# Patient Record
Sex: Female | Born: 1945
Health system: Southern US, Community
[De-identification: ages and names within clinical notes are randomized; demographics above are authoritative.]

## PROBLEM LIST (undated history)

## (undated) DIAGNOSIS — I10 Essential (primary) hypertension: Secondary | ICD-10-CM

## (undated) DIAGNOSIS — E785 Hyperlipidemia, unspecified: Secondary | ICD-10-CM

## (undated) DIAGNOSIS — F419 Anxiety disorder, unspecified: Secondary | ICD-10-CM

## (undated) DIAGNOSIS — I5189 Other ill-defined heart diseases: Secondary | ICD-10-CM

## (undated) HISTORY — DX: Other ill-defined heart diseases: I51.89

## (undated) HISTORY — PX: APPENDECTOMY: SHX54

## (undated) HISTORY — DX: Hyperlipidemia, unspecified: E78.5

## (undated) HISTORY — PX: CATARACT EXTRACTION: SUR2

## (undated) HISTORY — DX: Anxiety disorder, unspecified: F41.9

## (undated) HISTORY — DX: Morbid (severe) obesity due to excess calories: E66.01

---

## 2000-12-28 ENCOUNTER — Other Ambulatory Visit: Admission: RE | Admit: 2000-12-28 | Discharge: 2000-12-28 | Payer: Self-pay | Admitting: Family Medicine

## 2001-09-26 ENCOUNTER — Encounter: Payer: Self-pay | Admitting: Cardiology

## 2002-07-22 ENCOUNTER — Encounter: Payer: Self-pay | Admitting: Cardiology

## 2004-07-07 ENCOUNTER — Ambulatory Visit: Payer: Self-pay | Admitting: Family Medicine

## 2004-07-22 ENCOUNTER — Ambulatory Visit: Payer: Self-pay | Admitting: Urology

## 2004-08-18 HISTORY — PX: ABDOMINAL HYSTERECTOMY: SHX81

## 2004-09-28 ENCOUNTER — Ambulatory Visit: Payer: Self-pay | Admitting: Gynecologic Oncology

## 2005-02-10 ENCOUNTER — Ambulatory Visit: Payer: Self-pay | Admitting: Family Medicine

## 2005-10-10 ENCOUNTER — Ambulatory Visit: Payer: Self-pay | Admitting: Family Medicine

## 2007-01-01 ENCOUNTER — Ambulatory Visit: Payer: Self-pay | Admitting: Family Medicine

## 2008-01-09 ENCOUNTER — Ambulatory Visit: Payer: Self-pay | Admitting: Family Medicine

## 2009-01-25 ENCOUNTER — Ambulatory Visit: Payer: Self-pay | Admitting: Family Medicine

## 2009-03-02 LAB — HM DEXA SCAN

## 2009-03-22 ENCOUNTER — Ambulatory Visit: Payer: Self-pay | Admitting: Family Medicine

## 2009-06-16 ENCOUNTER — Ambulatory Visit: Payer: Self-pay | Admitting: Family Medicine

## 2009-06-17 ENCOUNTER — Ambulatory Visit: Payer: Self-pay | Admitting: Family Medicine

## 2010-04-07 ENCOUNTER — Ambulatory Visit: Payer: Self-pay | Admitting: Family Medicine

## 2013-08-26 LAB — HM COLONOSCOPY

## 2014-03-12 LAB — HEPATIC FUNCTION PANEL
ALK PHOS: 77 U/L (ref 25–125)
ALT: 23 U/L (ref 7–35)
AST: 25 U/L (ref 13–35)

## 2014-03-12 LAB — CBC AND DIFFERENTIAL
HCT: 44 % (ref 36–46)
Hemoglobin: 14.8 g/dL (ref 12.0–16.0)
Neutrophils Absolute: 4 /uL
PLATELETS: 264 10*3/uL (ref 150–399)
WBC: 8 10*3/mL

## 2014-03-12 LAB — LIPID PANEL
Cholesterol: 192 mg/dL (ref 0–200)
HDL: 50 mg/dL (ref 35–70)
LDL CALC: 101 mg/dL
LDl/HDL Ratio: 2
Triglycerides: 207 mg/dL — AB (ref 40–160)

## 2014-03-12 LAB — BASIC METABOLIC PANEL
BUN: 15 mg/dL (ref 4–21)
CREATININE: 1.1 mg/dL (ref 0.5–1.1)
GLUCOSE: 92 mg/dL
Potassium: 4.8 mmol/L (ref 3.4–5.3)
Sodium: 142 mmol/L (ref 137–147)

## 2014-03-12 LAB — TSH: TSH: 1.88 u[IU]/mL (ref 0.41–5.90)

## 2014-07-15 DIAGNOSIS — Z23 Encounter for immunization: Secondary | ICD-10-CM | POA: Diagnosis not present

## 2014-07-15 DIAGNOSIS — Z0000 Encounter for general adult medical examination without abnormal findings: Secondary | ICD-10-CM | POA: Diagnosis not present

## 2014-08-25 ENCOUNTER — Other Ambulatory Visit: Payer: Self-pay | Admitting: Family Medicine

## 2014-08-25 DIAGNOSIS — R011 Cardiac murmur, unspecified: Secondary | ICD-10-CM

## 2014-08-31 ENCOUNTER — Ambulatory Visit
Admission: RE | Admit: 2014-08-31 | Discharge: 2014-08-31 | Disposition: A | Discharge status: Home / Self Care | Payer: Medicare Other | Source: Ambulatory Visit | Attending: Family Medicine | Admitting: Family Medicine

## 2014-08-31 DIAGNOSIS — R011 Cardiac murmur, unspecified: Secondary | ICD-10-CM | POA: Insufficient documentation

## 2014-08-31 NOTE — Progress Notes (Signed)
*  PRELIMINARY RESULTS* Echocardiogram 2D Echocardiogram has been performed.  Latoya Cordova 08/31/2014, 12:25 PM

## 2014-09-01 DIAGNOSIS — R011 Cardiac murmur, unspecified: Secondary | ICD-10-CM | POA: Diagnosis not present

## 2014-09-02 ENCOUNTER — Telehealth: Payer: Self-pay

## 2014-09-02 NOTE — Telephone Encounter (Signed)
Tried calling, no answer and then busy signal-aa

## 2014-09-02 NOTE — Telephone Encounter (Signed)
-----   Message from Maple Hudsonichard L Gilbert Jr., MD sent at 09/02/2014  9:30 AM EDT ----- Mild mitral regurgitation==normal Echo.

## 2014-09-04 NOTE — Telephone Encounter (Signed)
Patient advised of results.

## 2014-09-24 ENCOUNTER — Other Ambulatory Visit: Payer: Self-pay | Admitting: Family Medicine

## 2014-12-03 ENCOUNTER — Other Ambulatory Visit: Payer: Self-pay | Admitting: Family Medicine

## 2014-12-22 ENCOUNTER — Other Ambulatory Visit: Payer: Self-pay

## 2014-12-22 MED ORDER — QUINAPRIL HCL 40 MG PO TABS: 40.0000 mg | ORAL_TABLET | Freq: Every day | ORAL | Status: DC

## 2015-01-02 ENCOUNTER — Other Ambulatory Visit: Payer: Self-pay | Admitting: Family Medicine

## 2015-01-12 DIAGNOSIS — E669 Obesity, unspecified: Secondary | ICD-10-CM | POA: Insufficient documentation

## 2015-01-12 DIAGNOSIS — F411 Generalized anxiety disorder: Secondary | ICD-10-CM | POA: Insufficient documentation

## 2015-01-12 DIAGNOSIS — J309 Allergic rhinitis, unspecified: Secondary | ICD-10-CM | POA: Insufficient documentation

## 2015-01-12 DIAGNOSIS — Z79890 Hormone replacement therapy (postmenopausal): Secondary | ICD-10-CM | POA: Insufficient documentation

## 2015-01-12 DIAGNOSIS — M8580 Other specified disorders of bone density and structure, unspecified site: Secondary | ICD-10-CM | POA: Insufficient documentation

## 2015-01-12 DIAGNOSIS — I10 Essential (primary) hypertension: Secondary | ICD-10-CM | POA: Insufficient documentation

## 2015-01-12 DIAGNOSIS — M797 Fibromyalgia: Secondary | ICD-10-CM | POA: Insufficient documentation

## 2015-01-12 DIAGNOSIS — R011 Cardiac murmur, unspecified: Secondary | ICD-10-CM | POA: Insufficient documentation

## 2015-01-12 DIAGNOSIS — F32A Depression, unspecified: Secondary | ICD-10-CM | POA: Insufficient documentation

## 2015-01-12 DIAGNOSIS — E785 Hyperlipidemia, unspecified: Secondary | ICD-10-CM | POA: Insufficient documentation

## 2015-01-12 DIAGNOSIS — K219 Gastro-esophageal reflux disease without esophagitis: Secondary | ICD-10-CM | POA: Insufficient documentation

## 2015-01-12 DIAGNOSIS — E039 Hypothyroidism, unspecified: Secondary | ICD-10-CM | POA: Insufficient documentation

## 2015-01-12 DIAGNOSIS — F329 Major depressive disorder, single episode, unspecified: Secondary | ICD-10-CM | POA: Insufficient documentation

## 2015-01-12 DIAGNOSIS — M1990 Unspecified osteoarthritis, unspecified site: Secondary | ICD-10-CM | POA: Insufficient documentation

## 2015-01-13 ENCOUNTER — Encounter: Payer: Self-pay | Admitting: Family Medicine

## 2015-01-13 ENCOUNTER — Ambulatory Visit (INDEPENDENT_AMBULATORY_CARE_PROVIDER_SITE_OTHER): Payer: Medicare Other | Admitting: Family Medicine

## 2015-01-13 VITALS — BP 122/80 | HR 92 | Temp 97.3°F | Resp 16 | Wt 207.0 lb

## 2015-01-13 DIAGNOSIS — F411 Generalized anxiety disorder: Secondary | ICD-10-CM | POA: Diagnosis not present

## 2015-01-13 DIAGNOSIS — R51 Headache: Secondary | ICD-10-CM | POA: Diagnosis not present

## 2015-01-13 DIAGNOSIS — I10 Essential (primary) hypertension: Secondary | ICD-10-CM | POA: Diagnosis not present

## 2015-01-13 DIAGNOSIS — E039 Hypothyroidism, unspecified: Secondary | ICD-10-CM | POA: Diagnosis not present

## 2015-01-13 DIAGNOSIS — K219 Gastro-esophageal reflux disease without esophagitis: Secondary | ICD-10-CM | POA: Diagnosis not present

## 2015-01-13 DIAGNOSIS — F329 Major depressive disorder, single episode, unspecified: Secondary | ICD-10-CM

## 2015-01-13 DIAGNOSIS — E785 Hyperlipidemia, unspecified: Secondary | ICD-10-CM | POA: Diagnosis not present

## 2015-01-13 DIAGNOSIS — G8929 Other chronic pain: Secondary | ICD-10-CM

## 2015-01-13 DIAGNOSIS — F32A Depression, unspecified: Secondary | ICD-10-CM

## 2015-01-13 MED ORDER — PAROXETINE HCL 40 MG PO TABS: 40.0000 mg | ORAL_TABLET | Freq: Every day | ORAL | Status: DC

## 2015-01-13 MED ORDER — BUTALBITAL-ASPIRIN-CAFFEINE 50-325-40 MG PO CAPS: 1.0000 | ORAL_CAPSULE | Freq: Two times a day (BID) | ORAL | Status: DC | PRN

## 2015-01-13 MED ORDER — ESOMEPRAZOLE MAGNESIUM 40 MG PO CPDR: 40.0000 mg | DELAYED_RELEASE_CAPSULE | Freq: Every day | ORAL | Status: DC

## 2015-01-13 MED ORDER — LEVOTHYROXINE SODIUM 25 MCG PO TABS: 25.0000 ug | ORAL_TABLET | Freq: Every day | ORAL | Status: DC

## 2015-01-13 MED ORDER — QUINAPRIL HCL 40 MG PO TABS: 40.0000 mg | ORAL_TABLET | Freq: Every day | ORAL | Status: DC

## 2015-01-13 MED ORDER — LORAZEPAM 1 MG PO TABS: 1.0000 mg | ORAL_TABLET | Freq: Every day | ORAL | Status: DC

## 2015-01-13 MED ORDER — HYDROCODONE-ACETAMINOPHEN 5-325 MG PO TABS: 1.0000 | ORAL_TABLET | Freq: Every day | ORAL | Status: DC | PRN

## 2015-01-13 MED ORDER — HYDROCHLOROTHIAZIDE 25 MG PO TABS: 25.0000 mg | ORAL_TABLET | Freq: Every day | ORAL | Status: DC

## 2015-01-13 NOTE — Progress Notes (Signed)
Patient ID: Latoya Cordova, female   DOB: 29-Oct-1945, 69 y.o.   MRN: [id number]    Subjective:  HPI  Hypertension, follow-up:  BP Readings from Last 3 Encounters:  01/13/15 122/80  07/15/14 138/80    She was last seen for hypertension 6 months ago.  BP at that visit was 138/80. Management since that visit includes none. She reports good compliance with treatment. She is not having side effects.  She is not exercising. She is adherent to low salt diet.   Outside blood pressures are not being checked . Patient denies chest pain, chest pressure/discomfort, dyspnea, exertional chest pressure/discomfort, irregular heart beat and palpitations.     Wt Readings from Last 3 Encounters:  01/13/15 207 lb (93.895 kg)  07/15/14 211 lb (95.709 kg)    ------------------------------------------------------------------------  Last routine  labs 03/12/14     Prior to Admission medications   Medication Sig Start Date End Date Taking? Authorizing Provider  butalbital-aspirin-caffeine [facility]) [phone] MG capsule Take by mouth. 07/15/14  Yes Historical Provider, MD  esomeprazole (NEXIUM) 40 MG capsule Take by mouth. 07/02/12  Yes Historical Provider, MD  hydrochlorothiazide (HYDRODIURIL) 25 MG tablet take 1 tablet by mouth once daily 12/03/14  Yes Antolin Belsito Hulen Shouts., MD  HYDROcodone-acetaminophen (NORCO/VICODIN) 5-325 MG tablet Take by mouth. 07/15/14  Yes Historical Provider, MD  levothyroxine (SYNTHROID, LEVOTHROID) 25 MCG tablet take 1 tablet by mouth once daily 01/04/15  Yes Issac Moure Hulen Shouts., MD  LORazepam (ATIVAN) 1 MG tablet take 1 tablet by mouth at bedtime 01/04/15  Yes Maple Hudson., MD  PARoxetine (PAXIL) 40 MG tablet take 1 tablet by mouth once daily 01/04/15  Yes Orlanda Frankum Hulen Shouts., MD  quinapril (ACCUPRIL) 40 MG tablet Take 1 tablet (40 mg total) by mouth daily. 12/22/14  Yes Rayne Cowdrey Hulen Shouts., MD    Patient Active Problem List   Diagnosis Date Noted  .  Allergic rhinitis 01/12/2015  . Clinical depression 01/12/2015  . Essential (primary) hypertension 01/12/2015  . Anxiety, generalized 01/12/2015  . Fibrositis 01/12/2015  . Acid reflux 01/12/2015  . Need for prophylactic hormone replacement therapy (postmenopausal) 01/12/2015  . HLD (hyperlipidemia) 01/12/2015  . Adult hypothyroidism 01/12/2015  . Arthritis, degenerative 01/12/2015  . Cardiac murmur 01/12/2015  . Osteopenia 01/12/2015  . Adiposity 01/12/2015    History reviewed. No pertinent past medical history.  Social History   Social History  . Marital Status: Widowed    Spouse Name: N/A  . Number of Children: N/A  . Years of Education: N/A   Occupational History  . Not on file.   Social History Main Topics  . Smoking status: Never Smoker   . Smokeless tobacco: Not on file  . Alcohol Use: No  . Drug Use: No  . Sexual Activity: Not on file   Other Topics Concern  . Not on file   Social History Narrative    Allergies  Allergen Reactions  . Sulfa Antibiotics     Per Mother  . Lodine  [Etodolac] Rash    Abdominal rash.    Review of Systems  Constitutional: Negative.   HENT: Negative.   Eyes: Negative.   Respiratory: Negative.   Cardiovascular: Negative.   Gastrointestinal: Negative.   Genitourinary: Negative.   Musculoskeletal: Negative.   Skin: Negative.   Neurological: Negative.   Endo/Heme/Allergies: Negative.   Psychiatric/Behavioral: Negative.     Immunization History  Administered Date(s) Administered  . Influenza-Unspecified 02/24/2013  . Pneumococcal Conjugate-13 08/08/2013  .  Pneumococcal Polysaccharide-23 05/24/2011  . Tdap 12/13/2006   Objective:  BP 122/80 mmHg  Pulse 92  Temp(Src) 97.3 F (36.3 C) (Oral)  Resp 16  Wt 207 lb (93.895 kg)  Physical Exam  Constitutional: She is oriented to person, place, and time and well-developed, well-nourished, and in no distress.  HENT:  Head: Normocephalic and atraumatic.  Right Ear:  External ear normal.  Left Ear: External ear normal.  Nose: Nose normal.  Eyes: Conjunctivae are normal.  Neck: Neck supple.  Cardiovascular: Normal rate, regular rhythm and normal heart sounds.   Pulmonary/Chest: Effort normal and breath sounds normal.  Abdominal: Soft.  Neurological: She is alert and oriented to person, place, and time.  Skin: Skin is warm and dry.  Psychiatric: Mood, memory, affect and judgment normal.    Lab Results  Component Value Date   WBC 8.0 03/12/2014   HGB 14.8 03/12/2014   HCT 44 03/12/2014   PLT 264 03/12/2014   CHOL 192 03/12/2014   TRIG 207* 03/12/2014   HDL 50 03/12/2014   LDLCALC 101 03/12/2014   TSH 1.88 03/12/2014    CMP     Component Value Date/Time   NA 142 03/12/2014   K 4.8 03/12/2014   BUN 15 03/12/2014   CREATININE 1.1 03/12/2014   AST 25 03/12/2014   ALT 23 03/12/2014   ALKPHOS 77 03/12/2014    Assessment and Plan :  1. Essential (primary) hypertension  - quinapril (ACCUPRIL) 40 MG tablet; Take 1 tablet (40 mg total) by mouth daily.  Dispense: 30 tablet; Refill: 12 - hydrochlorothiazide (HYDRODIURIL) 25 MG tablet; Take 1 tablet (25 mg total) by mouth daily.  Dispense: 30 tablet; Refill: 12 - CBC with Differential/Platelet - TSH  2. Hypothyroidism, unspecified hypothyroidism type  - levothyroxine (SYNTHROID, LEVOTHROID) 25 MCG tablet; Take 1 tablet (25 mcg total) by mouth daily.  Dispense: 30 tablet; Refill: 12  3. Clinical depression  - PARoxetine (PAXIL) 40 MG tablet; Take 1 tablet (40 mg total) by mouth daily.  Dispense: 30 tablet; Refill: 12  4. Anxiety, generalized  - LORazepam (ATIVAN) 1 MG tablet; Take 1 tablet (1 mg total) by mouth at bedtime.  Dispense: 30 tablet; Refill: 5  5. HLD (hyperlipidemia)  - Lipid Panel With LDL/HDL Ratio - Comprehensive metabolic panel  6. Gastroesophageal reflux disease, esophagitis presence not specified  - esomeprazole (NEXIUM) 40 MG capsule; Take 1 capsule (40 mg total)  by mouth daily.  Dispense: 30 capsule; Refill: 12  7. Chronic nonintractable headache, unspecified headache type  - butalbital-aspirin-caffeine (FIORINAL) 50-325-40 MG capsule; Take 1 capsule by mouth 2 (two) times daily as needed for headache.  Dispense: 55 capsule; Refill: 5 - HYDROcodone-acetaminophen (NORCO/VICODIN) 5-325 MG tablet; Take 1-2 tablets by mouth daily as needed for moderate pain.  Dispense: 75 tablet; Refill: 0  I have done the exam and reviewed the above chart and it is accurate to the best of my knowledge.   Julieanne Mansonichard Asiana Benninger MD [facility] 01/13/2015 3:01 PM

## 2015-01-19 DIAGNOSIS — I10 Essential (primary) hypertension: Secondary | ICD-10-CM | POA: Diagnosis not present

## 2015-01-19 DIAGNOSIS — E785 Hyperlipidemia, unspecified: Secondary | ICD-10-CM | POA: Diagnosis not present

## 2015-01-20 LAB — COMPREHENSIVE METABOLIC PANEL
ALK PHOS: 79 IU/L (ref 39–117)
ALT: 27 IU/L (ref 0–32)
AST: 32 IU/L (ref 0–40)
Albumin/Globulin Ratio: 1.3 (ref 1.1–2.5)
Albumin: 4.7 g/dL (ref 3.5–4.8)
BILIRUBIN TOTAL: 0.5 mg/dL (ref 0.0–1.2)
BUN/Creatinine Ratio: 20 (ref 11–26)
BUN: 18 mg/dL (ref 8–27)
CHLORIDE: 94 mmol/L — AB (ref 97–106)
CO2: 23 mmol/L (ref 18–29)
CREATININE: 0.89 mg/dL (ref 0.57–1.00)
Calcium: 9.7 mg/dL (ref 8.7–10.3)
GFR calc Af Amer: 76 mL/min/{1.73_m2} (ref >59)
GFR calc non Af Amer: 66 mL/min/{1.73_m2} (ref >59)
GLUCOSE: 103 mg/dL — AB (ref 65–99)
Globulin, Total: 3.5 g/dL (ref 1.5–4.5)
Potassium: 4.6 mmol/L (ref 3.5–5.2)
Sodium: 139 mmol/L (ref 136–144)
TOTAL PROTEIN: 8.2 g/dL (ref 6.0–8.5)

## 2015-01-20 LAB — CBC WITH DIFFERENTIAL/PLATELET
Basophils Absolute: 0 10*3/uL (ref 0.0–0.2)
Basos: 0 %
EOS (ABSOLUTE): 0.2 10*3/uL (ref 0.0–0.4)
EOS: 2 %
HEMATOCRIT: 43.8 % (ref 34.0–46.6)
Hemoglobin: 15.1 g/dL (ref 11.1–15.9)
IMMATURE GRANS (ABS): 0 10*3/uL (ref 0.0–0.1)
IMMATURE GRANULOCYTES: 0 %
LYMPHS: 48 %
Lymphocytes Absolute: 5.3 10*3/uL — ABNORMAL HIGH (ref 0.7–3.1)
MCH: 31.9 pg (ref 26.6–33.0)
MCHC: 34.5 g/dL (ref 31.5–35.7)
MCV: 92 fL (ref 79–97)
Monocytes Absolute: 0.7 10*3/uL (ref 0.1–0.9)
Monocytes: 6 %
NEUTROS PCT: 44 %
Neutrophils Absolute: 5 10*3/uL (ref 1.4–7.0)
PLATELETS: 289 10*3/uL (ref 150–379)
RBC: 4.74 x10E6/uL (ref 3.77–5.28)
RDW: 13.4 % (ref 12.3–15.4)
WBC: 11.3 10*3/uL — AB (ref 3.4–10.8)

## 2015-01-20 LAB — LIPID PANEL WITH LDL/HDL RATIO
Cholesterol, Total: 196 mg/dL (ref 100–199)
HDL: 44 mg/dL (ref >39)
LDL Calculated: 106 mg/dL — ABNORMAL HIGH (ref 0–99)
LDl/HDL Ratio: 2.4 ratio units (ref 0.0–3.2)
TRIGLYCERIDES: 229 mg/dL — AB (ref 0–149)
VLDL CHOLESTEROL CAL: 46 mg/dL — AB (ref 5–40)

## 2015-01-20 LAB — TSH: TSH: 1.44 u[IU]/mL (ref 0.450–4.500)

## 2015-04-07 ENCOUNTER — Emergency Department: Payer: PPO

## 2015-04-07 ENCOUNTER — Other Ambulatory Visit: Payer: Self-pay

## 2015-04-07 ENCOUNTER — Encounter: Payer: Self-pay | Admitting: Urgent Care

## 2015-04-07 ENCOUNTER — Inpatient Hospital Stay
Admission: EM | Admit: 2015-04-07 | Discharge: 2015-04-11 | DRG: 193 | Disposition: A | Discharge status: Home / Self Care | Payer: PPO | Attending: Internal Medicine | Admitting: Internal Medicine

## 2015-04-07 DIAGNOSIS — R0602 Shortness of breath: Secondary | ICD-10-CM | POA: Insufficient documentation

## 2015-04-07 DIAGNOSIS — Z888 Allergy status to other drugs, medicaments and biological substances status: Secondary | ICD-10-CM | POA: Diagnosis not present

## 2015-04-07 DIAGNOSIS — J9621 Acute and chronic respiratory failure with hypoxia: Secondary | ICD-10-CM | POA: Diagnosis not present

## 2015-04-07 DIAGNOSIS — I10 Essential (primary) hypertension: Secondary | ICD-10-CM | POA: Diagnosis present

## 2015-04-07 DIAGNOSIS — M797 Fibromyalgia: Secondary | ICD-10-CM | POA: Diagnosis not present

## 2015-04-07 DIAGNOSIS — I509 Heart failure, unspecified: Secondary | ICD-10-CM | POA: Diagnosis not present

## 2015-04-07 DIAGNOSIS — E871 Hypo-osmolality and hyponatremia: Secondary | ICD-10-CM | POA: Diagnosis present

## 2015-04-07 DIAGNOSIS — I5031 Acute diastolic (congestive) heart failure: Secondary | ICD-10-CM | POA: Diagnosis not present

## 2015-04-07 DIAGNOSIS — J9601 Acute respiratory failure with hypoxia: Secondary | ICD-10-CM | POA: Diagnosis not present

## 2015-04-07 DIAGNOSIS — Z6837 Body mass index (BMI) 37.0-37.9, adult: Secondary | ICD-10-CM | POA: Diagnosis not present

## 2015-04-07 DIAGNOSIS — R0789 Other chest pain: Secondary | ICD-10-CM | POA: Diagnosis not present

## 2015-04-07 DIAGNOSIS — Z79899 Other long term (current) drug therapy: Secondary | ICD-10-CM | POA: Diagnosis not present

## 2015-04-07 DIAGNOSIS — R0600 Dyspnea, unspecified: Secondary | ICD-10-CM | POA: Diagnosis not present

## 2015-04-07 DIAGNOSIS — Z803 Family history of malignant neoplasm of breast: Secondary | ICD-10-CM | POA: Diagnosis not present

## 2015-04-07 DIAGNOSIS — Z8249 Family history of ischemic heart disease and other diseases of the circulatory system: Secondary | ICD-10-CM

## 2015-04-07 DIAGNOSIS — Z882 Allergy status to sulfonamides status: Secondary | ICD-10-CM

## 2015-04-07 DIAGNOSIS — E039 Hypothyroidism, unspecified: Secondary | ICD-10-CM | POA: Diagnosis not present

## 2015-04-07 DIAGNOSIS — Z90710 Acquired absence of both cervix and uterus: Secondary | ICD-10-CM | POA: Diagnosis not present

## 2015-04-07 DIAGNOSIS — F329 Major depressive disorder, single episode, unspecified: Secondary | ICD-10-CM | POA: Diagnosis not present

## 2015-04-07 DIAGNOSIS — R0902 Hypoxemia: Secondary | ICD-10-CM | POA: Diagnosis not present

## 2015-04-07 DIAGNOSIS — F411 Generalized anxiety disorder: Secondary | ICD-10-CM | POA: Diagnosis present

## 2015-04-07 DIAGNOSIS — Z9049 Acquired absence of other specified parts of digestive tract: Secondary | ICD-10-CM

## 2015-04-07 DIAGNOSIS — J189 Pneumonia, unspecified organism: Principal | ICD-10-CM | POA: Diagnosis present

## 2015-04-07 DIAGNOSIS — K210 Gastro-esophageal reflux disease with esophagitis: Secondary | ICD-10-CM | POA: Diagnosis present

## 2015-04-07 DIAGNOSIS — R079 Chest pain, unspecified: Secondary | ICD-10-CM

## 2015-04-07 DIAGNOSIS — R51 Headache: Secondary | ICD-10-CM | POA: Diagnosis not present

## 2015-04-07 DIAGNOSIS — F418 Other specified anxiety disorders: Secondary | ICD-10-CM | POA: Diagnosis not present

## 2015-04-07 DIAGNOSIS — K219 Gastro-esophageal reflux disease without esophagitis: Secondary | ICD-10-CM | POA: Diagnosis not present

## 2015-04-07 HISTORY — DX: Essential (primary) hypertension: I10

## 2015-04-07 LAB — BASIC METABOLIC PANEL
Anion gap: 10 (ref 5–15)
BUN: 13 mg/dL (ref 6–20)
CO2: 27 mmol/L (ref 22–32)
CREATININE: 1.1 mg/dL — AB (ref 0.44–1.00)
Calcium: 9 mg/dL (ref 8.9–10.3)
Chloride: 101 mmol/L (ref 101–111)
GFR, EST AFRICAN AMERICAN: 58 mL/min — AB (ref >60)
GFR, EST NON AFRICAN AMERICAN: 50 mL/min — AB (ref >60)
Glucose, Bld: 139 mg/dL — ABNORMAL HIGH (ref 65–99)
POTASSIUM: 3.7 mmol/L (ref 3.5–5.1)
SODIUM: 138 mmol/L (ref 135–145)

## 2015-04-07 LAB — CBC
HEMATOCRIT: 44.9 % (ref 35.0–47.0)
Hemoglobin: 14.8 g/dL (ref 12.0–16.0)
MCH: 29.8 pg (ref 26.0–34.0)
MCHC: 32.9 g/dL (ref 32.0–36.0)
MCV: 90.3 fL (ref 80.0–100.0)
PLATELETS: 256 10*3/uL (ref 150–440)
RBC: 4.97 MIL/uL (ref 3.80–5.20)
RDW: 12.9 % (ref 11.5–14.5)
WBC: 16.8 10*3/uL — AB (ref 3.6–11.0)

## 2015-04-07 LAB — TROPONIN I

## 2015-04-07 MED ORDER — IPRATROPIUM-ALBUTEROL 0.5-2.5 (3) MG/3ML IN SOLN
3.0000 mL | Freq: Once | RESPIRATORY_TRACT | Status: AC
  Administered 2015-04-08: 3 mL via RESPIRATORY_TRACT
  Filled 2015-04-07 (×2): qty 3

## 2015-04-07 NOTE — ED Provider Notes (Signed)
[facility] Emergency Department Provider Note  ____________________________________________  Time seen: 11:45 PM  I have reviewed the triage vital signs and the nursing notes.   HISTORY  Chief Complaint Chest Pain      HPI Latoya Cordova is a 70 y.o. female presents with acute onset of central chest pain and bilateral shoulder pain at 8:30 PM tonight. Patient also admits to shortness of breath with onset of chest pain. Patient denies any diaphoresis no cough vomiting or nausea.     History reviewed. No pertinent past medical history.  Patient Active Problem List   Diagnosis Date Noted  . Allergic rhinitis 01/12/2015  . Clinical depression 01/12/2015  . Essential (primary) hypertension 01/12/2015  . Anxiety, generalized 01/12/2015  . Fibrositis 01/12/2015  . Acid reflux 01/12/2015  . Need for prophylactic hormone replacement therapy (postmenopausal) 01/12/2015  . HLD (hyperlipidemia) 01/12/2015  . Adult hypothyroidism 01/12/2015  . Arthritis, degenerative 01/12/2015  . Cardiac murmur 01/12/2015  . Osteopenia 01/12/2015  . Adiposity 01/12/2015    Past Surgical History  Procedure Laterality Date  . Abdominal hysterectomy  [id number]  . Appendectomy      Current Outpatient Rx  Name  Route  Sig  Dispense  Refill  . butalbital-aspirin-caffeine (FIORINAL) 50-325-40 MG capsule   Oral   Take 1 capsule by mouth 2 (two) times daily as needed for headache.   55 capsule   5   . esomeprazole (NEXIUM) 40 MG capsule   Oral   Take 1 capsule (40 mg total) by mouth daily.   30 capsule   12   . hydrochlorothiazide (HYDRODIURIL) 25 MG tablet   Oral   Take 1 tablet (25 mg total) by mouth daily.   30 tablet   12     SAVINGS FOR NON-COVERED DRUGS -- [id number], PCN:  ...   . HYDROcodone-acetaminophen (NORCO/VICODIN) 5-325 MG tablet   Oral   Take 1-2 tablets by mouth daily as needed for moderate pain.   75 tablet   0   . levothyroxine  (SYNTHROID, LEVOTHROID) 25 MCG tablet   Oral   Take 1 tablet (25 mcg total) by mouth daily.   30 tablet   12   . LORazepam (ATIVAN) 1 MG tablet   Oral   Take 1 tablet (1 mg total) by mouth at bedtime.   30 tablet   5   . PARoxetine (PAXIL) 40 MG tablet   Oral   Take 1 tablet (40 mg total) by mouth daily.   30 tablet   12   . quinapril (ACCUPRIL) 40 MG tablet   Oral   Take 1 tablet (40 mg total) by mouth daily.   30 tablet   12     Allergies Sulfa antibiotics and Lodine   Family History  Problem Relation Age of Onset  . Ulcers Father   . Heart attack Paternal Grandfather   . Hypertension Mother   . Congestive Heart Failure Mother   . Breast cancer Maternal Aunt     Social History Social History  Substance Use Topics  . Smoking status: Never Smoker   . Smokeless tobacco: None  . Alcohol Use: No    Review of Systems  Constitutional: Negative for fever. Eyes: Negative for visual changes. ENT: Negative for sore throat. Cardiovascular:Positive for chest pain. Respiratory: positiveor shortness of breath. Gastrointestinal: Negative for abdominal pain, vomiting and diarrhea. Genitourinary: Negative for dysuria. Musculoskeletal: Negative for back pain. Skin: Negative for rash. Neurological: Negative for headaches, focal weakness  or numbness.   10-point ROS otherwise negative.  ____________________________________________   PHYSICAL EXAM:  VITAL SIGNS: ED Triage Vitals  Enc Vitals Group     BP 04/07/15 2226 121/64 mmHg     Pulse Rate 04/07/15 2226 116     Resp 04/07/15 2226 20     Temp 04/07/15 2226 98.1 F (36.7 C)     Temp Source 04/07/15 2226 Oral     SpO2 04/07/15 2226 92 %     Weight 04/07/15 2226 185 lb (83.915 kg)     Height 04/07/15 2226 5\' 2"  (1.575 m)     Head Cir --      Peak Flow --      Pain Score 04/07/15 2225 7     Pain Loc --      Pain Edu? --      Excl. in GC? --      Constitutional: AlertParent discomfort Conjunctivae are  normal. PERRL. Normal extraocular movements. ENT   Head: Normocephalic and atraumatic.   Nose: No congestion/rhinnorhea.   Mouth/Throat: Mucous membranes are moist.   Neck: No stridor. Hematological/Lymphatic/Immunilogical: No cervical lymphadenopathy. Cardiovascular: Normal rate, regular rhythm. Normal and symmetric distal pulses are present in all extremities. No murmurs, rubs, or gallops. Respiratory: Normal respiratory effort without tachypnea nor retractions. Breath sounds are clear and equal bilaterally. No wheezes/rales/rhonchi. Gastrointestinal: Soft and nontender. No distention. There is no CVA tenderness. Genitourinary: deferred Musculoskeletal: Nontender with normal range of motion in all extremities. No joint effusions.  No lower extremity tenderness nor edema. Neurologic:  Normal speech and language. No gross focal neurologic deficits are appreciated. Speech is normal.  Skin:  Skin is warm, dry and intact. No rash noted. Psychiatric: Mood and affect are normal. Speech and behavior are normal. Patient exhibits appropriate insight and judgment.  ____________________________________________    LABS (pertinent positives/negatives)  Labs Reviewed  BASIC METABOLIC PANEL - Abnormal; Notable for the following:    Glucose, Bld 139 (*)    Creatinine, Ser 1.10 (*)    GFR calc non Af Amer 50 (*)    GFR calc Af Amer 58 (*)    All other components within normal limits  CBC - Abnormal; Notable for the following:    WBC 16.8 (*)    All other components within normal limits  TROPONIN I     ____________________________________________   EKG  ED ECG REPORT I, Hassel Uphoff, [place] N, the attending physician, personally viewed and interpreted this ECG.   Date: 04/07/2015  EKG Time: 10:27 PM  Rate: 121  Rhythm: Sinus tachycardia  Axis: None  Intervals: Normal  ST&T Change:None   ____________________________________________    RADIOLOGY     CT Angio Chest PE  W/Cm &/Or Wo Cm (Final result) Result time: 04/08/15 01:36:01   Final result by Rad Results In Interface (04/08/15 01:36:01)   Narrative:   CLINICAL DATA: 70 year old female with dyspnea and chest pain  EXAM: CT ANGIOGRAPHY CHEST WITH CONTRAST  TECHNIQUE: Multidetector CT imaging of the chest was performed using the standard protocol during bolus administration of intravenous contrast. Multiplanar CT image reconstructions and MIPs were obtained to evaluate the vascular anatomy.  CONTRAST: 100mL OMNIPAQUE IOHEXOL 350 MG/ML SOLN  COMPARISON: Radiograph dated 04/07/2015  FINDINGS: Evaluation of this exam is limited due to respiratory motion artifact. Evaluation is also limited due to streak artifact caused by patient's arms.  There is mild emphysematous changes of the lungs. There is patchy areas of ground-glass opacities with mosaic appearance of the lungs compatible  with small airways versus small vessel disease. Pneumonia is less likely but not excluded. Clinical correlation is recommended. There is no focal consolidation, pleural effusion, or pneumothorax. The central airways are patent.  The thoracic aorta appears unremarkable. Evaluation of the pulmonary arteries is limited due to suboptimal visualization of the peripheral branches. No central pulmonary artery embolus identified. Top-normal cardiac size. No pericardial effusion. The esophagus is grossly unremarkable. No thyroid nodule.  There is no axillary adenopathy. The chest wall soft tissues appear unremarkable. There is degenerative changes of the spine. No acute fracture.  The visualized upper abdomen appears unremarkable.  Review of the MIP images confirms the above findings.  IMPRESSION: No definite CT evidence of central pulmonary embolus.  Diffuse airspace haziness with areas of air trapping likely within small airways versus small vessel disease. Clinical correlation  is recommended.   Electronically Signed By: Elgie Collard M.D. On: 04/08/2015 01:36          DG Chest 2 View (Final result) Result time: 04/07/15 23:27:04   Final result by Rad Results In Interface (04/07/15 23:27:04)   Narrative:   CLINICAL DATA: Chest pain, shortness of breath and palpitations. Onset this evening.  EXAM: CHEST 2 VIEW  COMPARISON: 06/17/2009  FINDINGS: Progressive cardiomegaly. Unchanged tortuosity of the thoracic aorta. Indistinctness of perihilar vasculature suggesting pulmonary edema. Bibasilar opacities may be atelectasis or vascular in etiology. No pleural effusion. No pneumothorax. No acute osseous abnormalities are seen.  IMPRESSION: Increase cardiomegaly with probable pulmonary edema. Findings suggest mild CHF.  Bibasilar opacities may be atelectasis or vascular in etiology.   Electronically Signed By: Rubye Oaks M.D. On: 04/07/2015 23:27          INITIAL IMPRESSION / ASSESSMENT AND PLAN / ED COURSE  Pertinent labs & imaging results that were available during my care of the patient were reviewed by me and considered in my medical decision making (see chart for details).  Patient received a DuoNeb nebulized breathing treatment as well as morphine and Zofran in the emergency department.  ____________________________________________   FINAL CLINICAL IMPRESSION(S) / ED DIAGNOSES  Final diagnoses:  Chest pain, unspecified chest pain type      Darci Current, MD 04/09/15 [phone]

## 2015-04-07 NOTE — ED Notes (Addendum)
Pt wheeled to room, but ambulatory to bed. Pt states pain started in back and shoulders, then came to chest. Pt is SOB with movement. Denies N&V, diaphoresis. Pt states she is only dizzy when she bends over, but otherwise no dizziness. Pt states pain began at 8:30p, states she was watching tv, denies doing anything out of the ordinary, states pain in abdomen earlier today. Pt states she took an Ativan about 7:30p and took 3 gas relief gel pills about 8p.

## 2015-04-07 NOTE — ED Notes (Addendum)
Patient presents with c/o pain that started in bilateral shoulders around 2030 tonight. Pain has progressed to retrosternal area at present. (+) SOB reported. Denies N/V and diaphoresis. Patient took an Ativan and 3 gas pills in efforts to control symptoms; reports that interventions were non-effective.

## 2015-04-08 ENCOUNTER — Telehealth: Payer: Self-pay | Admitting: Family Medicine

## 2015-04-08 ENCOUNTER — Inpatient Hospital Stay (HOSPITAL_COMMUNITY)
Admit: 2015-04-08 | Discharge: 2015-04-08 | Disposition: A | Discharge status: Home / Self Care | Payer: PPO | Attending: Physician Assistant | Admitting: Physician Assistant

## 2015-04-08 ENCOUNTER — Emergency Department: Payer: PPO

## 2015-04-08 ENCOUNTER — Encounter: Payer: Self-pay | Admitting: Radiology

## 2015-04-08 ENCOUNTER — Telehealth: Payer: Self-pay

## 2015-04-08 DIAGNOSIS — I509 Heart failure, unspecified: Secondary | ICD-10-CM | POA: Diagnosis not present

## 2015-04-08 DIAGNOSIS — R0789 Other chest pain: Secondary | ICD-10-CM

## 2015-04-08 DIAGNOSIS — F329 Major depressive disorder, single episode, unspecified: Secondary | ICD-10-CM | POA: Diagnosis not present

## 2015-04-08 DIAGNOSIS — Z803 Family history of malignant neoplasm of breast: Secondary | ICD-10-CM | POA: Diagnosis not present

## 2015-04-08 DIAGNOSIS — E871 Hypo-osmolality and hyponatremia: Secondary | ICD-10-CM | POA: Diagnosis not present

## 2015-04-08 DIAGNOSIS — Z6837 Body mass index (BMI) 37.0-37.9, adult: Secondary | ICD-10-CM | POA: Diagnosis not present

## 2015-04-08 DIAGNOSIS — R0902 Hypoxemia: Secondary | ICD-10-CM | POA: Diagnosis not present

## 2015-04-08 DIAGNOSIS — I10 Essential (primary) hypertension: Secondary | ICD-10-CM | POA: Diagnosis not present

## 2015-04-08 DIAGNOSIS — K219 Gastro-esophageal reflux disease without esophagitis: Secondary | ICD-10-CM | POA: Diagnosis not present

## 2015-04-08 DIAGNOSIS — R0602 Shortness of breath: Secondary | ICD-10-CM | POA: Diagnosis not present

## 2015-04-08 DIAGNOSIS — J189 Pneumonia, unspecified organism: Secondary | ICD-10-CM | POA: Diagnosis not present

## 2015-04-08 DIAGNOSIS — Z8249 Family history of ischemic heart disease and other diseases of the circulatory system: Secondary | ICD-10-CM | POA: Diagnosis not present

## 2015-04-08 DIAGNOSIS — M797 Fibromyalgia: Secondary | ICD-10-CM | POA: Insufficient documentation

## 2015-04-08 DIAGNOSIS — R0600 Dyspnea, unspecified: Secondary | ICD-10-CM | POA: Diagnosis not present

## 2015-04-08 DIAGNOSIS — F418 Other specified anxiety disorders: Secondary | ICD-10-CM | POA: Diagnosis not present

## 2015-04-08 DIAGNOSIS — E039 Hypothyroidism, unspecified: Secondary | ICD-10-CM | POA: Diagnosis not present

## 2015-04-08 DIAGNOSIS — I5031 Acute diastolic (congestive) heart failure: Secondary | ICD-10-CM

## 2015-04-08 DIAGNOSIS — R079 Chest pain, unspecified: Secondary | ICD-10-CM | POA: Diagnosis not present

## 2015-04-08 DIAGNOSIS — K210 Gastro-esophageal reflux disease with esophagitis: Secondary | ICD-10-CM | POA: Diagnosis not present

## 2015-04-08 DIAGNOSIS — J9601 Acute respiratory failure with hypoxia: Secondary | ICD-10-CM | POA: Diagnosis not present

## 2015-04-08 DIAGNOSIS — Z90710 Acquired absence of both cervix and uterus: Secondary | ICD-10-CM | POA: Diagnosis not present

## 2015-04-08 DIAGNOSIS — F411 Generalized anxiety disorder: Secondary | ICD-10-CM | POA: Diagnosis not present

## 2015-04-08 DIAGNOSIS — Z888 Allergy status to other drugs, medicaments and biological substances status: Secondary | ICD-10-CM | POA: Diagnosis not present

## 2015-04-08 DIAGNOSIS — R51 Headache: Secondary | ICD-10-CM | POA: Diagnosis not present

## 2015-04-08 DIAGNOSIS — Z9049 Acquired absence of other specified parts of digestive tract: Secondary | ICD-10-CM | POA: Diagnosis not present

## 2015-04-08 DIAGNOSIS — J9621 Acute and chronic respiratory failure with hypoxia: Secondary | ICD-10-CM | POA: Diagnosis not present

## 2015-04-08 DIAGNOSIS — Z79899 Other long term (current) drug therapy: Secondary | ICD-10-CM | POA: Diagnosis not present

## 2015-04-08 DIAGNOSIS — Z882 Allergy status to sulfonamides status: Secondary | ICD-10-CM | POA: Diagnosis not present

## 2015-04-08 LAB — BRAIN NATRIURETIC PEPTIDE: B NATRIURETIC PEPTIDE 5: 63 pg/mL (ref 0.0–100.0)

## 2015-04-08 LAB — TSH: TSH: 1.635 u[IU]/mL (ref 0.350–4.500)

## 2015-04-08 LAB — TROPONIN I

## 2015-04-08 MED ORDER — ASPIRIN EC 325 MG PO TBEC
325.0000 mg | DELAYED_RELEASE_TABLET | Freq: Once | ORAL | Status: AC
  Administered 2015-04-08: 325 mg via ORAL
  Filled 2015-04-08: qty 1

## 2015-04-08 MED ORDER — LEVOFLOXACIN 750 MG PO TABS
750.0000 mg | ORAL_TABLET | Freq: Every day | ORAL | Status: DC
  Administered 2015-04-08 – 2015-04-11 (×4): 750 mg via ORAL
  Filled 2015-04-08 (×4): qty 1

## 2015-04-08 MED ORDER — FUROSEMIDE 10 MG/ML IJ SOLN: 20.0000 mg | Freq: Once | INTRAMUSCULAR | Status: DC

## 2015-04-08 MED ORDER — PANTOPRAZOLE SODIUM 40 MG PO TBEC
40.0000 mg | DELAYED_RELEASE_TABLET | Freq: Every day | ORAL | Status: DC
  Administered 2015-04-08 – 2015-04-11 (×4): 40 mg via ORAL
  Filled 2015-04-08 (×4): qty 1

## 2015-04-08 MED ORDER — POTASSIUM CHLORIDE CRYS ER 20 MEQ PO TBCR
40.0000 meq | EXTENDED_RELEASE_TABLET | Freq: Once | ORAL | Status: AC
  Administered 2015-04-08: 40 meq via ORAL
  Filled 2015-04-08: qty 2

## 2015-04-08 MED ORDER — IOHEXOL 350 MG/ML SOLN
100.0000 mL | Freq: Once | INTRAVENOUS | Status: AC | PRN
  Administered 2015-04-08: 100 mL via INTRAVENOUS

## 2015-04-08 MED ORDER — SIMETHICONE 80 MG PO CHEW
80.0000 mg | CHEWABLE_TABLET | Freq: Every day | ORAL | Status: DC | PRN
  Filled 2015-04-08: qty 1

## 2015-04-08 MED ORDER — SODIUM CHLORIDE 0.9 % IJ SOLN
3.0000 mL | Freq: Two times a day (BID) | INTRAMUSCULAR | Status: DC
  Administered 2015-04-08 (×2): 3 mL via INTRAVENOUS

## 2015-04-08 MED ORDER — LORAZEPAM 1 MG PO TABS
1.0000 mg | ORAL_TABLET | Freq: Every day | ORAL | Status: DC
  Administered 2015-04-08 – 2015-04-10 (×3): 1 mg via ORAL
  Filled 2015-04-08 (×3): qty 1

## 2015-04-08 MED ORDER — FUROSEMIDE 10 MG/ML IJ SOLN
40.0000 mg | Freq: Two times a day (BID) | INTRAMUSCULAR | Status: DC
  Filled 2015-04-08: qty 4

## 2015-04-08 MED ORDER — MORPHINE SULFATE (PF) 2 MG/ML IV SOLN: 2.0000 mg | INTRAVENOUS | Status: DC | PRN

## 2015-04-08 MED ORDER — LORAZEPAM 2 MG/ML IJ SOLN
1.0000 mg | Freq: Once | INTRAMUSCULAR | Status: AC
  Administered 2015-04-08: 1 mg via INTRAVENOUS
  Filled 2015-04-08: qty 1

## 2015-04-08 MED ORDER — NITROGLYCERIN 0.4 MG SL SUBL
0.4000 mg | SUBLINGUAL_TABLET | SUBLINGUAL | Status: DC | PRN
  Administered 2015-04-08: 0.4 mg via SUBLINGUAL
  Filled 2015-04-08: qty 1

## 2015-04-08 MED ORDER — OXYCODONE HCL 5 MG PO TABS
5.0000 mg | ORAL_TABLET | ORAL | Status: DC | PRN
  Administered 2015-04-08 – 2015-04-11 (×8): 5 mg via ORAL
  Filled 2015-04-08 (×8): qty 1

## 2015-04-08 MED ORDER — IPRATROPIUM-ALBUTEROL 0.5-2.5 (3) MG/3ML IN SOLN
RESPIRATORY_TRACT | Status: AC
  Filled 2015-04-08: qty 3

## 2015-04-08 MED ORDER — PAROXETINE HCL 20 MG PO TABS
40.0000 mg | ORAL_TABLET | Freq: Every day | ORAL | Status: DC
  Administered 2015-04-08 – 2015-04-11 (×4): 40 mg via ORAL
  Filled 2015-04-08 (×4): qty 2

## 2015-04-08 MED ORDER — ATORVASTATIN CALCIUM 20 MG PO TABS
40.0000 mg | ORAL_TABLET | Freq: Every day | ORAL | Status: DC
  Administered 2015-04-08 – 2015-04-10 (×3): 40 mg via ORAL
  Filled 2015-04-08 (×3): qty 2

## 2015-04-08 MED ORDER — SODIUM CHLORIDE 0.9 % IJ SOLN
3.0000 mL | INTRAMUSCULAR | Status: DC | PRN
  Administered 2015-04-10: 3 mL via INTRAVENOUS
  Filled 2015-04-08: qty 10

## 2015-04-08 MED ORDER — ACETAMINOPHEN 325 MG PO TABS
650.0000 mg | ORAL_TABLET | Freq: Four times a day (QID) | ORAL | Status: DC | PRN
  Administered 2015-04-08: 650 mg via ORAL
  Filled 2015-04-08: qty 2

## 2015-04-08 MED ORDER — ACETAMINOPHEN 650 MG RE SUPP: 650.0000 mg | Freq: Four times a day (QID) | RECTAL | Status: DC | PRN

## 2015-04-08 MED ORDER — FUROSEMIDE 10 MG/ML IJ SOLN
40.0000 mg | Freq: Once | INTRAMUSCULAR | Status: AC
  Administered 2015-04-08: 40 mg via INTRAVENOUS
  Filled 2015-04-08: qty 4

## 2015-04-08 MED ORDER — LEVOFLOXACIN 750 MG PO TABS: 750.0000 mg | ORAL_TABLET | Freq: Every day | ORAL | Status: DC

## 2015-04-08 MED ORDER — HEPARIN SODIUM (PORCINE) 5000 UNIT/ML IJ SOLN
5000.0000 [IU] | Freq: Three times a day (TID) | INTRAMUSCULAR | Status: DC
  Administered 2015-04-08 – 2015-04-11 (×10): 5000 [IU] via SUBCUTANEOUS
  Filled 2015-04-08 (×10): qty 1

## 2015-04-08 MED ORDER — ONDANSETRON HCL 4 MG PO TABS: 4.0000 mg | ORAL_TABLET | Freq: Four times a day (QID) | ORAL | Status: DC | PRN

## 2015-04-08 MED ORDER — QUINAPRIL HCL 10 MG PO TABS
40.0000 mg | ORAL_TABLET | Freq: Every day | ORAL | Status: DC
  Administered 2015-04-08: 40 mg via ORAL
  Filled 2015-04-08 (×3): qty 4

## 2015-04-08 MED ORDER — LEVOTHYROXINE SODIUM 25 MCG PO TABS
25.0000 ug | ORAL_TABLET | Freq: Every day | ORAL | Status: DC
  Administered 2015-04-08 – 2015-04-11 (×4): 25 ug via ORAL
  Filled 2015-04-08 (×4): qty 1

## 2015-04-08 MED ORDER — ONDANSETRON HCL 4 MG/2ML IJ SOLN: 4.0000 mg | Freq: Four times a day (QID) | INTRAMUSCULAR | Status: DC | PRN

## 2015-04-08 MED FILL — Quinapril HCl Tab 20 MG: ORAL | Qty: 2 | Status: AC

## 2015-04-08 NOTE — Telephone Encounter (Signed)
[facility].  I have scheduled a a hospital follow appointment/MW

## 2015-04-08 NOTE — Telephone Encounter (Signed)
-----   Message from Coralee RudSabrina F Gilley sent at 04/08/2015  9:56 AM EST ----- Regarding: tcm/ph 04/19/2015 11:00 Eula Listenyan Dunn, PA

## 2015-04-08 NOTE — ED Notes (Signed)
Pt transported to CT via stretcher.  

## 2015-04-08 NOTE — ED Notes (Signed)
Dr. Brown at bedside

## 2015-04-08 NOTE — Progress Notes (Signed)
[facility] [place] [facility]   PATIENT NAME: Latoya Cordova    MR#:  [id number]  DATE OF BIRTH:  11-26-1945  SUBJECTIVE:  Patient here with atypical chest pain and neck pain. Patient also noted to have hypoxia.  REVIEW OF SYSTEMS:    Review of Systems  Constitutional: Negative for fever, chills and malaise/fatigue.  HENT: Negative for congestion, ear discharge, nosebleeds and sore throat.   Eyes: Negative for blurred vision.  Respiratory: Negative for cough, hemoptysis, shortness of breath, wheezing and stridor.   Cardiovascular: Negative for chest pain, palpitations and leg swelling.  Gastrointestinal: Negative for nausea, vomiting, abdominal pain, diarrhea and blood in stool.  Genitourinary: Negative for dysuria.  Musculoskeletal: Negative for back pain.  Neurological: Positive for headaches. Negative for dizziness and tremors.  Endo/Heme/Allergies: Does not bruise/bleed easily.    Tolerating Diet: Yes      DRUG ALLERGIES:   Allergies  Allergen Reactions  . Sulfa Antibiotics     Per Mother  . Lodine  [Etodolac] Rash    Abdominal rash.    VITALS:  Blood pressure 103/59, pulse 90, temperature 98.6 F (37 C), temperature source Oral, resp. rate 20, height 5\' 2"  (1.575 m), weight 92.488 kg (203 lb 14.4 oz), SpO2 96 %.  PHYSICAL EXAMINATION:   Physical Exam  Constitutional: She is oriented to person, place, and time and well-developed, well-nourished, and in no distress. No distress.  HENT:  Head: Normocephalic.  Eyes: No scleral icterus.  Neck: Normal range of motion. Neck supple. No JVD present. No tracheal deviation present.  Cardiovascular: Normal rate, regular rhythm and normal heart sounds.  Exam reveals no gallop and no friction rub.   No murmur heard. Pulmonary/Chest: Effort normal and breath sounds normal. No respiratory distress. She has no wheezes. She has no rales. She exhibits no tenderness.  Crackles heard left base   Abdominal: Soft. Bowel sounds are normal. She exhibits no distension and no mass. There is no tenderness. There is no rebound and no guarding.  Musculoskeletal: Normal range of motion. She exhibits no edema.  Neurological: She is alert and oriented to person, place, and time.  Skin: Skin is warm. No rash noted. No erythema.  Psychiatric: Affect and judgment normal.      LABORATORY PANEL:   CBC  Recent Labs Lab 04/07/15 2231  WBC 16.8*  HGB 14.8  HCT 44.9  PLT 256   ------------------------------------------------------------------------------------------------------------------  Chemistries   Recent Labs Lab 04/07/15 2231  NA 138  K 3.7  CL 101  CO2 27  GLUCOSE 139*  BUN 13  CREATININE 1.10*  CALCIUM 9.0   ------------------------------------------------------------------------------------------------------------------  Cardiac Enzymes  Recent Labs Lab 04/07/15 2231 04/08/15 0146  TROPONINI <0.03 <0.03   ------------------------------------------------------------------------------------------------------------------  RADIOLOGY:  Dg Chest 2 View  04/07/2015  CLINICAL DATA:  Chest pain, shortness of breath and palpitations. Onset this evening. EXAM: CHEST  2 VIEW COMPARISON:  06/17/2009 FINDINGS: Progressive cardiomegaly. Unchanged tortuosity of the thoracic aorta. Indistinctness of perihilar vasculature suggesting pulmonary edema. Bibasilar opacities may be atelectasis or vascular in etiology. No pleural effusion. No pneumothorax. No acute osseous abnormalities are seen. IMPRESSION: Increase cardiomegaly with probable pulmonary edema. Findings suggest mild CHF. Bibasilar opacities may be atelectasis or vascular in etiology. Electronically Signed   By: Rubye OaksMelanie  Ehinger M.D.   On: 04/07/2015 23:27   Ct Angio Chest Pe W/cm &/or Wo Cm  04/08/2015  CLINICAL DATA:  70 year old female with dyspnea and chest pain EXAM: CT ANGIOGRAPHY CHEST WITH  CONTRAST TECHNIQUE:  Multidetector CT imaging of the chest was performed using the standard protocol during bolus administration of intravenous contrast. Multiplanar CT image reconstructions and MIPs were obtained to evaluate the vascular anatomy. CONTRAST:  OMNIPAQUE IOHEXOL 350 MG/ML SOLN COMPARISON:  Radiograph dated 04/07/2015 FINDINGS: Evaluation of this exam is limited due to respiratory motion artifact. Evaluation is also limited due to streak artifact caused by patient's arms. There is mild emphysematous changes of the lungs. There is patchy areas of ground-glass opacities with mosaic appearance of the lungs compatible with small airways versus small vessel disease. Pneumonia is less likely but not excluded. Clinical correlation is recommended. There is no focal consolidation, pleural effusion, or pneumothorax. The central airways are patent. The thoracic aorta appears unremarkable. Evaluation of the pulmonary arteries is limited due to suboptimal visualization of the peripheral branches. No central pulmonary artery embolus identified. Top-normal cardiac size. No pericardial effusion. The esophagus is grossly unremarkable. No thyroid nodule. There is no axillary adenopathy. The chest wall soft tissues appear unremarkable. There is degenerative changes of the spine. No acute fracture. The visualized upper abdomen appears unremarkable. Review of the MIP images confirms the above findings. IMPRESSION: No definite CT evidence of central pulmonary embolus. Diffuse airspace haziness with areas of air trapping likely within small airways versus small vessel disease. Clinical correlation is recommended. Electronically Signed   By: Elgie Collard M.D.   On: 04/08/2015 01:36     ASSESSMENT AND PLAN:   70 year old female with history of hypertension who presented with chest pain upon of acute hypoxic respiratory failure.  1. Acute hypoxic respiratory failure: CT scan of the chest is negative for pulmonary emboli however  does show possible CHF versus pneumonia. BNP is normal. According to cardiology consultation BNP can be falsely low in obese patients. She had an echocardiogram in June which showed a normal ejection fraction without valvular abnormalities. We will reorder echocardiogram to see if patient does have acute congestive heart failure. She also likely has a underlying pneumonia. I have started Levaquin.   2. CAP: I have ordered Levaquin. I suspect her leukocytosis is due to pneumonia.  3. Essential hypertension: Continue quinapril.  4. Depression/anxiety: Continue Paxil  5. Hypothyroid: Continue Synthroid. TSH is within normal limits.   Management plans discussed with the patient and she is in agreement.  CODE STATUS: FULL TOTAL TIME TAKING CARE OF THIS PATIENT: 35 minutes.     POSSIBLE D/C tomorrow, DEPENDING ON CLINICAL CONDITION.   Donnah Levert M.D on 04/08/2015 at 11:35 AM  Between 7am to 6pm - Pager - [phone] After 6pm go to [email or web address] - password [email or web address] [facility]  [facility]  Office  [phone]  CC: Primary care physician; Megan Mans, MD  Note: This dictation was prepared with Dragon dictation along with smaller phrase technology. Any transcriptional errors that result from this process are unintentional.

## 2015-04-08 NOTE — Progress Notes (Signed)
Per Dr. Juliene PinaMody okay to hold Lasix 40mg  IV due to low BP. Per MD okay to administer Lasix 20mg  IV once. Patient is currently O2 sats at 92% on RA while laying in bed. Will continue to monitor patient. Rudean Haskellonnisha R Phillippe Orlick

## 2015-04-08 NOTE — Telephone Encounter (Signed)
Attempted to contact pt regarding discharge [facility] on 04/08/15. Left message asking pt to call back regarding discharge instructions and/or medications. Advised pt of appt w/ Eula Listenyan Dunn, PA on 04/19/15 at 11:00 w/ [facility]. Asked pt to call back if unable to keep this appt.

## 2015-04-08 NOTE — H&P (Signed)
[facility] [place] [facility]   PATIENT NAME: Latoya Cordova    MR#:  [id number]  DATE OF BIRTH:  08/09/1945   DATE OF ADMISSION:  04/07/2015  PRIMARY CARE PHYSICIAN: Megan Mansichard Gilbert Jr, MD   REQUESTING/REFERRING PHYSICIAN: Manson PasseyBrown  CHIEF COMPLAINT:   Chief Complaint  Patient presents with  . Chest Pain    HISTORY OF PRESENT ILLNESS:  Latoya Cordova  is a 70 y.o. female with a known history of essential hypertension presenting with chest pain. Acute onset chest pain occurred at rest retrosternal location radiation to shoulders bilaterally, pressure in quality 10/10 intensity no worsening or relieving factors associated shortness of breath.  PAST MEDICAL HISTORY:   Past Medical History  Diagnosis Date  . Hypertension     PAST SURGICAL HISTORY:   Past Surgical History  Procedure Laterality Date  . Abdominal hysterectomy  [id number]  . Appendectomy      SOCIAL HISTORY:   Social History  Substance Use Topics  . Smoking status: Never Smoker   . Smokeless tobacco: Not on file  . Alcohol Use: No    FAMILY HISTORY:   Family History  Problem Relation Age of Onset  . Ulcers Father   . Heart attack Paternal Grandfather   . Hypertension Mother   . Congestive Heart Failure Mother   . Breast cancer Maternal Aunt     DRUG ALLERGIES:   Allergies  Allergen Reactions  . Sulfa Antibiotics     Per Mother  . Lodine  [Etodolac] Rash    Abdominal rash.    REVIEW OF SYSTEMS:  REVIEW OF SYSTEMS:  CONSTITUTIONAL: Denies fevers, chills, fatigue, weakness.  EYES: Denies blurred vision, double vision, or eye pain.  EARS, NOSE, THROAT: Denies tinnitus, ear pain, hearing loss.  RESPIRATORY: denies cough, shortness of breath, wheezing  CARDIOVASCULAR: Positive chest pain, denies palpitations, edema.  GASTROINTESTINAL: Denies nausea, vomiting, diarrhea, abdominal pain.  GENITOURINARY: Denies dysuria, hematuria.  ENDOCRINE: Denies nocturia or  thyroid problems. HEMATOLOGIC AND LYMPHATIC: Denies easy bruising or bleeding.  SKIN: Denies rash or lesions.  MUSCULOSKELETAL: Denies pain in neck, back, shoulder, knees, hips, or further arthritic symptoms.  NEUROLOGIC: Denies paralysis, paresthesias.  PSYCHIATRIC: Denies anxiety or depressive symptoms. Otherwise full review of systems performed by me is negative.   MEDICATIONS AT HOME:   Prior to Admission medications   Medication Sig Start Date End Date Taking? Authorizing Provider  butalbital-aspirin-caffeine [facility](FIORINAL) 50-325-40 MG capsule Take 1 capsule by mouth 2 (two) times daily as needed for headache. 01/13/15  Yes Richard Hulen ShoutsL Gilbert Jr., MD  esomeprazole (NEXIUM) 40 MG capsule Take 1 capsule (40 mg total) by mouth daily. 01/13/15  Yes Richard Hulen ShoutsL Gilbert Jr., MD  hydrochlorothiazide (HYDRODIURIL) 25 MG tablet Take 1 tablet (25 mg total) by mouth daily. 01/13/15  Yes Richard Hulen ShoutsL Gilbert Jr., MD  levothyroxine (SYNTHROID, LEVOTHROID) 25 MCG tablet Take 1 tablet (25 mcg total) by mouth daily. 01/13/15  Yes Richard Hulen ShoutsL Gilbert Jr., MD  LORazepam (ATIVAN) 1 MG tablet Take 1 tablet (1 mg total) by mouth at bedtime. 01/13/15  Yes Richard Hulen ShoutsL Gilbert Jr., MD  PARoxetine (PAXIL) 40 MG tablet Take 1 tablet (40 mg total) by mouth daily. 01/13/15  Yes Richard Hulen ShoutsL Gilbert Jr., MD  quinapril (ACCUPRIL) 40 MG tablet Take 1 tablet (40 mg total) by mouth daily. 01/13/15  Yes Richard Hulen ShoutsL Gilbert Jr., MD  simethicone (MYLICON) 80 MG chewable tablet Chew 80 mg by mouth daily as needed for flatulence.  Yes Historical Provider, MD      VITAL SIGNS:  Blood pressure 126/69, pulse 123, temperature 98.6 F (37 C), temperature source Oral, resp. rate 29, height 5\' 2"  (1.575 m), weight 185 lb (83.915 kg), SpO2 95 %.  PHYSICAL EXAMINATION:  VITAL SIGNS: Filed Vitals:   04/08/15 0210 04/08/15 0216  BP: 152/101 126/69  Pulse: 131 123  Temp:    Resp: 41 29   GENERAL:69 y.o.female currently in no acute distress.   HEAD: Normocephalic, atraumatic.  EYES: Pupils equal, round, reactive to light. Extraocular muscles intact. No scleral icterus.  MOUTH: Moist mucosal membrane. Dentition intact. No abscess noted.  EAR, NOSE, THROAT: Clear without exudates. No external lesions.  NECK: Supple. No thyromegaly. No nodules. No JVD.  PULMONARY: Clear to ascultation, without wheeze rails or rhonci. No use of accessory muscles, Good respiratory effort. good air entry bilaterally CHEST: Nontender to palpation.  CARDIOVASCULAR: S1 and S2. Tachycardic. No murmurs, rubs, or gallops. No edema. Pedal pulses 2+ bilaterally.  GASTROINTESTINAL: Soft, nontender, nondistended. No masses. Positive bowel sounds. No hepatosplenomegaly.  MUSCULOSKELETAL: No swelling, clubbing, or edema. Range of motion full in all extremities.  NEUROLOGIC: Cranial nerves II through XII are intact. No gross focal neurological deficits. Sensation intact. Reflexes intact.  SKIN: No ulceration, lesions, rashes, or cyanosis. Skin warm and dry. Turgor intact.  PSYCHIATRIC: Mood, affect within normal limits. The patient is awake, alert and oriented x 3. Insight, judgment intact.    LABORATORY PANEL:   CBC  Recent Labs Lab 04/07/15 2231  WBC 16.8*  HGB 14.8  HCT 44.9  PLT 256   ------------------------------------------------------------------------------------------------------------------  Chemistries   Recent Labs Lab 04/07/15 2231  NA 138  K 3.7  CL 101  CO2 27  GLUCOSE 139*  BUN 13  CREATININE 1.10*  CALCIUM 9.0   ------------------------------------------------------------------------------------------------------------------  Cardiac Enzymes  Recent Labs Lab 04/08/15 0146  TROPONINI <0.03   ------------------------------------------------------------------------------------------------------------------  RADIOLOGY:  Dg Chest 2 View  04/07/2015  CLINICAL DATA:  Chest pain, shortness of breath and palpitations. Onset  this evening. EXAM: CHEST  2 VIEW COMPARISON:  06/17/2009 FINDINGS: Progressive cardiomegaly. Unchanged tortuosity of the thoracic aorta. Indistinctness of perihilar vasculature suggesting pulmonary edema. Bibasilar opacities may be atelectasis or vascular in etiology. No pleural effusion. No pneumothorax. No acute osseous abnormalities are seen. IMPRESSION: Increase cardiomegaly with probable pulmonary edema. Findings suggest mild CHF. Bibasilar opacities may be atelectasis or vascular in etiology. Electronically Signed   By: Rubye Oaks M.D.   On: 04/07/2015 23:27   Ct Angio Chest Pe W/cm &/or Wo Cm  04/08/2015  CLINICAL DATA:  70 year old female with dyspnea and chest pain EXAM: CT ANGIOGRAPHY CHEST WITH CONTRAST TECHNIQUE: Multidetector CT imaging of the chest was performed using the standard protocol during bolus administration of intravenous contrast. Multiplanar CT image reconstructions and MIPs were obtained to evaluate the vascular anatomy. CONTRAST:  OMNIPAQUE IOHEXOL 350 MG/ML SOLN COMPARISON:  Radiograph dated 04/07/2015 FINDINGS: Evaluation of this exam is limited due to respiratory motion artifact. Evaluation is also limited due to streak artifact caused by patient's arms. There is mild emphysematous changes of the lungs. There is patchy areas of ground-glass opacities with mosaic appearance of the lungs compatible with small airways versus small vessel disease. Pneumonia is less likely but not excluded. Clinical correlation is recommended. There is no focal consolidation, pleural effusion, or pneumothorax. The central airways are patent. The thoracic aorta appears unremarkable. Evaluation of the pulmonary arteries is limited due to suboptimal visualization of the  peripheral branches. No central pulmonary artery embolus identified. Top-normal cardiac size. No pericardial effusion. The esophagus is grossly unremarkable. No thyroid nodule. There is no axillary adenopathy. The chest wall  soft tissues appear unremarkable. There is degenerative changes of the spine. No acute fracture. The visualized upper abdomen appears unremarkable. Review of the MIP images confirms the above findings. IMPRESSION: No definite CT evidence of central pulmonary embolus. Diffuse airspace haziness with areas of air trapping likely within small airways versus small vessel disease. Clinical correlation is recommended. Electronically Signed   By: Elgie Collard M.D.   On: 04/08/2015 01:36    EKG:   Orders placed or performed during the hospital encounter of 04/07/15  . ED EKG within 10 minutes  . ED EKG within 10 minutes    IMPRESSION AND PLAN:   70 year old Caucasian female history of essential hypertension as well as gastroesophageal reflux disease without esophagitis presenting with chest pain  1.Chest pain, central: Initiate aspirin and statin therapy, admitted to telemetry, trend cardiac enzymes 3,  if continued elevation will initiate heparin drip ,nitroglycerin when necessary, morphine when necessary,  2. Hypothyroidism unspecified Synthroid 3. GERD and esophagitis: PPI therapy 4. Essential hypertension: Quinapril 5. Venous thrombus embolism prophylactic: Heparin subcutaneous      All the records are reviewed and case discussed with ED provider. Management plans discussed with the patient, family and they are in agreement.  CODE STATUS: Full  TOTAL TIME TAKING CARE OF THIS PATIENT: 35 minutes.    Jourdyn Hasler,  Mardi Mainland.D on 04/08/2015 at 2:39 AM  Between 7am to 6pm - Pager - [phone]  After 6pm: House Pager: - [phone]  [facility]  Office  [phone]  CC: Primary care physician; Megan Mans, MD

## 2015-04-08 NOTE — ED Notes (Signed)
Pt dipped down to 85% 02 on [place] 2L. Pt stayed there on 3L. Pt came up to 89% on 4L [place]. Pt is now on 5L [place] at 97%.

## 2015-04-08 NOTE — Progress Notes (Signed)
Patient was admitted from the ER d/t c/o acute chest pain. On admission patient denied having chest pain, but endorsed some mild pain at the back of her neck. Patient and family were oriented to the unit and admission documentation was completed. Patient remained ST on the monitor with VS WDL for patient. PRN Tylenol was administered for acute headache.

## 2015-04-08 NOTE — Progress Notes (Signed)
Gollan paged. Via telephone, verified that MD wanted to administer IV Lasix as ordered with patient's BP 101/63. MD stated to continue with medication administration

## 2015-04-08 NOTE — ED Notes (Addendum)
Pt states she is having intermittent sharp pains which correlate to breathing. Pt is still tachypneic after breathing treatment. Pt states she feels like she is breathing slightly better after breathing treatment.

## 2015-04-08 NOTE — Care Management Obs Status (Signed)
MEDICARE OBSERVATION STATUS NOTIFICATION   Patient Details  Name: Latoya Cordova MRN: [id number] Date of Birth: 02/18/1946   Medicare Observation Status Notification Given:    No- changed to inpatient within less than 24 hours of stay   Eber HongGreene, Reine Bristow R, RN 04/08/2015, 12:53 PM

## 2015-04-08 NOTE — Progress Notes (Signed)
Patient stated she does not feel completed with being discharged home today. Patient stated, "i just don't feel right". Will notified MD of patient's concerns. Patient ambulated in the hall up to the nursing station. O2 sats dropped to 86% while ambulating, patient complained of SOB, shoulder pain and some mild chest pain. Patient symptoms were relieved with rest. Placed patient on 2L nasal cannula, patient O2 sats 96% at rest. Will continue to monitor patient. Rudean Haskellonnisha R Arham Symmonds

## 2015-04-08 NOTE — Telephone Encounter (Signed)
[id number] 

## 2015-04-08 NOTE — ED Notes (Signed)
CT called to inform them that pt's breathing treatment was finished.

## 2015-04-08 NOTE — Consult Note (Signed)
Cardiology Consultation Note  Patient ID: Latoya BenderSylvia K Cordova, MRN: [id number], DOB/AGE: July 24, 1945 70 y.o. Admit date: 04/07/2015   Date of Consult: 04/08/2015 Primary Physician: Megan Mansichard Gilbert Jr, MD Primary Cardiologist: Previously [facility] in [facility]  Chief Complaint: Bilateral posterior shoulder pain/chest pain/SOB Reason for Consult: Chest pain and SOB  HPI: 70 y.o. female with h/o HTN and obesity who [facility] with increased SOB and bilateral posterior should pain leading to chest pain occuring at rest.   She was previously seen by Dr. Myrtis SerKatz in 2004 for chest pain. She apparently underwent an echo at that time that she reports being normal, though results are not on file. She most recently underwent echo in June 2016 by per PCP 2/2 murmur that showed EF 50-55%, mild MR. She reports decreased exercise tolerance over the years being able to ambulate 1/4 of a mile back in the summer of 2016. This has decreased considerably most recently. Her weight she reports has remained stable. She denies any LEE. She does sleep in a recliner and has done so for at least 10 years 2/2 orthopnea. She also notes early satiety as of late.   While sitting at home on 1/11 she began to note bilateral posterior shoulder pain. This led to SOB and chest pain. She does report increased anxiety with the above symptoms. The above symptoms were new for her. Pain lasted from the afternoon until her [facility]. No exertional symptoms.   [facility] the [facility]'s [facility] they were found to have a troponin negative x 2, BNP of 63, WBC 16.3, SCr 1.10, . ECG as below, CXR showed possible mild CHF. CTA PE protocol was negative or obvious PE, though there was motion artifact. Could not rule out infection. She desaturated to the mid 80's on room air with ambulation this morning requiring O2 via nasal cannula. She continues to note SOB.     Past Medical History  Diagnosis Date  . Hypertension       Most Recent  Cardiac Studies: Echo 6/62016: Study Conclusions - Left ventricle: The cavity size was normal. Systolic function was normal. The estimated ejection fraction was in the range of 50% to 55%. - Mitral valve: There was mild regurgitation.   Surgical History:  Past Surgical History  Procedure Laterality Date  . Abdominal hysterectomy  [id number]  . Appendectomy       Home Meds: Prior to Admission medications   Medication Sig Start Date End Date Taking? Authorizing Provider  butalbital-aspirin-caffeine [facility](FIORINAL) 50-325-40 MG capsule Take 1 capsule by mouth 2 (two) times daily as needed for headache. 01/13/15  Yes Richard Hulen ShoutsL Gilbert Jr., MD  esomeprazole (NEXIUM) 40 MG capsule Take 1 capsule (40 mg total) by mouth daily. 01/13/15  Yes Richard Hulen ShoutsL Gilbert Jr., MD  hydrochlorothiazide (HYDRODIURIL) 25 MG tablet Take 1 tablet (25 mg total) by mouth daily. 01/13/15  Yes Richard Hulen ShoutsL Gilbert Jr., MD  levothyroxine (SYNTHROID, LEVOTHROID) 25 MCG tablet Take 1 tablet (25 mcg total) by mouth daily. 01/13/15  Yes Richard Hulen ShoutsL Gilbert Jr., MD  LORazepam (ATIVAN) 1 MG tablet Take 1 tablet (1 mg total) by mouth at bedtime. 01/13/15  Yes Richard Hulen ShoutsL Gilbert Jr., MD  PARoxetine (PAXIL) 40 MG tablet Take 1 tablet (40 mg total) by mouth daily. 01/13/15  Yes Richard Hulen ShoutsL Gilbert Jr., MD  quinapril (ACCUPRIL) 40 MG tablet Take 1 tablet (40 mg total) by mouth daily. 01/13/15  Yes Richard Hulen ShoutsL Gilbert Jr., MD  simethicone [facility](MYLICON) 80  MG chewable tablet Chew 80 mg by mouth daily as needed for flatulence.   Yes Historical Provider, MD  levofloxacin (LEVAQUIN) 750 MG tablet Take 1 tablet (750 mg total) by mouth daily. 04/08/15   Adrian Saran, MD    Inpatient Medications:  . atorvastatin  40 mg Oral q1800  . heparin  5,000 Units Subcutaneous 3 times per day  . levofloxacin  750 mg Oral Daily  . levothyroxine  25 mcg Oral QAC breakfast  . LORazepam  1 mg Oral QHS  . pantoprazole  40 mg Oral Daily  . PARoxetine  40 mg Oral Daily   . quinapril  40 mg Oral Daily  . sodium chloride  3 mL Intravenous Q12H      Allergies:  Allergies  Allergen Reactions  . Sulfa Antibiotics     Per Mother  . Lodine  [Etodolac] Rash    Abdominal rash.    Social History   Social History  . Marital Status: Widowed    Spouse Name: N/A  . Number of Children: N/A  . Years of Education: N/A   Occupational History  . Not on file.   Social History Main Topics  . Smoking status: Never Smoker   . Smokeless tobacco: Not on file  . Alcohol Use: No  . Drug Use: No  . Sexual Activity: Not on file   Other Topics Concern  . Not on file   Social History Narrative     Family History  Problem Relation Age of Onset  . Ulcers Father   . Heart attack Paternal Grandfather   . Hypertension Mother   . Congestive Heart Failure Mother   . Breast cancer Maternal Aunt      Review of Systems: Review of Systems  Constitutional: Positive for malaise/fatigue. Negative for fever, chills, weight loss and diaphoresis.  HENT: Negative for congestion.   Eyes: Negative for discharge and redness.  Respiratory: Positive for cough, shortness of breath and wheezing. Negative for hemoptysis and sputum production.   Cardiovascular: Positive for chest pain and orthopnea. Negative for palpitations, claudication, leg swelling and PND.  Gastrointestinal: Negative for heartburn, nausea, vomiting and abdominal pain.  Musculoskeletal: Positive for myalgias and back pain. Negative for falls.  Skin: Negative for rash.  Neurological: Positive for weakness. Negative for dizziness, tingling, tremors, sensory change, speech change, focal weakness, seizures and loss of consciousness.  Endo/Heme/Allergies: Does not bruise/bleed easily.  Psychiatric/Behavioral: Negative for substance abuse. The patient is not nervous/anxious.   All other systems reviewed and are negative.   Labs:  Recent Labs  04/07/15 2231 04/08/15 0146  TROPONINI <0.03 <0.03   Lab  Results  Component Value Date   WBC 16.8* 04/07/2015   HGB 14.8 04/07/2015   HCT 44.9 04/07/2015   MCV 90.3 04/07/2015   PLT 256 04/07/2015    Recent Labs Lab 04/07/15 2231  NA 138  K 3.7  CL 101  CO2 27  BUN 13  CREATININE 1.10*  CALCIUM 9.0  GLUCOSE 139*   Lab Results  Component Value Date   CHOL 196 01/19/2015   HDL 44 01/19/2015   LDLCALC 106* 01/19/2015   TRIG 229* 01/19/2015   No results found for: DDIMER  Radiology/Studies:  Dg Chest 2 View  04/07/2015  CLINICAL DATA:  Chest pain, shortness of breath and palpitations. Onset this evening. EXAM: CHEST  2 VIEW COMPARISON:  06/17/2009 FINDINGS: Progressive cardiomegaly. Unchanged tortuosity of the thoracic aorta. Indistinctness of perihilar vasculature suggesting pulmonary edema. Bibasilar opacities may be  atelectasis or vascular in etiology. No pleural effusion. No pneumothorax. No acute osseous abnormalities are seen. IMPRESSION: Increase cardiomegaly with probable pulmonary edema. Findings suggest mild CHF. Bibasilar opacities may be atelectasis or vascular in etiology. Electronically Signed   By: Rubye Oaks M.D.   On: 04/07/2015 23:27   Ct Angio Chest Pe W/cm &/or Wo Cm  04/08/2015  CLINICAL DATA:  69 year old female with dyspnea and chest pain EXAM: CT ANGIOGRAPHY CHEST WITH CONTRAST TECHNIQUE: Multidetector CT imaging of the chest was performed using the standard protocol during bolus administration of intravenous contrast. Multiplanar CT image reconstructions and MIPs were obtained to evaluate the vascular anatomy. CONTRAST:  OMNIPAQUE IOHEXOL 350 MG/ML SOLN COMPARISON:  Radiograph dated 04/07/2015 FINDINGS: Evaluation of this exam is limited due to respiratory motion artifact. Evaluation is also limited due to streak artifact caused by patient's arms. There is mild emphysematous changes of the lungs. There is patchy areas of ground-glass opacities with mosaic appearance of the lungs compatible with small  airways versus small vessel disease. Pneumonia is less likely but not excluded. Clinical correlation is recommended. There is no focal consolidation, pleural effusion, or pneumothorax. The central airways are patent. The thoracic aorta appears unremarkable. Evaluation of the pulmonary arteries is limited due to suboptimal visualization of the peripheral branches. No central pulmonary artery embolus identified. Top-normal cardiac size. No pericardial effusion. The esophagus is grossly unremarkable. No thyroid nodule. There is no axillary adenopathy. The chest wall soft tissues appear unremarkable. There is degenerative changes of the spine. No acute fracture. The visualized upper abdomen appears unremarkable. Review of the MIP images confirms the above findings. IMPRESSION: No definite CT evidence of central pulmonary embolus. Diffuse airspace haziness with areas of air trapping likely within small airways versus small vessel disease. Clinical correlation is recommended. Electronically Signed   By: Elgie Collard M.D.   On: 04/08/2015 01:36    EKG: sinus tachycardia, 121 bpm, left axis deviation, poor R wave progression  Weights: [facility] Weights   04/07/15 2226 04/08/15 0436  Weight: 185 lb (83.915 kg) 203 lb 14.4 oz (92.488 kg)     Physical Exam: Blood pressure 103/59, pulse 90, temperature 98.6 F (37 C), temperature source Oral, resp. rate 20, height 5\' 2"  (1.575 m), weight 203 lb 14.4 oz (92.488 kg), SpO2 93 %. Body mass index is 37.28 kg/(m^2). General: Well developed, well nourished, in no acute distress. Head: Normocephalic, atraumatic, sclera non-icteric, no xanthomas, nares are without discharge.  Neck: Negative for carotid bruits. JVD not elevated. Lungs: Bilateral crackles, L>R. Breathing is unlabored. Heart: RRR with S1 S2. No murmurs, rubs, or gallops appreciated. Abdomen: Soft, non-tender, non-distended with normoactive bowel sounds. No hepatomegaly. No rebound/guarding. No obvious  abdominal masses. Msk:  Strength and tone appear normal for age. Extremities: No clubbing or cyanosis. No edema.  Distal pedal pulses are 2+ and equal bilaterally. Neuro: Alert and oriented X 3. No facial asymmetry. No focal deficit. Moves all extremities spontaneously. Psych:  Responds to questions appropriately with a normal affect.    Assessment and Plan:   1. Acute respiratory distress with hypoxia: -Symptoms with unclear etiology at this time -Possible pulmonary infection vs acute CHF  -Wean oxygen as able -CT scan with no definite evidence of PE, if high suspicion may need to treat empirically as she remains hypoxic and tachycardic   2. Possible Acute CHF: -Check echo to evaluate LV function, wall motion, and RV size  -BNP can be falsely low in obese females -Hold IV  Lasix given bump in renal function, restart diuresis when renal function trends down -Hold beta blocker currently given #1  3. Leukocytosis: -On Levaquin per IM -Trend   Signed, Eula Listen, PA-C Pager: [phone] 04/08/2015, 10:49 AM

## 2015-04-08 NOTE — Progress Notes (Signed)
*  PRELIMINARY RESULTS* Echocardiogram 2D Echocardiogram has been performed.  Latoya Ridgelikeshia S Cordova 04/08/2015, 3:58 PM

## 2015-04-08 NOTE — ED Notes (Signed)
Pt ambulatory to toilet with 1 nurse assist.  

## 2015-04-08 NOTE — ED Notes (Signed)
Pt is restless, states pain is in back. Pt sitting on edge of bed. Pt has taken 02 off, on RA pt 02 sat is 94%. Will continue to monitor pt.

## 2015-04-08 NOTE — ED Notes (Signed)
Pt's 02 sat was hanging around 88% on RA. I put pt back on 3L [place]. Pt verbalized understanding of wearing [place].

## 2015-04-09 ENCOUNTER — Inpatient Hospital Stay: Payer: PPO

## 2015-04-09 LAB — BASIC METABOLIC PANEL
ANION GAP: 9 (ref 5–15)
BUN: 17 mg/dL (ref 6–20)
CO2: 28 mmol/L (ref 22–32)
Calcium: 8.9 mg/dL (ref 8.9–10.3)
Chloride: 95 mmol/L — ABNORMAL LOW (ref 101–111)
Creatinine, Ser: 0.97 mg/dL (ref 0.44–1.00)
GFR calc non Af Amer: 58 mL/min — ABNORMAL LOW (ref >60)
GLUCOSE: 126 mg/dL — AB (ref 65–99)
POTASSIUM: 4.5 mmol/L (ref 3.5–5.1)
Sodium: 132 mmol/L — ABNORMAL LOW (ref 135–145)

## 2015-04-09 MED ORDER — INFLUENZA VAC SPLIT QUAD 0.5 ML IM SUSY: 0.5000 mL | PREFILLED_SYRINGE | INTRAMUSCULAR | Status: DC

## 2015-04-09 MED ORDER — FUROSEMIDE 10 MG/ML IJ SOLN: 40.0000 mg | Freq: Four times a day (QID) | INTRAMUSCULAR | Status: DC

## 2015-04-09 MED ORDER — LISINOPRIL 20 MG PO TABS
40.0000 mg | ORAL_TABLET | Freq: Every day | ORAL | Status: DC
  Administered 2015-04-09 – 2015-04-11 (×3): 40 mg via ORAL
  Filled 2015-04-09 (×3): qty 2

## 2015-04-09 NOTE — Progress Notes (Signed)
MD Gollan made aware that pt's urine is dark and concentrated.  [facility]  MD Mody thinks pt has viral PNA?

## 2015-04-09 NOTE — Progress Notes (Signed)
Initial appointment was made at the [facility] on April 30, 2015 at 11:00am.

## 2015-04-09 NOTE — Progress Notes (Signed)
[facility] [place] [facility]   PATIENT NAME: Latoya Cordova    MR#:  [id number]  DATE OF BIRTH:  09-08-1945  SUBJECTIVE:  Patient still has a headache which is her usual headache. She denies neck stiffness or photophobia. Her O2 saturation is 91% while sitting. It decreases when she and her lites. She still has a cough. No fever.Marland Kitchen  REVIEW OF SYSTEMS:    Review of Systems  Constitutional: Negative for fever, chills and malaise/fatigue.  HENT: Negative for congestion, ear discharge, nosebleeds and sore throat.   Eyes: Negative for blurred vision.  Respiratory: Positive for cough and shortness of breath. Negative for hemoptysis, wheezing and stridor.   Cardiovascular: Negative for chest pain, palpitations and leg swelling.  Gastrointestinal: Negative for nausea, vomiting, abdominal pain, diarrhea and blood in stool.  Genitourinary: Negative for dysuria.  Musculoskeletal: Negative for back pain.  Neurological: Positive for headaches. Negative for dizziness and tremors.  Endo/Heme/Allergies: Does not bruise/bleed easily.    Tolerating Diet: Yes      DRUG ALLERGIES:   Allergies  Allergen Reactions  . Sulfa Antibiotics     Per Mother  . Lodine  [Etodolac] Rash    Abdominal rash.    VITALS:  Blood pressure 106/60, pulse 93, temperature 98 F (36.7 C), temperature source Oral, resp. rate 16, height 5\' 2"  (1.575 m), weight 92.488 kg (203 lb 14.4 oz), SpO2 91 %.  PHYSICAL EXAMINATION:   Physical Exam  Constitutional: She is oriented to person, place, and time and well-developed, well-nourished, and in no distress. No distress.  HENT:  Head: Normocephalic.  Eyes: No scleral icterus.  Neck: Normal range of motion. Neck supple. No JVD present. No tracheal deviation present.  Cardiovascular: Normal rate, regular rhythm and normal heart sounds.  Exam reveals no gallop and no friction rub.   No murmur heard. Pulmonary/Chest: Effort normal and  breath sounds normal. No respiratory distress. She has no wheezes. She has no rales. She exhibits no tenderness.  Crackles at bases.  Abdominal: Soft. Bowel sounds are normal. She exhibits no distension and no mass. There is no tenderness. There is no rebound and no guarding.  Musculoskeletal: Normal range of motion. She exhibits no edema.  Neurological: She is alert and oriented to person, place, and time.  Skin: Skin is warm. No rash noted. No erythema.  Psychiatric: Affect and judgment normal.      LABORATORY PANEL:   CBC  Recent Labs Lab 04/07/15 2231  WBC 16.8*  HGB 14.8  HCT 44.9  PLT 256   ------------------------------------------------------------------------------------------------------------------  Chemistries   Recent Labs Lab 04/09/15 0451  NA 132*  K 4.5  CL 95*  CO2 28  GLUCOSE 126*  BUN 17  CREATININE 0.97  CALCIUM 8.9   ------------------------------------------------------------------------------------------------------------------  Cardiac Enzymes  Recent Labs Lab 04/07/15 2231 04/08/15 0146  TROPONINI <0.03 <0.03   ------------------------------------------------------------------------------------------------------------------  RADIOLOGY:  Dg Chest 1 View  04/09/2015  CLINICAL DATA:  Hypoxia EXAM: CHEST 1 VIEW COMPARISON:  04/07/2015 FINDINGS: Cardiomegaly, vascular pedicle widening, and aortic tortuosity as seen on recent chest CT. There are low lung volumes with interstitial opacities at the bases. Blunting of the lateral left costophrenic sulcus is likely fat pad based on CT. No definitive effusion. No pneumothorax. IMPRESSION: 1. Cardiomegaly and pulmonary venous congestion. 2. Low volumes with basilar opacity representing atelectasis on prior CT Electronically Signed   By: Marnee Spring M.D.   On: 04/09/2015 08:19   Dg Chest 2 View  04/07/2015  CLINICAL DATA:  Chest pain, shortness of breath and palpitations. Onset this evening.  EXAM: CHEST  2 VIEW COMPARISON:  06/17/2009 FINDINGS: Progressive cardiomegaly. Unchanged tortuosity of the thoracic aorta. Indistinctness of perihilar vasculature suggesting pulmonary edema. Bibasilar opacities may be atelectasis or vascular in etiology. No pleural effusion. No pneumothorax. No acute osseous abnormalities are seen. IMPRESSION: Increase cardiomegaly with probable pulmonary edema. Findings suggest mild CHF. Bibasilar opacities may be atelectasis or vascular in etiology. Electronically Signed   By: Rubye Oaks M.D.   On: 04/07/2015 23:27   Ct Angio Chest Pe W/cm &/or Wo Cm  04/08/2015  CLINICAL DATA:  70 year old female with dyspnea and chest pain EXAM: CT ANGIOGRAPHY CHEST WITH CONTRAST TECHNIQUE: Multidetector CT imaging of the chest was performed using the standard protocol during bolus administration of intravenous contrast. Multiplanar CT image reconstructions and MIPs were obtained to evaluate the vascular anatomy. CONTRAST:  OMNIPAQUE IOHEXOL 350 MG/ML SOLN COMPARISON:  Radiograph dated 04/07/2015 FINDINGS: Evaluation of this exam is limited due to respiratory motion artifact. Evaluation is also limited due to streak artifact caused by patient's arms. There is mild emphysematous changes of the lungs. There is patchy areas of ground-glass opacities with mosaic appearance of the lungs compatible with small airways versus small vessel disease. Pneumonia is less likely but not excluded. Clinical correlation is recommended. There is no focal consolidation, pleural effusion, or pneumothorax. The central airways are patent. The thoracic aorta appears unremarkable. Evaluation of the pulmonary arteries is limited due to suboptimal visualization of the peripheral branches. No central pulmonary artery embolus identified. Top-normal cardiac size. No pericardial effusion. The esophagus is grossly unremarkable. No thyroid nodule. There is no axillary adenopathy. The chest wall soft tissues  appear unremarkable. There is degenerative changes of the spine. No acute fracture. The visualized upper abdomen appears unremarkable. Review of the MIP images confirms the above findings. IMPRESSION: No definite CT evidence of central pulmonary embolus. Diffuse airspace haziness with areas of air trapping likely within small airways versus small vessel disease. Clinical correlation is recommended. Electronically Signed   By: Elgie Collard M.D.   On: 04/08/2015 01:36     ASSESSMENT AND PLAN:   70 year old female with history of hypertension who presented with chest pain upon of acute hypoxic respiratory failure.  1. Acute hypoxic respiratory failure: CT scan of the chest is negative for pulmonary emboli however does show possible CHF versus pneumonia. BNP is normal. Repeat echocardiogram during this admission shows a normal ejection fraction and no valvular abnormalities. There is no evidence of congestive heart failure.  Her chest x-ray today shows pulmonary edema. I am suspecting patient may have a viral pneumonitis or pneumonia. I will consult pulmonary for further evaluation. She may need a another CT scan of her chest this time for an evaluation of pneumonitis. Further recommendations as per pulmonary.  2. CAP: Continue Levaquin. I suspect her leukocytosis is due to pneumonia.  3. Essential hypertension: Continue quinapril.  4. Depression/anxiety: Continue Paxil  5. Hypothyroid: Continue Synthroid. TSH is within normal limits. 6. Hyponatremia: I suspect this is related to diuresis.  7. Headache: This is typical headache for the patient. Continue pain medications.  Management plans discussed with the patient and she is in agreement. Plan of care discussed with Alycia Rossetti, PA cardiology CODE STATUS: FULL TOTAL TIME TAKING CARE OF THIS PATIENT: 28 minutes.     POSSIBLE D/C 1-2 days, DEPENDING ON CLINICAL CONDITION.   Marjarie Irion M.D on 04/09/2015 at 10:46 AM  Between 7am  to 6pm -  Pager - [phone] After 6pm go to [email or web address] - password [email or web address] [facility]  [facility]  Office  [phone]  CC: Primary care physician; Megan Mansichard Gilbert Jr, MD  Note: This dictation was prepared with Dragon dictation along with smaller phrase technology. Any transcriptional errors that result from this process are unintentional.

## 2015-04-09 NOTE — Progress Notes (Signed)
SATURATION QUALIFICATIONS: (This note is used to comply with regulatory documentation for home oxygen)  Patient Saturations on Room Air at Rest = 91%  Patient Saturations on Room Air while Ambulating = 90%  Patient Saturations on 0 Liters of oxygen while Ambulating = 90%  Please briefly explain why patient needs home oxygen: 

## 2015-04-09 NOTE — Progress Notes (Signed)
Patient: Latoya Cordova / Admit Date: 04/07/2015 / Date of Encounter: 04/09/2015, 10:02 AM   Subjective: Headache overnight, given pain meds Lasix 40 mg IV x 1 last night, improvement of SOB, Still not at baseline Still with rales  Review of Systems: Review of Systems  Constitutional: Negative.   Respiratory: Positive for shortness of breath.   Cardiovascular: Positive for orthopnea.  Gastrointestinal: Negative.   Musculoskeletal: Negative.   Neurological: Negative.   Psychiatric/Behavioral: Negative.   All other systems reviewed and are negative.   Objective: Telemetry: NSR Physical Exam: Blood pressure 106/60, pulse 93, temperature 98 F (36.7 C), temperature source Oral, resp. rate 16, height 5\' 2"  (1.575 m), weight 203 lb 14.4 oz (92.488 kg), SpO2 91 %. Body mass index is 37.28 kg/(m^2). General: Well developed, well nourished, in no acute distress, on oxygen Head: Normocephalic, atraumatic, sclera non-icteric, no xanthomas, nares are without discharge. Neck: Negative for carotid bruits. JVP not elevated. Lungs: rales at the bases b/l Heart: RRR S1 S2 without murmurs, rubs, or gallops.  Abdomen: Soft, non-tender, non-distended with normoactive bowel sounds. No rebound/guarding. Extremities: No clubbing or cyanosis. No edema. Distal pedal pulses are 2+ and equal bilaterally. Neuro: Alert and oriented X 3. Moves all extremities spontaneously. Psych:  Responds to questions appropriately with a normal affect.   Intake/Output Summary (Last 24 hours) at 04/09/15 1002 Last data filed at 04/09/15 1001  Gross per 24 hour  Intake    480 ml  Output   2250 ml  Net  -1770 ml    Inpatient Medications:  . atorvastatin  40 mg Oral q1800  . furosemide  40 mg Intravenous Q6H  . heparin  5,000 Units Subcutaneous 3 times per day  . [START ON 04/10/2015] Influenza vac split quadrivalent PF  0.5 mL Intramuscular Tomorrow-1000  . levofloxacin  750 mg Oral Daily  . levothyroxine  25  mcg Oral QAC breakfast  . LORazepam  1 mg Oral QHS  . pantoprazole  40 mg Oral Daily  . PARoxetine  40 mg Oral Daily  . quinapril  40 mg Oral Daily   Infusions:    Labs:  Recent Labs  04/07/15 2231 04/09/15 0451  NA 138 132*  K 3.7 4.5  CL 101 95*  CO2 27 28  GLUCOSE 139* 126*  BUN 13 17  CREATININE 1.10* 0.97  CALCIUM 9.0 8.9   No results for input(s): AST, ALT, ALKPHOS, BILITOT, PROT, ALBUMIN in the last 72 hours.  Recent Labs  04/07/15 2231  WBC 16.8*  HGB 14.8  HCT 44.9  MCV 90.3  PLT 256    Recent Labs  04/07/15 2231 04/08/15 0146  TROPONINI <0.03 <0.03   Invalid input(s): POCBNP No results for input(s): HGBA1C in the last 72 hours.   Weights: Filed Weights   04/07/15 2226 04/08/15 0436  Weight: 185 lb (83.915 kg) 203 lb 14.4 oz (92.488 kg)     Radiology/Studies:  Dg Chest 1 View  04/09/2015  CLINICAL DATA:  Hypoxia EXAM: CHEST 1 VIEW COMPARISON:  04/07/2015 FINDINGS: Cardiomegaly, vascular pedicle widening, and aortic tortuosity as seen on recent chest CT. There are low lung volumes with interstitial opacities at the bases. Blunting of the lateral left costophrenic sulcus is likely fat pad based on CT. No definitive effusion. No pneumothorax. IMPRESSION: 1. Cardiomegaly and pulmonary venous congestion. 2. Low volumes with basilar opacity representing atelectasis on prior CT Electronically Signed   By: Marnee Spring M.D.   On: 04/09/2015 08:19  Dg Chest 2 View  04/07/2015  CLINICAL DATA:  Chest pain, shortness of breath and palpitations. Onset this evening. EXAM: CHEST  2 VIEW COMPARISON:  06/17/2009 FINDINGS: Progressive cardiomegaly. Unchanged tortuosity of the thoracic aorta. Indistinctness of perihilar vasculature suggesting pulmonary edema. Bibasilar opacities may be atelectasis or vascular in etiology. No pleural effusion. No pneumothorax. No acute osseous abnormalities are seen. IMPRESSION: Increase cardiomegaly with probable pulmonary edema.  Findings suggest mild CHF. Bibasilar opacities may be atelectasis or vascular in etiology. Electronically Signed   By: Rubye OaksMelanie  Ehinger M.D.   On: 04/07/2015 23:27   Ct Angio Chest Pe W/cm &/or Wo Cm  04/08/2015  CLINICAL DATA:  70 year old female with dyspnea and chest pain EXAM: CT ANGIOGRAPHY CHEST WITH CONTRAST TECHNIQUE: Multidetector CT imaging of the chest was performed using the standard protocol during bolus administration of intravenous contrast. Multiplanar CT image reconstructions and MIPs were obtained to evaluate the vascular anatomy. CONTRAST:  100mL OMNIPAQUE IOHEXOL 350 MG/ML SOLN COMPARISON:  Radiograph dated 04/07/2015 FINDINGS: Evaluation of this exam is limited due to respiratory motion artifact. Evaluation is also limited due to streak artifact caused by patient's arms. There is mild emphysematous changes of the lungs. There is patchy areas of ground-glass opacities with mosaic appearance of the lungs compatible with small airways versus small vessel disease. Pneumonia is less likely but not excluded. Clinical correlation is recommended. There is no focal consolidation, pleural effusion, or pneumothorax. The central airways are patent. The thoracic aorta appears unremarkable. Evaluation of the pulmonary arteries is limited due to suboptimal visualization of the peripheral branches. No central pulmonary artery embolus identified. Top-normal cardiac size. No pericardial effusion. The esophagus is grossly unremarkable. No thyroid nodule. There is no axillary adenopathy. The chest wall soft tissues appear unremarkable. There is degenerative changes of the spine. No acute fracture. The visualized upper abdomen appears unremarkable. Review of the MIP images confirms the above findings. IMPRESSION: No definite CT evidence of central pulmonary embolus. Diffuse airspace haziness with areas of air trapping likely within small airways versus small vessel disease. Clinical correlation is recommended.  Electronically Signed   By: Elgie CollardArash  Radparvar M.D.   On: 04/08/2015 01:36     Assessment and Plan  70 y.o. female    1. Acute respiratory distress with hypoxia: -CT scan with no definite evidence of PE, if high suspicion may need to treat empirically as she remains hypoxic and tachycardic  ------sx consistent with acute diastolic CHF -mild improvement on lasix 40 mg IV x 1 yesterday, Still with rattle and rales in chest (and on my exam) ---will give lasix 40 mg IV x 2 more dose today (10 am adn 4 pm) Then would wean oxygen, ambulate ---she will need outpt CHF clinic, follow up in our clinic Daily weights, lasix 20 mg daily and extra dose after lunch for weight gain  2) chronic headaches, Previously seen by neurology Takes pain meds at home  3) fibromyalgia Managed by primary care  Signed, Dossie Arbourim Mikahla Wisor, MD [facility] 04/09/2015, 10:02 AM

## 2015-04-09 NOTE — Care Management (Signed)
Patient did not qualify for home 02 today.  Discussed with primary nurse to check again before day of discharge.

## 2015-04-09 NOTE — Consult Note (Addendum)
Pulmonary Critical Care  Initial Consult Note   Billie Trager Garciagarcia BJY:[id number] DOB: [birth year] DOA: 04/07/2015  Referring physician: Angelica Ran, MD PCP: Megan Mans, MD   Chief Complaint: Shortness of breath  HPI: Chanler Schreiter Seith is a 70 y.o. female with prior history of HTN presented to the ED with Chest pain. Patient had a recent echo and cardiac workup which was apparently normal. Patient has noted increased shortness of breath with exertion. She has had the pain associated with the pain. Patient is now pain free. She was not feeling well for several days prior to admission. She is not sure if she was exposed to anyone that was ill. Her workup has shown a negative troponin and she is still significantly hypoxic. Patient had a CXR done which shows increased interstitial prominence suggesting possibly a viral etiology. She has been started on levaquin. CT scan shows diffuse airway haziness present and no PE   Review of Systems:  Constitutional:  No weight loss, night sweats, Fevers, chills, fatigue.  HEENT:  No headaches, nasal congestion, post nasal drip,  Cardio-vascular:  +chest pain, no swelling in lower extremities  GI:  No heartburn, indigestion, abdominal pain, nausea, vomiting, diarrhea  Resp:  +shortness of breath +productive cough, No coughing up of blood Skin:  no rash or lesions.  Musculoskeletal:  No joint pain or swelling.   Remainder ROS performed and is unremarkable other than noted in HPI  Past Medical History  Diagnosis Date  . Hypertension    Past Surgical History  Procedure Laterality Date  . Abdominal hysterectomy  [id number]  . Appendectomy     Social History:  reports that she has never smoked. She does not have any smokeless tobacco history on file. She reports that she does not drink alcohol or use illicit drugs.  Allergies  Allergen Reactions  . Sulfa Antibiotics     Per Mother  . Lodine  [Etodolac] Rash    Abdominal rash.    Family  History  Problem Relation Age of Onset  . Ulcers Father   . Heart attack Paternal Grandfather   . Hypertension Mother   . Congestive Heart Failure Mother   . Breast cancer Maternal Aunt     Prior to Admission medications   Medication Sig Start Date End Date Taking? Authorizing Provider  butalbital-aspirin-caffeine [facility]) 50-325-40 MG capsule Take 1 capsule by mouth 2 (two) times daily as needed for headache. 01/13/15  Yes Richard Hulen Shouts., MD  esomeprazole (NEXIUM) 40 MG capsule Take 1 capsule (40 mg total) by mouth daily. 01/13/15  Yes Richard Hulen Shouts., MD  hydrochlorothiazide (HYDRODIURIL) 25 MG tablet Take 1 tablet (25 mg total) by mouth daily. 01/13/15  Yes Richard Hulen Shouts., MD  levothyroxine (SYNTHROID, LEVOTHROID) 25 MCG tablet Take 1 tablet (25 mcg total) by mouth daily. 01/13/15  Yes Richard Hulen Shouts., MD  LORazepam (ATIVAN) 1 MG tablet Take 1 tablet (1 mg total) by mouth at bedtime. 01/13/15  Yes Richard Hulen Shouts., MD  PARoxetine (PAXIL) 40 MG tablet Take 1 tablet (40 mg total) by mouth daily. 01/13/15  Yes Richard Hulen Shouts., MD  quinapril (ACCUPRIL) 40 MG tablet Take 1 tablet (40 mg total) by mouth daily. 01/13/15  Yes Richard Hulen Shouts., MD  simethicone (MYLICON) 80 MG chewable tablet Chew 80 mg by mouth daily as needed for flatulence.   Yes Historical Provider, MD  levofloxacin (LEVAQUIN) 750 MG tablet Take 1 tablet (750 mg total)  by mouth daily. 04/08/15   Adrian SaranSital Mody, MD   Physical Exam: Filed Vitals:   04/09/15 0900 04/09/15 1100 04/09/15 1102 04/09/15 1142  BP: 106/60   116/73  Pulse: 93   92  Temp:      TempSrc:      Resp:    18  Height:      Weight:      SpO2:  91% 90%     Wt Readings from Last 3 Encounters:  04/08/15 92.488 kg (203 lb 14.4 oz)  01/13/15 93.895 kg (207 lb)  07/15/14 95.709 kg (211 lb)    General:  Appears calm and comfortable Eyes: PERRL, normal lids, irises & conjunctiva ENT: grossly normal hearing, lips &  tongue Neck: no LAD, masses or thyromegaly Cardiovascular: RRR, no m/r/g. No LE edema. Respiratory: CTA bilaterally, no w/r/r. Normal respiratory effort. Abdomen: soft, nontender Skin: no rash or induration seen on limited exam Musculoskeletal: grossly normal tone BUE/BLE Psychiatric: grossly normal mood and affect Neurologic: grossly non-focal.          Labs on Admission:  Basic Metabolic Panel:  Recent Labs Lab 04/07/15 2231 04/09/15 0451  NA 138 132*  K 3.7 4.5  CL 101 95*  CO2 27 28  GLUCOSE 139* 126*  BUN 13 17  CREATININE 1.10* 0.97  CALCIUM 9.0 8.9   Liver Function Tests: No results for input(s): AST, ALT, ALKPHOS, BILITOT, PROT, ALBUMIN in the last 168 hours. No results for input(s): LIPASE, AMYLASE in the last 168 hours. No results for input(s): AMMONIA in the last 168 hours. CBC:  Recent Labs Lab 04/07/15 2231  WBC 16.8*  HGB 14.8  HCT 44.9  MCV 90.3  PLT 256   Cardiac Enzymes:  Recent Labs Lab 04/07/15 2231 04/08/15 0146  TROPONINI <0.03 <0.03    BNP (last 3 results)  Recent Labs  04/08/15 0740  BNP 63.0    ProBNP (last 3 results) No results for input(s): PROBNP in the last 8760 hours.  CBG: No results for input(s): GLUCAP in the last 168 hours.  Radiological Exams on Admission: Dg Chest 1 View  04/09/2015  CLINICAL DATA:  Hypoxia EXAM: CHEST 1 VIEW COMPARISON:  04/07/2015 FINDINGS: Cardiomegaly, vascular pedicle widening, and aortic tortuosity as seen on recent chest CT. There are low lung volumes with interstitial opacities at the bases. Blunting of the lateral left costophrenic sulcus is likely fat pad based on CT. No definitive effusion. No pneumothorax. IMPRESSION: 1. Cardiomegaly and pulmonary venous congestion. 2. Low volumes with basilar opacity representing atelectasis on prior CT Electronically Signed   By: Marnee SpringJonathon  Watts M.D.   On: 04/09/2015 08:19   Dg Chest 2 View  04/07/2015  CLINICAL DATA:  Chest pain, shortness of  breath and palpitations. Onset this evening. EXAM: CHEST  2 VIEW COMPARISON:  06/17/2009 FINDINGS: Progressive cardiomegaly. Unchanged tortuosity of the thoracic aorta. Indistinctness of perihilar vasculature suggesting pulmonary edema. Bibasilar opacities may be atelectasis or vascular in etiology. No pleural effusion. No pneumothorax. No acute osseous abnormalities are seen. IMPRESSION: Increase cardiomegaly with probable pulmonary edema. Findings suggest mild CHF. Bibasilar opacities may be atelectasis or vascular in etiology. Electronically Signed   By: Rubye OaksMelanie  Ehinger M.D.   On: 04/07/2015 23:27   Ct Angio Chest Pe W/cm &/or Wo Cm  04/08/2015  CLINICAL DATA:  70 year old female with dyspnea and chest pain EXAM: CT ANGIOGRAPHY CHEST WITH CONTRAST TECHNIQUE: Multidetector CT imaging of the chest was performed using the standard protocol during bolus administration of intravenous  contrast. Multiplanar CT image reconstructions and MIPs were obtained to evaluate the vascular anatomy. CONTRAST:  OMNIPAQUE IOHEXOL 350 MG/ML SOLN COMPARISON:  Radiograph dated 04/07/2015 FINDINGS: Evaluation of this exam is limited due to respiratory motion artifact. Evaluation is also limited due to streak artifact caused by patient's arms. There is mild emphysematous changes of the lungs. There is patchy areas of ground-glass opacities with mosaic appearance of the lungs compatible with small airways versus small vessel disease. Pneumonia is less likely but not excluded. Clinical correlation is recommended. There is no focal consolidation, pleural effusion, or pneumothorax. The central airways are patent. The thoracic aorta appears unremarkable. Evaluation of the pulmonary arteries is limited due to suboptimal visualization of the peripheral branches. No central pulmonary artery embolus identified. Top-normal cardiac size. No pericardial effusion. The esophagus is grossly unremarkable. No thyroid nodule. There is no axillary  adenopathy. The chest wall soft tissues appear unremarkable. There is degenerative changes of the spine. No acute fracture. The visualized upper abdomen appears unremarkable. Review of the MIP images confirms the above findings. IMPRESSION: No definite CT evidence of central pulmonary embolus. Diffuse airspace haziness with areas of air trapping likely within small airways versus small vessel disease. Clinical correlation is recommended. Electronically Signed   By: Elgie Collard M.D.   On: 04/08/2015 01:36       EKG: Independently reviewed.  Assessment/Plan Active Problems:   Chest pain   Pneumonia   SOB (shortness of breath)   Hypoxia   Acute diastolic CHF (congestive heart failure) (HCC)   Morbid obesity due to excess calories (HCC)   Fibromyalgia   1. Acute respiratory failure with hypoxia -oxygen as needed -would get an ABG now -May need oxygen at home on discharge -would monitor CXR as needed  2. Pneumoniitis -continue with levaquin -consider adding steroids orally  3. Congestive heart Failure -last echo results noted  -again likely infectious etiology to explain the CXR findings  Code Status: full code   Disposition Plan: home   Patient should follow up in the office in 1-2 weeks after discharge  I have personally obtained a history, examined the patient, evaluated laboratory and imaging results, formulated the assessment and plan and placed orders.  The Patient requires high complexity decision making for assessment and support.    Yevonne Pax, MD [facility] Pulmonary Critical Care Medicine Sleep Medicine

## 2015-04-10 ENCOUNTER — Inpatient Hospital Stay: Payer: PPO

## 2015-04-10 LAB — CBC
HEMATOCRIT: 39.9 % (ref 35.0–47.0)
Hemoglobin: 13.6 g/dL (ref 12.0–16.0)
MCH: 31.1 pg (ref 26.0–34.0)
MCHC: 34 g/dL (ref 32.0–36.0)
MCV: 91.2 fL (ref 80.0–100.0)
PLATELETS: 219 10*3/uL (ref 150–440)
RBC: 4.37 MIL/uL (ref 3.80–5.20)
RDW: 13.1 % (ref 11.5–14.5)
WBC: 11 10*3/uL (ref 3.6–11.0)

## 2015-04-10 LAB — BASIC METABOLIC PANEL
Anion gap: 5 (ref 5–15)
BUN: 21 mg/dL — AB (ref 6–20)
CHLORIDE: 100 mmol/L — AB (ref 101–111)
CO2: 31 mmol/L (ref 22–32)
CREATININE: 1 mg/dL (ref 0.44–1.00)
Calcium: 9.2 mg/dL (ref 8.9–10.3)
GFR calc Af Amer: >60 mL/min (ref >60)
GFR calc non Af Amer: 56 mL/min — ABNORMAL LOW (ref >60)
GLUCOSE: 113 mg/dL — AB (ref 65–99)
POTASSIUM: 5.1 mmol/L (ref 3.5–5.1)
SODIUM: 136 mmol/L (ref 135–145)

## 2015-04-10 NOTE — Progress Notes (Signed)
[facility] - [place] at [facility]   PATIENT NAME: Latoya Cordova    MR#:  [id number]  DATE OF BIRTH:  08/28/1945  SUBJECTIVE:   Patient is doing well this am. Feels unstable in gait this am.   REVIEW OF SYSTEMS:    Review of Systems  Constitutional: Negative for fever, chills and malaise/fatigue.  HENT: Negative for congestion, ear discharge, nosebleeds and sore throat.   Eyes: Negative for blurred vision.  Respiratory: Negative for cough, hemoptysis, wheezing and stridor.   Cardiovascular: Negative for chest pain, palpitations and leg swelling.  Gastrointestinal: Negative for nausea, vomiting, abdominal pain, diarrhea and blood in stool.  Genitourinary: Negative for dysuria.  Musculoskeletal: Negative for back pain.  Neurological: Negative for dizziness and tremors.  Endo/Heme/Allergies: Does not bruise/bleed easily.    Tolerating Diet: Yes      DRUG ALLERGIES:   Allergies  Allergen Reactions  . Sulfa Antibiotics     Per Mother  . Lodine  [Etodolac] Rash    Abdominal rash.    VITALS:  Blood pressure 98/52, pulse 89, temperature 98 F (36.7 C), temperature source Oral, resp. rate 18, height 5\' 2"  (1.575 m), weight 92.488 kg (203 lb 14.4 oz), SpO2 95 %.  PHYSICAL EXAMINATION:   Physical Exam  Constitutional: She is oriented to person, place, and time and well-developed, well-nourished, and in no distress. No distress.  HENT:  Head: Normocephalic.  Eyes: No scleral icterus.  Neck: Normal range of motion. Neck supple. No JVD present. No tracheal deviation present.  Cardiovascular: Normal rate, regular rhythm and normal heart sounds.  Exam reveals no gallop and no friction rub.   No murmur heard. Pulmonary/Chest: Effort normal and breath sounds normal. No respiratory distress. She has no wheezes. She has no rales. She exhibits no tenderness.  Abdominal: Soft. Bowel sounds are normal. She exhibits no distension and no mass. There is no  tenderness. There is no rebound and no guarding.  Musculoskeletal: Normal range of motion. She exhibits no edema.  Neurological: She is alert and oriented to person, place, and time.  Skin: Skin is warm. No rash noted. No erythema.  Psychiatric: Affect and judgment normal.      LABORATORY PANEL:   CBC  Recent Labs Lab 04/10/15 0522  WBC 11.0  HGB 13.6  HCT 39.9  PLT 219   ------------------------------------------------------------------------------------------------------------------  Chemistries   Recent Labs Lab 04/10/15 0522  NA 136  K 5.1  CL 100*  CO2 31  GLUCOSE 113*  BUN 21*  CREATININE 1.00  CALCIUM 9.2   ------------------------------------------------------------------------------------------------------------------  Cardiac Enzymes  Recent Labs Lab 04/07/15 2231 04/08/15 0146  TROPONINI <0.03 <0.03   ------------------------------------------------------------------------------------------------------------------  RADIOLOGY:  Dg Chest 1 View  04/09/2015  CLINICAL DATA:  Hypoxia EXAM: CHEST 1 VIEW COMPARISON:  04/07/2015 FINDINGS: Cardiomegaly, vascular pedicle widening, and aortic tortuosity as seen on recent chest CT. There are low lung volumes with interstitial opacities at the bases. Blunting of the lateral left costophrenic sulcus is likely fat pad based on CT. No definitive effusion. No pneumothorax. IMPRESSION: 1. Cardiomegaly and pulmonary venous congestion. 2. Low volumes with basilar opacity representing atelectasis on prior CT Electronically Signed   By: Marnee Spring M.D.   On: 04/09/2015 08:19     ASSESSMENT AND PLAN:   70 year old female with history of hypertension who presented with chest pain upon of acute hypoxic respiratory failure.  1. Acute hypoxic respiratory failure: CT scan of the chest is negative for pulmonary emboli however does  show possible CHF versus pneumonia. BNP is normal. Repeat echocardiogram during this  admission shows a normal ejection fraction and no valvular abnormalities. There is no evidence of congestive heart failure.  I am suspecting patient may have a viral pneumonitis or pneumonia. I appreciate pulmonary consult.  She has improved but will need one more night int he hospital O2 is 90% RA.   2. CAP: Continue Levaquin. I suspect her leukocytosis is due to pneumonia.  3. Essential hypertension: Continue quinapril.  4. Depression/anxiety: Continue Paxil  5. Hypothyroid: Continue Synthroid. TSH is within normal limits. 6. Hyponatremia: I suspect this is related to diuresis.  7. Headache: Resolved.  Management plans discussed with the patient and she is in agreement.  CODE STATUS: FULL TOTAL TIME TAKING CARE OF THIS PATIENT: 28 minutes.     POSSIBLE D/C tomorrow. DEPENDING ON CLINICAL CONDITION.   Garlene Apperson M.D on 04/10/2015 at 9:35 AM  Between 7am to 6pm - Pager - [phone] After 6pm go to [email or web address] - password [email or web address] [facility]  [facility]  Office  [phone]  CC: Primary care physician; Megan Mansichard Gilbert Jr, MD  Note: This dictation was prepared with Dragon dictation along with smaller phrase technology. Any transcriptional errors that result from this process are unintentional.

## 2015-04-10 NOTE — Progress Notes (Signed)
SATURATION QUALIFICATIONS: (This note is used to comply with regulatory documentation for home oxygen)  Patient Saturations on Room Air at Rest = 94%  Patient Saturations on Room Air while Ambulating = 90%  Patient Saturations on na Liters of oxygen while Ambulating = na%  Please briefly explain why patient needs home oxygen: does not qualify

## 2015-04-10 NOTE — Evaluation (Signed)
Physical Therapy Evaluation Patient Details Name: Latoya Cordova MRN: [id number] DOB: 1945-05-12 Today's Date: 04/10/2015   History of Present Illness  Pt is here with chest pain, she reports that it is much better but that she has still had some chest pain but generally is feeling much better.  Clinical Impression  Pt does well with PT exam, but is a little slow and guarded with prolonged ambulation.  Her HR increases to the mid 120s with the effort and she does report some minor fatigue.  She has no safety concerns and no reports of increased chest pain but overall reports that she is weaker and more cautious than her baseline.     Follow Up Recommendations  [facility] program)    Equipment Recommendations  None recommended by PT    Recommendations for Other Services       Precautions / Restrictions Precautions Precautions: Fall Restrictions Weight Bearing Restrictions: No      Mobility  Bed Mobility Overal bed mobility: Independent             General bed mobility comments: No issues getting to/from EOB  Transfers Overall transfer level: Independent Equipment used: None             General transfer comment: Pt able to rise to standing confidently and w/o UE use  Ambulation/Gait Ambulation/Gait assistance: Supervision Ambulation Distance (Feet): 250 Feet Assistive device: None       General Gait Details: Pt does well with ambulation showing slow, but confident cadence.  She does have some HR increase (from low 100s to mid 120s) with the effort but does not feel overly fatigued and has no safety issues.   Stairs            Wheelchair Mobility    Modified Rankin (Stroke Patients Only)       Balance Overall balance assessment: Independent                                           Pertinent Vitals/Pain Pain Assessment: No/denies pain (fleeting minimal chest pain)    Home Living Family/patient expects to be  discharged to:: Private residence Living Arrangements: Alone Available Help at Discharge:  (son can check in regularly) Type of Home: House Home Access: Level entry              Prior Function Level of Independence: Independent         Comments: Pt out of the house multiple times a week, active     Hand Dominance        Extremity/Trunk Assessment   Upper Extremity Assessment: Overall WFL for tasks assessed           Lower Extremity Assessment: Overall WFL for tasks assessed         Communication   Communication: No difficulties  Cognition Arousal/Alertness: Awake/alert Behavior During Therapy: WFL for tasks assessed/performed Overall Cognitive Status: Within Functional Limits for tasks assessed                      General Comments      Exercises        Assessment/Plan    PT Assessment Patient needs continued PT services  PT Diagnosis Difficulty walking;Generalized weakness   PT Problem List Decreased balance;Decreased strength;Decreased activity tolerance;Decreased mobility;Pain  PT Treatment Interventions Gait training;Stair training;Functional mobility training;Therapeutic exercise;Therapeutic activities;Balance training  PT Goals (Current goals can be found in the Care Plan section) Acute Rehab PT Goals Patient Stated Goal: "Go home" PT Goal Formulation: With patient Time For Goal Achievement: 04/17/15 Potential to Achieve Goals: Good    Frequency Min 2X/week   Barriers to discharge        Co-evaluation               End of Session Equipment Utilized During Treatment: Gait belt Activity Tolerance: Patient tolerated treatment well Patient left: in bed;with call bell/phone within reach           Time: 1311-1332 PT Time Calculation (min) (ACUTE ONLY): 21 min   Charges:   PT Evaluation $PT Eval Low Complexity: 1 Procedure     PT G Codes:       Loran SentersGalen Nicholai Willette, PT, DPT [phone]  Malachi ProGalen R Dupree Givler 04/10/2015, 3:13  PM

## 2015-04-11 NOTE — Discharge Instructions (Signed)
Heart Failure Clinic appointment on April 30, 2015 at 11:00am with Latoya Kindredina Hackney, FNP. Please call [phone] to reschedule.   Heart Failure Heart failure is a condition in which the heart has trouble pumping blood. This means your heart does not pump blood efficiently for your body to work well. In some cases of heart failure, fluid may back up into your lungs or you may have swelling (edema) in your lower legs. Heart failure is usually a long-term (chronic) condition. It is important for you to take good care of yourself and follow your health care provider's treatment plan. CAUSES  Some health conditions can cause heart failure. Those health conditions include:  High blood pressure (hypertension). Hypertension causes the heart muscle to work harder than normal. When pressure in the blood vessels is high, the heart needs to pump (contract) with more force in order to circulate blood throughout the body. High blood pressure eventually causes the heart to become stiff and weak.  Coronary artery disease (CAD). CAD is the buildup of cholesterol and fat (plaque) in the arteries of the heart. The blockage in the arteries deprives the heart muscle of oxygen and blood. This can cause chest pain and may lead to a heart attack. High blood pressure can also contribute to CAD.  Heart attack (myocardial infarction). A heart attack occurs when one or more arteries in the heart become blocked. The loss of oxygen damages the muscle tissue of the heart. When this happens, part of the heart muscle dies. The injured tissue does not contract as well and weakens the heart's ability to pump blood.  Abnormal heart valves. When the heart valves do not open and close properly, it can cause heart failure. This makes the heart muscle pump harder to keep the blood flowing.  Heart muscle disease (cardiomyopathy or myocarditis). Heart muscle disease is damage to the heart muscle from a variety of causes. These can include  drug or alcohol abuse, infections, or unknown reasons. These can increase the risk of heart failure.  Lung disease. Lung disease makes the heart work harder because the lungs do not work properly. This can cause a strain on the heart, leading it to fail.  Diabetes. Diabetes increases the risk of heart failure. High blood sugar contributes to high fat (lipid) levels in the blood. Diabetes can also cause slow damage to tiny blood vessels that carry important nutrients to the heart muscle. When the heart does not get enough oxygen and food, it can cause the heart to become weak and stiff. This leads to a heart that does not contract efficiently.  Other conditions can contribute to heart failure. These include abnormal heart rhythms, thyroid problems, and low blood counts (anemia). Certain unhealthy behaviors can increase the risk of heart failure, including:  Being overweight.  Smoking or chewing tobacco.  Eating foods high in fat and cholesterol.  Abusing illicit drugs or alcohol.  Lacking physical activity. SYMPTOMS  Heart failure symptoms may vary and can be hard to detect. Symptoms may include:  Shortness of breath with activity, such as climbing stairs.  Persistent cough.  Swelling of the feet, ankles, legs, or abdomen.  Unexplained weight gain.  Difficulty breathing when lying flat (orthopnea).  Waking from sleep because of the need to sit up and get more air.  Rapid heartbeat.  Fatigue and loss of energy.  Feeling light-headed, dizzy, or close to fainting.  Loss of appetite.  Nausea.  Increased urination during the night (nocturia). DIAGNOSIS  A diagnosis of  heart failure is based on your history, symptoms, physical examination, and diagnostic tests. Diagnostic tests for heart failure may include:  Echocardiography.  Electrocardiography.  Chest X-ray.  Blood tests.  Exercise stress test.  Cardiac angiography.  Radionuclide scans. TREATMENT  Treatment  is aimed at managing the symptoms of heart failure. Medicines, behavioral changes, or surgical intervention may be necessary to treat heart failure.  Medicines to help treat heart failure may include:  Angiotensin-converting enzyme (ACE) inhibitors. This type of medicine blocks the effects of a blood protein called angiotensin-converting enzyme. ACE inhibitors relax (dilate) the blood vessels and help lower blood pressure.  Angiotensin receptor blockers (ARBs). This type of medicine blocks the actions of a blood protein called angiotensin. Angiotensin receptor blockers dilate the blood vessels and help lower blood pressure.  Water pills (diuretics). Diuretics cause the kidneys to remove salt and water from the blood. The extra fluid is removed through urination. This loss of extra fluid lowers the volume of blood the heart pumps.  Beta blockers. These prevent the heart from beating too fast and improve heart muscle strength.  Digitalis. This increases the force of the heartbeat.  Healthy behavior changes include:  Obtaining and maintaining a healthy weight.  Stopping smoking or chewing tobacco.  Eating heart-healthy foods.  Limiting or avoiding alcohol.  Stopping illicit drug use.  Physical activity as directed by your health care provider.  Surgical treatment for heart failure may include:  A procedure to open blocked arteries, repair damaged heart valves, or remove damaged heart muscle tissue.  A pacemaker to improve heart muscle function and control certain abnormal heart rhythms.  An internal cardioverter defibrillator to treat certain serious abnormal heart rhythms.  A left ventricular assist device (LVAD) to assist the pumping ability of the heart. HOME CARE INSTRUCTIONS   Take medicines only as directed by your health care provider. Medicines are important in reducing the workload of your heart, slowing the progression of heart failure, and improving your symptoms.  Do  not stop taking your medicine unless directed by your health care provider.  Do not skip any dose of medicine.  Refill your prescriptions before you run out of medicine. Your medicines are needed every day.  Engage in moderate physical activity if directed by your health care provider. Moderate physical activity can benefit some people. The elderly and people with severe heart failure should consult with a health care provider for physical activity recommendations.  Eat heart-healthy foods. Food choices should be free of trans fat and low in saturated fat, cholesterol, and salt (sodium). Healthy choices include fresh or frozen fruits and vegetables, fish, lean meats, legumes, fat-free or low-fat dairy products, and whole grain or high fiber foods. Talk to a dietitian to learn more about heart-healthy foods.  Limit sodium if directed by your health care provider. Sodium restriction may reduce symptoms of heart failure in some people. Talk to a dietitian to learn more about heart-healthy seasonings.  Use healthy cooking methods. Healthy cooking methods include roasting, grilling, broiling, baking, poaching, steaming, or stir-frying. Talk to a dietitian to learn more about healthy cooking methods.  Limit fluids if directed by your health care provider. Fluid restriction may reduce symptoms of heart failure in some people.  Weigh yourself every day. Daily weights are important in the early recognition of excess fluid. You should weigh yourself every morning after you urinate and before you eat breakfast. Wear the same amount of clothing each time you weigh yourself. Record your daily weight. Provide  your health care provider with your weight record.  Monitor and record your blood pressure if directed by your health care provider.  Check your pulse if directed by your health care provider.  Lose weight if directed by your health care provider. Weight loss may reduce symptoms of heart failure in some  people.  Stop smoking or chewing tobacco. Nicotine makes your heart work harder by causing your blood vessels to constrict. Do not use nicotine gum or patches before talking to your health care provider.  Keep all follow-up visits as directed by your health care provider. This is important.  Limit alcohol intake to no more than 1 drink per day for nonpregnant women and 2 drinks per day for men. One drink equals 12 ounces of beer, 5 ounces of wine, or 1 ounces of hard liquor. Drinking more than that is harmful to your heart. Tell your health care provider if you drink alcohol several times a week. Talk with your health care provider about whether alcohol is safe for you. If your heart has already been damaged by alcohol or you have severe heart failure, drinking alcohol should be stopped completely.  Stop illicit drug use.  Stay up-to-date with immunizations. It is especially important to prevent respiratory infections through current pneumococcal and influenza immunizations.  Manage other health conditions such as hypertension, diabetes, thyroid disease, or abnormal heart rhythms as directed by your health care provider.  Learn to manage stress.  Plan rest periods when fatigued.  Learn strategies to manage high temperatures. If the weather is extremely hot:  Avoid vigorous physical activity.  Use air conditioning or fans or seek a cooler location.  Avoid caffeine and alcohol.  Wear loose-fitting, lightweight, and light-colored clothing.  Learn strategies to manage cold temperatures. If the weather is extremely cold:  Avoid vigorous physical activity.  Layer clothes.  Wear mittens or gloves, a hat, and a scarf when going outside.  Avoid alcohol.  Obtain ongoing education and support as needed.  Participate in or seek rehabilitation as needed to maintain or improve independence and quality of life. SEEK MEDICAL CARE IF:   You have a rapid weight gain.  You have increasing  shortness of breath that is unusual for you.  You are unable to participate in your usual physical activities.  You tire easily.  You cough more than normal, especially with physical activity.  You have any or more swelling in areas such as your hands, feet, ankles, or abdomen.  You are unable to sleep because it is hard to breathe.  You feel like your heart is beating fast (palpitations).  You become dizzy or light-headed upon standing up. SEEK IMMEDIATE MEDICAL CARE IF:   You have difficulty breathing.  There is a change in mental status such as decreased alertness or difficulty with concentration.  You have a pain or discomfort in your chest.  You have an episode of fainting (syncope). MAKE SURE YOU:   Understand these instructions.  Will watch your condition.  Will get help right away if you are not doing well or get worse.   This information is not intended to replace advice given to you by your health care provider. Make sure you discuss any questions you have with your health care provider.   Document Released: 03/13/2005 Document Revised: 07/28/2014 Document Reviewed: 04/12/2012 Elsevier Interactive Patient Education [employer]

## 2015-04-11 NOTE — Progress Notes (Signed)
Patient discharged via wheelchair and private vehicle. IV removed and catheter intact. All discharge instructions given and patient verbalizes understanding. Tele removed and returned. No prescriptions given to patient No distress noted.   

## 2015-04-11 NOTE — Care Management Note (Signed)
Case Management Note  Patient Details  Name: Latoya BenderSylvia K Cordova MRN: [id number] Date of Birth: 1946/02/10  Subjective/Objective:     Spoke to son Midge AverJamie Zakarian on the phone about his concerns with his Mother's living situation,livres alone, home is cluttered, mold, ammonia smell etc. Explained to him that a home health RN, PT as well as a Child psychotherapistsocial worker referral was being made, and that the Child psychotherapistsocial worker would evaluate the home for safety/health issues. Son Asher MuirJamie agreed with this plan and chose [facility] as the home health services provider. A referral was faxed to [facility] requesting RN, PT, SW home health services.                Action/Plan:   Expected Discharge Date:                  Expected Discharge Plan:     In-House Referral:     Discharge planning Services     Post Acute Care Choice:    Choice offered to:     DME Arranged:    DME Agency:     HH Arranged:    HH Agency:     Status of Service:     Medicare Important Message Given:    Date Medicare IM Given:    Medicare IM give by:    Date Additional Medicare IM Given:    Additional Medicare Important Message give by:     If discussed at Long Length of Stay Meetings, dates discussed:    Additional Comments:  Calleigh Lafontant A, RN 04/11/2015, 9:28 AM

## 2015-04-11 NOTE — Discharge Summary (Signed)
[facility] - [place] at [facility]   PATIENT NAME: Latoya Cordova    MR#:  [id number]  DATE OF BIRTH:  1945-04-12  DATE OF ADMISSION:  04/07/2015 ADMITTING PHYSICIAN: Wyatt Haste, MD  DATE OF DISCHARGE: 04/11/2015  PRIMARY CARE PHYSICIAN: Megan Mans, MD    ADMISSION DIAGNOSIS:  Shortness of breath   DISCHARGE DIAGNOSIS:  Active Problems:   Chest pain   Pneumonia   SOB (shortness of breath)   Hypoxia   Acute diastolic CHF (congestive heart failure) (HCC)   Morbid obesity due to excess calories (HCC)   Fibromyalgia   SECONDARY DIAGNOSIS:   Past Medical History  Diagnosis Date  . Hypertension     HOSPITAL COURSE:   70 year old female with history of hypertension who presented with chest pain upon of acute hypoxic respiratory failure.  1. Acute hypoxic respiratory failure: CT scan of the chest is negative for pulmonary emboli however does show possible CHF versus pneumonia. BNP is normal. Repeat echocardiogram during this admission shows a normal ejection fraction and no valvular abnormalities. There is no evidence of congestive heart failure.  I am suspecting patient may have a viral pneumonitis or pneumonia. I appreciate pulmonary consult.  She has improved and is a longer hypoxic.   2. CAP: Continue Levaquin. I suspect her leukocytosis is due to pneumonia.  3. Essential hypertension: Continue quinapril.  4. Depression/anxiety: Continue Paxil  5. Hypothyroid: Continue Synthroid. TSH is within normal limits. 6. Hyponatremia: I suspect this is related to diuresis.  7. Headache: Resolved.  Patient's son had several concerns about the patient. He is concerned that his mom may be addicted to narcotics. He is also concerned of her living situation. He feels that patient likely has mold in her house. He is also concerned that the patient is hoarding things. My suggestion to the patient's son was to speak with her primary care physician  about the narcotics. I have spoken personally to case management about her living situation. We will order home health and social worker to evaluate the home situation. I suggested to the patient's son that it would be in the patient's best interest that she stay with him and have the house evaluated for mold. This could be the etiology of her respiratory distress/failure.   DISCHARGE CONDITIONS AND DIET:  Stable condition Heart healthy  CONSULTS OBTAINED:  Treatment Team:  Wyatt Haste, MD Antonieta Iba, MD Yevonne Pax, MD  DRUG ALLERGIES:   Allergies  Allergen Reactions  . Sulfa Antibiotics     Per Mother  . Lodine  [Etodolac] Rash    Abdominal rash.    DISCHARGE MEDICATIONS:   Current Discharge Medication List    START taking these medications   Details  levofloxacin (LEVAQUIN) 750 MG tablet Take 1 tablet (750 mg total) by mouth daily. Qty: 5 tablet, Refills: 0      CONTINUE these medications which have NOT CHANGED   Details  butalbital-aspirin-caffeine (FIORINAL) 50-325-40 MG capsule Take 1 capsule by mouth 2 (two) times daily as needed for headache. Qty: 55 capsule, Refills: 5   Associated Diagnoses: Chronic nonintractable headache, unspecified headache type    esomeprazole (NEXIUM) 40 MG capsule Take 1 capsule (40 mg total) by mouth daily. Qty: 30 capsule, Refills: 12   Associated Diagnoses: Gastroesophageal reflux disease, esophagitis presence not specified    hydrochlorothiazide (HYDRODIURIL) 25 MG tablet Take 1 tablet (25 mg total) by mouth daily. Qty: 30 tablet, Refills: 12   Associated  Diagnoses: Essential (primary) hypertension    levothyroxine (SYNTHROID, LEVOTHROID) 25 MCG tablet Take 1 tablet (25 mcg total) by mouth daily. Qty: 30 tablet, Refills: 12   Associated Diagnoses: Hypothyroidism, unspecified hypothyroidism type    LORazepam (ATIVAN) 1 MG tablet Take 1 tablet (1 mg total) by mouth at bedtime. Qty: 30 tablet, Refills: 5   Associated  Diagnoses: Anxiety, generalized    PARoxetine (PAXIL) 40 MG tablet Take 1 tablet (40 mg total) by mouth daily. Qty: 30 tablet, Refills: 12   Associated Diagnoses: Clinical depression    quinapril (ACCUPRIL) 40 MG tablet Take 1 tablet (40 mg total) by mouth daily. Qty: 30 tablet, Refills: 12   Associated Diagnoses: Essential (primary) hypertension    simethicone (MYLICON) 80 MG chewable tablet Chew 80 mg by mouth daily as needed for flatulence.              Today   CHIEF COMPLAINT:   Patient is doing well this point. Patient denies as directed. Patient and I had a long conversation this morning. It appears the patient stays at home by herself and is enjoying the company and the friend she is making here in the hospital.   VITAL SIGNS:  Blood pressure 116/58, pulse 86, temperature 97.6 F (36.4 C), temperature source Oral, resp. rate 18, height 5\' 2"  (1.575 m), weight 92.488 kg (203 lb 14.4 oz), SpO2 97 %.   REVIEW OF SYSTEMS:  Review of Systems  Constitutional: Negative for fever, chills and malaise/fatigue.  HENT: Negative for sore throat.   Eyes: Negative for blurred vision.  Respiratory: Negative for cough, hemoptysis, shortness of breath and wheezing.   Cardiovascular: Negative for chest pain, palpitations and leg swelling.  Gastrointestinal: Negative for nausea, vomiting, abdominal pain, diarrhea and blood in stool.  Genitourinary: Negative for dysuria.  Musculoskeletal: Negative for back pain.  Neurological: Negative for dizziness, tremors and headaches.  Endo/Heme/Allergies: Does not bruise/bleed easily.     PHYSICAL EXAMINATION:  GENERAL:  70 y.o.-year-old patient lying in the bed with no acute distress.  NECK:  Supple, no jugular venous distention. No thyroid enlargement, no tenderness.  LUNGS: Normal breath sounds bilaterally, no wheezing, rales,rhonchi  No use of accessory muscles of respiration.  CARDIOVASCULAR: S1, S2 normal. No murmurs, rubs, or  gallops.  ABDOMEN: Soft, non-tender, non-distended. Bowel sounds present. No organomegaly or mass.  EXTREMITIES: No pedal edema, cyanosis, or clubbing.  PSYCHIATRIC: The patient is alert and oriented x 3.  SKIN: No obvious rash, lesion, or ulcer.   DATA REVIEW:   CBC  Recent Labs Lab 04/10/15 0522  WBC 11.0  HGB 13.6  HCT 39.9  PLT 219    Chemistries   Recent Labs Lab 04/10/15 0522  NA 136  K 5.1  CL 100*  CO2 31  GLUCOSE 113*  BUN 21*  CREATININE 1.00  CALCIUM 9.2    Cardiac Enzymes  Recent Labs Lab 04/07/15 2231 04/08/15 0146  TROPONINI <0.03 <0.03    Microbiology Results  @MICRORSLT48 @  RADIOLOGY:  Dg Chest 1 View  04/10/2015  CLINICAL DATA:  Patient hospitalized adjusted very poor shortness of breath. Hypoxia. EXAM: CHEST 1 VIEW COMPARISON:  04/09/2015 FINDINGS: Cardiac silhouette mildly enlarged. The aorta is uncoiled. No mediastinal or hilar masses or convincing adenopathy. Mild linear/ reticular opacity at lung bases likely atelectasis/scarring. Lungs otherwise clear with no convincing pneumonia or pulmonary edema. No pleural effusion or pneumothorax. Bony thorax is grossly intact. IMPRESSION: No acute cardiopulmonary disease. Electronically Signed   By: Onalee Huaavid  Ormond M.D.   On: 04/10/2015 11:04      Management plans discussed with the patient and she is in agreement. Stable for discharge home with [facility]  Patient should follow up with PCP in1  week  CODE STATUS:     Code Status Orders        Start     Ordered   04/08/15 0204  Full code   Continuous     04/08/15 0205    Code Status History    Date Active Date Inactive Code Status Order ID Comments User Context   This patient has a current code status but no historical code status.      TOTAL TIME TAKING CARE OF THIS PATIENT: 35 minutes.    Note: This dictation was prepared with Dragon dictation along with smaller phrase technology. Any transcriptional errors that result from this  process are unintentional.  Brodrick Curran M.D on 04/11/2015 at 11:08 AM  Between 7am to 6pm - Pager - [phone] After 6pm go to [email or web address] - password [email or web address] [facility]  [place]  [facility]  Office  [phone]  CC: Primary care physician; Megan Mans, MD

## 2015-04-13 LAB — CULTURE, BLOOD (ROUTINE X 2)
CULTURE: NO GROWTH
Culture: NO GROWTH

## 2015-04-14 ENCOUNTER — Ambulatory Visit (INDEPENDENT_AMBULATORY_CARE_PROVIDER_SITE_OTHER): Payer: PPO | Admitting: Family Medicine

## 2015-04-14 ENCOUNTER — Encounter: Payer: Self-pay | Admitting: Family Medicine

## 2015-04-14 VITALS — BP 108/64 | HR 110 | Temp 97.7°F | Resp 18 | Wt 201.0 lb

## 2015-04-14 DIAGNOSIS — F32A Depression, unspecified: Secondary | ICD-10-CM

## 2015-04-14 DIAGNOSIS — R0602 Shortness of breath: Secondary | ICD-10-CM | POA: Diagnosis not present

## 2015-04-14 DIAGNOSIS — E871 Hypo-osmolality and hyponatremia: Secondary | ICD-10-CM | POA: Diagnosis not present

## 2015-04-14 DIAGNOSIS — J189 Pneumonia, unspecified organism: Secondary | ICD-10-CM

## 2015-04-14 DIAGNOSIS — D72829 Elevated white blood cell count, unspecified: Secondary | ICD-10-CM

## 2015-04-14 DIAGNOSIS — R0902 Hypoxemia: Secondary | ICD-10-CM | POA: Diagnosis not present

## 2015-04-14 DIAGNOSIS — F329 Major depressive disorder, single episode, unspecified: Secondary | ICD-10-CM | POA: Diagnosis not present

## 2015-04-14 MED ORDER — BUPROPION HCL ER (XL) 150 MG PO TB24: 150.0000 mg | ORAL_TABLET | Freq: Every day | ORAL | Status: DC

## 2015-04-14 NOTE — Progress Notes (Signed)
Patient ID: Latoya Cordova, female   DOB: 07-03-1945, 70 y.o.   MRN: [id number]    Subjective:  HPI  Admission date- 04/07/15 Discharge date- 04/11/15  Diagnoses- Chest pain pneumonia SOB Hypoxia Acute diastolic CHF Obesity Fibromyalgia   Pt had labs, CT and echo. Echo showed no evidence of CHF.  Med changes were Levaquin.  Leukocytosis was suppected due to Pneumonia (need to recheck CBC?) Hyponatremia suspected due to diuresis.  Depression screen PHQ 2/9 04/14/2015  Decreased Interest 1  Down, Depressed, Hopeless 1  PHQ - 2 Score 2  Altered sleeping 3  Tired, decreased energy 3  Change in appetite 1  Feeling bad or failure about yourself  1  Trouble concentrating 0  Moving slowly or fidgety/restless 0  Suicidal thoughts 0  PHQ-9 Score 10  Difficult doing work/chores Very difficult     Prior to Admission medications   Medication Sig Start Date End Date Taking? Authorizing Provider  butalbital-aspirin-caffeine [facility]) 50-325-40 MG capsule Take 1 capsule by mouth 2 (two) times daily as needed for headache. 01/13/15   Kaylea Mounsey Hulen Shouts., MD  esomeprazole (NEXIUM) 40 MG capsule Take 1 capsule (40 mg total) by mouth daily. 01/13/15   Simaya Lumadue Hulen Shouts., MD  hydrochlorothiazide (HYDRODIURIL) 25 MG tablet Take 1 tablet (25 mg total) by mouth daily. 01/13/15   Kolbey Teichert Hulen Shouts., MD  levofloxacin (LEVAQUIN) 750 MG tablet Take 1 tablet (750 mg total) by mouth daily. 04/08/15   Adrian Saran, MD  levothyroxine (SYNTHROID, LEVOTHROID) 25 MCG tablet Take 1 tablet (25 mcg total) by mouth daily. 01/13/15   Odes Lolli Hulen Shouts., MD  LORazepam (ATIVAN) 1 MG tablet Take 1 tablet (1 mg total) by mouth at bedtime. 01/13/15   Shekina Cordell Hulen Shouts., MD  PARoxetine (PAXIL) 40 MG tablet Take 1 tablet (40 mg total) by mouth daily. 01/13/15   Moncia Annas Hulen Shouts., MD  quinapril (ACCUPRIL) 40 MG tablet Take 1 tablet (40 mg total) by mouth daily. 01/13/15   Presten Joost Hulen Shouts., MD    simethicone (MYLICON) 80 MG chewable tablet Chew 80 mg by mouth daily as needed for flatulence.    Historical Provider, MD    Patient Active Problem List   Diagnosis Date Noted  . Chest pain 04/08/2015  . Pneumonia 04/08/2015  . SOB (shortness of breath)   . Hypoxia   . Acute diastolic CHF (congestive heart failure) (HCC)   . Morbid obesity due to excess calories (HCC)   . Fibromyalgia   . Allergic rhinitis 01/12/2015  . Clinical depression 01/12/2015  . Essential (primary) hypertension 01/12/2015  . Anxiety, generalized 01/12/2015  . Fibrositis 01/12/2015  . Acid reflux 01/12/2015  . Need for prophylactic hormone replacement therapy (postmenopausal) 01/12/2015  . HLD (hyperlipidemia) 01/12/2015  . Adult hypothyroidism 01/12/2015  . Arthritis, degenerative 01/12/2015  . Cardiac murmur 01/12/2015  . Osteopenia 01/12/2015  . Adiposity 01/12/2015    Past Medical History  Diagnosis Date  . Hypertension     Social History   Social History  . Marital Status: Widowed    Spouse Name: N/A  . Number of Children: N/A  . Years of Education: N/A   Occupational History  . Not on file.   Social History Main Topics  . Smoking status: Never Smoker   . Smokeless tobacco: Not on file  . Alcohol Use: No  . Drug Use: No  . Sexual Activity: Not on file   Other Topics Concern  . Not on  file   Social History Narrative    Allergies  Allergen Reactions  . Sulfa Antibiotics     Per Mother  . Lodine  [Etodolac] Rash    Abdominal rash.    Review of Systems  Constitutional: Positive for malaise/fatigue.  HENT: Negative.   Eyes: Negative.   Respiratory: Positive for cough and shortness of breath.   Cardiovascular: Negative.   Gastrointestinal: Negative.   Genitourinary: Negative.   Musculoskeletal: Negative.   Skin: Negative.   Neurological: Positive for weakness.  Endo/Heme/Allergies: Negative.   Psychiatric/Behavioral: Positive for depression.    Immunization  History  Administered Date(s) Administered  . Influenza-Unspecified 02/24/2013  . Pneumococcal Conjugate-13 08/08/2013  . Pneumococcal Polysaccharide-23 05/24/2011  . Tdap 12/13/2006   Objective:  There were no vitals taken for this visit.  Physical Exam  Constitutional: She is oriented to person, place, and time and well-developed, well-nourished, and in no distress.  Eyes: Conjunctivae and EOM are normal. Pupils are equal, round, and reactive to light.  Neck: Normal range of motion. Neck supple.  Cardiovascular: Normal rate, regular rhythm, normal heart sounds and intact distal pulses.   Pulmonary/Chest: Effort normal and breath sounds normal.  Musculoskeletal: Normal range of motion.  Neurological: She is alert and oriented to person, place, and time. She has normal reflexes. Gait normal. GCS score is 15.  Skin: Skin is warm and dry.  Psychiatric: Mood, memory, affect and judgment normal.    Lab Results  Component Value Date   WBC 11.0 04/10/2015   HGB 13.6 04/10/2015   HCT 39.9 04/10/2015   PLT 219 04/10/2015   GLUCOSE 113* 04/10/2015   CHOL 196 01/19/2015   TRIG 229* 01/19/2015   HDL 44 01/19/2015   LDLCALC 106* 01/19/2015   TSH 1.635 04/08/2015    CMP     Component Value Date/Time   NA 136 04/10/2015 0522   NA 139 01/19/2015 1021   K 5.1 04/10/2015 0522   CL 100* 04/10/2015 0522   CO2 31 04/10/2015 0522   GLUCOSE 113* 04/10/2015 0522   GLUCOSE 103* 01/19/2015 1021   BUN 21* 04/10/2015 0522   BUN 18 01/19/2015 1021   CREATININE 1.00 04/10/2015 0522   CREATININE 1.1 03/12/2014   CALCIUM 9.2 04/10/2015 0522   PROT 8.2 01/19/2015 1021   ALBUMIN 4.7 01/19/2015 1021   AST 32 01/19/2015 1021   ALT 27 01/19/2015 1021   ALKPHOS 79 01/19/2015 1021   BILITOT 0.5 01/19/2015 1021   GFRNONAA 56* 04/10/2015 0522   GFRAA >60 04/10/2015 0522    Assessment and Plan :  1. Pneumonia, unspecified laterality, unspecified part of lung Clinically patient improving.  Fatigue will improve.  2. Hypoxia Improving. 93% today  3. SOB (shortness of breath) improving  4. Hyponatremia Probably secondary to pneumonia - Renal function panel  5. Leukocytosis  - CBC with Differential/Platelet  6. Depression Worsening. If not improved will refer to physiatry. This is been an issue her entire adult life. It worsened when her husband died 20 years ago. Worsen again when she retired last year. - buPROPion (WELLBUTRIN XL) 150 MG 24 hr tablet; Take 1 tablet (150 mg total) by mouth daily.  Dispense: 30 tablet; Refill: 12 7. Osteoarthritis Evidently son is concerned about her use of narcotics. He has taken about 2 Vicodin a week so she and I talked about this a little bit. At this dosage she is at very low risk of addiction but the risk of falls from sedation is higher at her  age.  Patient was seen and examined by Dr. Julieanne Manson, and noted scribed by Dimas Chyle, CMA  Julieanne Manson MD [facility] 04/14/2015 3:23 PM

## 2015-04-15 ENCOUNTER — Inpatient Hospital Stay: Payer: Medicare Other | Admitting: Family Medicine

## 2015-04-17 ENCOUNTER — Encounter: Payer: Self-pay | Admitting: Physician Assistant

## 2015-04-17 DIAGNOSIS — I5189 Other ill-defined heart diseases: Secondary | ICD-10-CM | POA: Insufficient documentation

## 2015-04-17 DIAGNOSIS — I10 Essential (primary) hypertension: Secondary | ICD-10-CM | POA: Insufficient documentation

## 2015-04-18 NOTE — Progress Notes (Signed)
Cardiology Office Note Date:  04/19/2015  Patient ID:  Aseel, [place] 1945/08/10, MRN [id number] PCP:  Megan Mans, MD  Cardiologist:  Dr. Mariah Milling, MD    Chief Complaint: Hospital follow up, SOB  History of Present Illness: Latoya Cordova is a 70 y.o. female with history of HTN, obesity, and anxiety who presents for hospital follow up after her recent admission from 1/12 to 1/15 for acute respiratory distress in the setting of likely pneumonitis vs PNA vs possible mild diastolic CHF.   She was previously seen by Dr. Myrtis Ser in 2004 for chest pain. She apparently underwent an echo at that time that she reports being normal, though results are not on file. She most recently underwent echo in June 2016 by per PCP 2/2 murmur that showed EF 50-55%, mild MR. She reports decreased exercise tolerance over the years being able to ambulate 1/4 of a mile back in the summer of 2016. This has decreased considerably most recently. Her weight she reports has remained stable. She denies any LEE. She does sleep in a recliner and has done so for at least 10 years 2/2 orthopnea. Admitted to [facility] for bilateral posterior shoulder pain and posterior neck pain which led to SOB and chest pain. Cardiac enzymes were negative. BNP was 63, WBC 16.3, ECG was non-acute. CTA was negative for PE, though there was motion artifact. CXR showed increased interstital prominence suggesting possibly a viral etiology. Echo showed EF 60-65%, RWMA could be be excluded, GR1DD, ascending aorta mildly dilated at 37 mm, normal right sided pressure. She did received IV Lasix with some improvement in symptoms, though she continued to desaturate with ambulation on room air. She was started empirically on Levaquin. She was seen by pulmonary who felt her signs and symptoms were c/w pneuomonitis and consider adding steroids. Her oxygen saturations improved without need of steroids.   She comes in today stating her SOB is improved from when  she was in the hospital. She is only SOB with exertion, not at rest. She also notes a dry cough. No chest pain, tachy-palpitations, nausea, vomiting, or diaphoresis. She was seen by her PCP and started on Wellbutrin. He also recommended a referral to psychiatry. She has a long family history of anxiety and depression. 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 She denies any increased lower extremity edema, orthopnea (sleeps in a recliner at baseline), PND, or early satiety. Her BP has been well controlled.     Past Medical History  Diagnosis Date  . Hypertension   . Morbid obesity (HCC)   . Anxiety   . Diastolic dysfunction     a. echo 08/2014: EF 50-55%, mild MR; b. echo 03/2015: EF 60-65%, RWMA could not be excluded, GR1DD, ascending aorta mildly dilated at 37mm, normal right-sided pressure    Past Surgical History  Procedure Laterality Date  . Abdominal hysterectomy  [id number]  . Appendectomy      Current Outpatient Prescriptions  Medication Sig Dispense Refill  . buPROPion (WELLBUTRIN XL) 150 MG 24 hr tablet Take 1 tablet (150 mg total) by mouth daily. 30 tablet 12  . butalbital-aspirin-caffeine (FIORINAL) 50-325-40 MG capsule Take 1 capsule by mouth 2 (two) times daily as needed for headache. 55 capsule 5  . esomeprazole (NEXIUM) 40 MG capsule Take 1 capsule (40 mg total) by mouth daily. 30 capsule 12  . hydrochlorothiazide (HYDRODIURIL) 25 MG tablet Take 1 tablet (25 mg total) by mouth  daily. 30 tablet 12  . HYDROcodone-acetaminophen (NORCO/VICODIN) 5-325 MG tablet take 1 TO 2 tablet by mouth if needed for pain  0  . levothyroxine (SYNTHROID, LEVOTHROID) 25 MCG tablet Take 1 tablet (25 mcg total) by mouth daily. 30 tablet 12  . LORazepam (ATIVAN) 1 MG tablet Take 1 tablet (1 mg total) by mouth at bedtime. 30 tablet 5  . PARoxetine (PAXIL) 40 MG tablet Take 1 tablet (40 mg total) by mouth daily. 30 tablet 12  . quinapril (ACCUPRIL) 40 MG  tablet Take 1 tablet (40 mg total) by mouth daily. 30 tablet 12  . simethicone (MYLICON) 80 MG chewable tablet Chew 80 mg by mouth daily as needed for flatulence.     No current facility-administered medications for this visit.    Allergies:   Sulfa antibiotics and Lodine    Social History:  The patient  reports that she has never smoked. She does not have any smokeless tobacco history on file. She reports that she does not drink alcohol or use illicit drugs.   Family History:  The patient's family history includes Breast cancer in her maternal aunt; Congestive Heart Failure in her mother; Heart attack in her paternal grandfather; Hypertension in her mother; Ulcers in her father.  ROS:   Review of Systems  Constitutional: Positive for weight loss and malaise/fatigue. Negative for fever, chills and diaphoresis.  HENT: Negative for congestion.   Eyes: Negative for discharge and redness.  Respiratory: Positive for cough and shortness of breath. Negative for hemoptysis, sputum production and wheezing.        Dry cough  Cardiovascular: Negative for chest pain, palpitations, orthopnea, claudication, leg swelling and PND.  Gastrointestinal: Negative for heartburn, nausea, vomiting and abdominal pain.  Musculoskeletal: Negative for myalgias and falls.  Skin: Negative for rash.  Neurological: Negative for dizziness, tingling, tremors, sensory change, speech change, focal weakness, loss of consciousness and weakness.  Endo/Heme/Allergies: Does not bruise/bleed easily.  Psychiatric/Behavioral: The patient is not nervous/anxious.   All other systems reviewed and are negative.     PHYSICAL EXAM:  VS:  BP 100/54 mmHg  Pulse 106  Ht  (1.575 m)  Wt 200 lb 4 oz (90.833 kg)  BMI 36.62 kg/m2  SpO2 95% BMI: Body mass index is 36.62 kg/(m^2). Well nourished, well developed, in no acute distress HEENT: normocephalic, atraumatic Neck: no JVD, carotid bruits or masses Cardiac:  Tachycardic,  normal S1, S2; RRR; no murmurs, rubs, or gallops Lungs:  clear to auscultation bilaterally, no wheezing, rhonchi or rales Abd: soft, nontender, no hepatomegaly, + BS MS: no deformity or atrophy Ext: no edema Skin: warm and dry, no rash Neuro:  moves all extremities spontaneously, no focal abnormalities noted, follows commands Psych: euthymic mood, full affect   EKG:  Was ordered today. Shows sinus tachycardia, 106 bpm, left axis deviation, poor R wave progression, no significant st/t changes  Recent Labs: 01/19/2015: ALT 27 04/08/2015: B Natriuretic Peptide 63.0; TSH 1.635 04/10/2015: BUN 21*; Creatinine, Ser 1.00; Hemoglobin 13.6; Platelets 219; Potassium 5.1; Sodium 136  01/19/2015: Cholesterol, Total 196; HDL 44; LDL Calculated 106*; Triglycerides 229*   Estimated Creatinine Clearance: 54.9 mL/min (by C-G formula based on Cr of 1).   Wt Readings from Last 3 Encounters:  04/19/15 200 lb 4 oz (90.833 kg)  04/14/15 201 lb (91.173 kg)  04/08/15 203 lb 14.4 oz (92.488 kg)     Other studies reviewed: Additional studies/records reviewed today include: summarized above  ASSESSMENT AND PLAN:  1. SOB  with possible pneumonitis:   -Improving -Completed Levaquin -She has not yet been seen by pulmonology in hospital follow up. She does not have an appointment at this time -Referral to pulmonology for hospital follow up of pneumonitis and her SOB -Less likely CHF at this time with normal BNP and echo with normal LVSF, normal wall motion, and normal right-sided pressure.  -Will trend BNP and bmet -Pulse ox 95% on room air today -?Reflux  2. HTN:  -Well controlled -Continue HCTZ 25 mg daily -Should she need any diuresis for a component of diastolic CHF would stop HCTZ and start Lasix and not use these two together  -Accupril 40 mg daily  3. Obesity/possible OHS:  -Possible deconditioning playing a role in #1 -Weight loss advised  4. Anxiety: -She self admits to this and self  admits to this playing a role in #1 -Per PCP -Knows she needs something to occupy her time  Disposition: F/u with me in 2 months  Current medicines are reviewed at length with the patient today.  The patient did not have any concerns regarding medicines.  Elinor Dodge PA-C 04/19/2015 12:17 PM     [facility] [place] [address] [place], [place] [address] [phone]

## 2015-04-19 ENCOUNTER — Ambulatory Visit (INDEPENDENT_AMBULATORY_CARE_PROVIDER_SITE_OTHER): Payer: PPO | Admitting: Physician Assistant

## 2015-04-19 ENCOUNTER — Encounter: Payer: Self-pay | Admitting: Physician Assistant

## 2015-04-19 VITALS — BP 100/54 | HR 106 | Ht 62.0 in | Wt 200.2 lb

## 2015-04-19 DIAGNOSIS — J189 Pneumonia, unspecified organism: Secondary | ICD-10-CM | POA: Diagnosis not present

## 2015-04-19 DIAGNOSIS — R0602 Shortness of breath: Secondary | ICD-10-CM | POA: Diagnosis not present

## 2015-04-19 DIAGNOSIS — F419 Anxiety disorder, unspecified: Secondary | ICD-10-CM

## 2015-04-19 DIAGNOSIS — I10 Essential (primary) hypertension: Secondary | ICD-10-CM

## 2015-04-19 DIAGNOSIS — I5032 Chronic diastolic (congestive) heart failure: Secondary | ICD-10-CM

## 2015-04-19 NOTE — Patient Instructions (Addendum)
Medication Instructions:  Your physician recommends that you continue on your current medications as directed. Please refer to the Current Medication list given to you today.   Labwork: BMET and BNP today  Testing/Procedures: none  Follow-Up: Your physician recommends that you schedule a follow-up appointment in: two months with Eula Listen, PA-C   Any Other Special Instructions Will Be Listed Below (If Applicable). Referral to pulmonary for shortness of breath     If you need a refill on your cardiac medications before your next appointment, please call your pharmacy.

## 2015-04-21 ENCOUNTER — Other Ambulatory Visit: Payer: Self-pay

## 2015-04-21 DIAGNOSIS — E875 Hyperkalemia: Secondary | ICD-10-CM

## 2015-04-21 LAB — BASIC METABOLIC PANEL
BUN/Creatinine Ratio: 32 — ABNORMAL HIGH (ref 11–26)
BUN: 26 mg/dL (ref 8–27)
CALCIUM: 9.9 mg/dL (ref 8.7–10.3)
CHLORIDE: 100 mmol/L (ref 96–106)
CO2: 21 mmol/L (ref 18–29)
Creatinine, Ser: 0.82 mg/dL (ref 0.57–1.00)
GFR calc Af Amer: 84 mL/min/{1.73_m2} (ref >59)
GFR calc non Af Amer: 73 mL/min/{1.73_m2} (ref >59)
GLUCOSE: 92 mg/dL (ref 65–99)
POTASSIUM: 5.3 mmol/L — AB (ref 3.5–5.2)
Sodium: 141 mmol/L (ref 134–144)

## 2015-04-21 LAB — BRAIN NATRIURETIC PEPTIDE: BNP: 16.8 pg/mL (ref 0.0–100.0)

## 2015-04-22 DIAGNOSIS — E871 Hypo-osmolality and hyponatremia: Secondary | ICD-10-CM | POA: Diagnosis not present

## 2015-04-22 DIAGNOSIS — D72829 Elevated white blood cell count, unspecified: Secondary | ICD-10-CM | POA: Diagnosis not present

## 2015-04-23 LAB — CBC WITH DIFFERENTIAL/PLATELET
BASOS ABS: 0 10*3/uL (ref 0.0–0.2)
Basos: 0 %
EOS (ABSOLUTE): 0.2 10*3/uL (ref 0.0–0.4)
Eos: 2 %
HEMOGLOBIN: 13.9 g/dL (ref 11.1–15.9)
Hematocrit: 41.9 % (ref 34.0–46.6)
IMMATURE GRANS (ABS): 0 10*3/uL (ref 0.0–0.1)
Immature Granulocytes: 0 %
LYMPHS: 41 %
Lymphocytes Absolute: 3.4 10*3/uL — ABNORMAL HIGH (ref 0.7–3.1)
MCH: 30.7 pg (ref 26.6–33.0)
MCHC: 33.2 g/dL (ref 31.5–35.7)
MCV: 93 fL (ref 79–97)
MONOCYTES: 7 %
Monocytes Absolute: 0.6 10*3/uL (ref 0.1–0.9)
NEUTROS ABS: 4.2 10*3/uL (ref 1.4–7.0)
Neutrophils: 50 %
Platelets: 269 10*3/uL (ref 150–379)
RBC: 4.53 x10E6/uL (ref 3.77–5.28)
RDW: 13.2 % (ref 12.3–15.4)
WBC: 8.4 10*3/uL (ref 3.4–10.8)

## 2015-04-23 LAB — RENAL FUNCTION PANEL
ALBUMIN: 4.5 g/dL (ref 3.5–4.8)
BUN/Creatinine Ratio: 21 (ref 11–26)
BUN: 17 mg/dL (ref 8–27)
CHLORIDE: 95 mmol/L — AB (ref 96–106)
CO2: 26 mmol/L (ref 18–29)
Calcium: 9.3 mg/dL (ref 8.7–10.3)
Creatinine, Ser: 0.82 mg/dL (ref 0.57–1.00)
GFR calc Af Amer: 84 mL/min/{1.73_m2} (ref >59)
GFR calc non Af Amer: 73 mL/min/{1.73_m2} (ref >59)
GLUCOSE: 108 mg/dL — AB (ref 65–99)
PHOSPHORUS: 3.9 mg/dL (ref 2.5–4.5)
POTASSIUM: 4.3 mmol/L (ref 3.5–5.2)
Sodium: 138 mmol/L (ref 134–144)

## 2015-04-27 ENCOUNTER — Other Ambulatory Visit (INDEPENDENT_AMBULATORY_CARE_PROVIDER_SITE_OTHER): Payer: PPO

## 2015-04-27 DIAGNOSIS — E875 Hyperkalemia: Secondary | ICD-10-CM

## 2015-04-28 LAB — BASIC METABOLIC PANEL
BUN/Creatinine Ratio: 19 (ref 11–26)
BUN: 17 mg/dL (ref 8–27)
CALCIUM: 9.5 mg/dL (ref 8.7–10.3)
CHLORIDE: 95 mmol/L — AB (ref 96–106)
CO2: 23 mmol/L (ref 18–29)
Creatinine, Ser: 0.91 mg/dL (ref 0.57–1.00)
GFR, EST AFRICAN AMERICAN: 74 mL/min/{1.73_m2} (ref >59)
GFR, EST NON AFRICAN AMERICAN: 64 mL/min/{1.73_m2} (ref >59)
Glucose: 120 mg/dL — ABNORMAL HIGH (ref 65–99)
POTASSIUM: 4.5 mmol/L (ref 3.5–5.2)
SODIUM: 140 mmol/L (ref 134–144)

## 2015-04-29 ENCOUNTER — Encounter: Payer: Self-pay | Admitting: Family Medicine

## 2015-04-29 ENCOUNTER — Ambulatory Visit (INDEPENDENT_AMBULATORY_CARE_PROVIDER_SITE_OTHER): Payer: PPO | Admitting: Family Medicine

## 2015-04-29 VITALS — BP 104/56 | HR 102 | Temp 97.7°F | Resp 18 | Wt 204.0 lb

## 2015-04-29 DIAGNOSIS — F329 Major depressive disorder, single episode, unspecified: Secondary | ICD-10-CM | POA: Diagnosis not present

## 2015-04-29 DIAGNOSIS — F32A Depression, unspecified: Secondary | ICD-10-CM

## 2015-04-29 NOTE — Progress Notes (Signed)
Patient ID: Latoya Cordova, female   DOB: 10-Nov-1945, 70 y.o.   MRN: [id number]    Subjective:  HPI Pt is here for a 2 week follow up of depression.Wellbutrin was started at [facility]. She reports that she feels a little better and her breathing is better. She is better emotionally and physically. She denies any side effects that she can tell.   Prior to Admission medications   Medication Sig Start Date End Date Taking? Authorizing Provider  buPROPion (WELLBUTRIN XL) 150 MG 24 hr tablet Take 1 tablet (150 mg total) by mouth daily. 04/14/15  Yes Richard Hulen Shouts., MD  butalbital-aspirin-caffeine [facility]) [phone] MG capsule Take 1 capsule by mouth 2 (two) times daily as needed for headache. 01/13/15  Yes Richard Hulen Shouts., MD  esomeprazole (NEXIUM) 40 MG capsule Take 1 capsule (40 mg total) by mouth daily. 01/13/15  Yes Richard Hulen Shouts., MD  hydrochlorothiazide (HYDRODIURIL) 25 MG tablet Take 1 tablet (25 mg total) by mouth daily. 01/13/15  Yes Richard Hulen Shouts., MD  HYDROcodone-acetaminophen (NORCO/VICODIN) 5-325 MG tablet take 1 TO 2 tablet by mouth if needed for pain 01/15/15  Yes Historical Provider, MD  levothyroxine (SYNTHROID, LEVOTHROID) 25 MCG tablet Take 1 tablet (25 mcg total) by mouth daily. 01/13/15  Yes Richard Hulen Shouts., MD  LORazepam (ATIVAN) 1 MG tablet Take 1 tablet (1 mg total) by mouth at bedtime. 01/13/15  Yes Richard Hulen Shouts., MD  PARoxetine (PAXIL) 40 MG tablet Take 1 tablet (40 mg total) by mouth daily. 01/13/15  Yes Richard Hulen Shouts., MD  simethicone (MYLICON) 80 MG chewable tablet Chew 80 mg by mouth daily as needed for flatulence.   Yes Historical Provider, MD  quinapril (ACCUPRIL) 40 MG tablet Take 1 tablet (40 mg total) by mouth daily. Patient not taking: Reported on 04/29/2015 01/13/15   Maple Hudson., MD    Patient Active Problem List   Diagnosis Date Noted  . Diastolic dysfunction   . Hypertension   . Chest pain 04/08/2015  .  Pneumonia 04/08/2015  . SOB (shortness of breath)   . Hypoxia   . Morbid obesity due to excess calories (HCC)   . Fibromyalgia   . Allergic rhinitis 01/12/2015  . Clinical depression 01/12/2015  . Essential (primary) hypertension 01/12/2015  . Anxiety, generalized 01/12/2015  . Fibrositis 01/12/2015  . Acid reflux 01/12/2015  . Need for prophylactic hormone replacement therapy (postmenopausal) 01/12/2015  . HLD (hyperlipidemia) 01/12/2015  . Adult hypothyroidism 01/12/2015  . Arthritis, degenerative 01/12/2015  . Cardiac murmur 01/12/2015  . Osteopenia 01/12/2015  . Adiposity 01/12/2015    Past Medical History  Diagnosis Date  . Hypertension   . Morbid obesity (HCC)   . Anxiety   . Diastolic dysfunction     a. echo 08/2014: EF 50-55%, mild MR; b. echo 03/2015: EF 60-65%, RWMA could not be excluded, GR1DD, ascending aorta mildly dilated at 37mm, normal right-sided pressure    Social History   Social History  . Marital Status: Widowed    Spouse Name: N/A  . Number of Children: N/A  . Years of Education: N/A   Occupational History  . Not on file.   Social History Main Topics  . Smoking status: Never Smoker   . Smokeless tobacco: Not on file  . Alcohol Use: No  . Drug Use: No  . Sexual Activity: Not on file   Other Topics Concern  . Not on file   Social  History Narrative    Allergies  Allergen Reactions  . Sulfa Antibiotics     Per Mother  . Lodine  [Etodolac] Rash    Abdominal rash.    Review of Systems  Constitutional: Positive for malaise/fatigue.  HENT: Negative.   Eyes: Negative.   Respiratory: Positive for shortness of breath.   Cardiovascular: Negative.   Gastrointestinal: Negative.   Genitourinary: Negative.   Musculoskeletal: Negative.   Skin: Negative.   Neurological: Negative.   Endo/Heme/Allergies: Negative.   Psychiatric/Behavioral: Positive for depression.    Immunization History  Administered Date(s) Administered  .  Influenza-Unspecified 02/24/2013  . Pneumococcal Conjugate-13 08/08/2013  . Pneumococcal Polysaccharide-23 05/24/2011  . Tdap 12/13/2006   Objective:  BP 104/56 mmHg  Pulse 102  Temp(Src) 97.7 F (36.5 C) (Oral)  Resp 18  Wt 204 lb (92.534 kg)  SpO2 95%  Physical Exam  Constitutional: She is oriented to person, place, and time and well-developed, well-nourished, and in no distress.  Eyes: Conjunctivae and EOM are normal. Pupils are equal, round, and reactive to light.  Neck: Normal range of motion. Neck supple.  Cardiovascular: Normal rate, regular rhythm, normal heart sounds and intact distal pulses.   Pulmonary/Chest: Effort normal and breath sounds normal.  Abdominal: Soft. Bowel sounds are normal.  Musculoskeletal: Normal range of motion.  Neurological: She is alert and oriented to person, place, and time. She has normal reflexes. Gait normal. GCS score is 15.  Skin: Skin is warm and dry.  Psychiatric: Mood, memory, affect and judgment normal.    Lab Results  Component Value Date   WBC 8.4 04/22/2015   HGB 13.6 04/10/2015   HCT 41.9 04/22/2015   PLT 269 04/22/2015   GLUCOSE 120* 04/27/2015   CHOL 196 01/19/2015   TRIG 229* 01/19/2015   HDL 44 01/19/2015   LDLCALC 106* 01/19/2015   TSH 1.635 04/08/2015    CMP     Component Value Date/Time   NA 140 04/27/2015 0903   NA 136 04/10/2015 0522   K 4.5 04/27/2015 0903   CL 95* 04/27/2015 0903   CO2 23 04/27/2015 0903   GLUCOSE 120* 04/27/2015 0903   GLUCOSE 113* 04/10/2015 0522   BUN 17 04/27/2015 0903   BUN 21* 04/10/2015 0522   CREATININE 0.91 04/27/2015 0903   CREATININE 1.1 03/12/2014   CALCIUM 9.5 04/27/2015 0903   PROT 8.2 01/19/2015 1021   ALBUMIN 4.5 04/22/2015 1202   AST 32 01/19/2015 1021   ALT 27 01/19/2015 1021   ALKPHOS 79 01/19/2015 1021   BILITOT 0.5 01/19/2015 1021   GFRNONAA 64 04/27/2015 0903   GFRAA 74 04/27/2015 0903    Assessment and Plan :  1. Clinical depression Pt has improved  with Wellbutrin. Will follow up as scheduled in 2 months. Call if not continuing to improved.  May still need psychiatric referral. She looks much better today. 2.HTN 3Chronic Headaches  I have done the exam and reviewed the above chart and it is accurate to the best of my knowledge.   Patient was seen and examined by Dr. Julieanne Manson, and noted scribed by Dimas Chyle, CMA  Julieanne Manson MD [facility] 04/29/2015 2:44 PM

## 2015-04-30 ENCOUNTER — Encounter: Payer: Self-pay | Admitting: Pulmonary Disease

## 2015-04-30 ENCOUNTER — Ambulatory Visit (INDEPENDENT_AMBULATORY_CARE_PROVIDER_SITE_OTHER): Payer: PPO | Admitting: Pulmonary Disease

## 2015-04-30 ENCOUNTER — Ambulatory Visit: Payer: PPO | Admitting: Family

## 2015-04-30 VITALS — BP 140/90 | HR 115 | Ht 62.0 in | Wt 202.8 lb

## 2015-04-30 DIAGNOSIS — J189 Pneumonia, unspecified organism: Secondary | ICD-10-CM | POA: Diagnosis not present

## 2015-05-05 NOTE — Progress Notes (Signed)
PULMONARY OFFICE POST HOSPITAL FOLLOW UP  S: 64 F hospitalized 1/11 - 1/15 with acute onset of dyspnea and chest pain. Her discharge diagnosis was pneumonia. During that hospitalization, she underwent a CT chest which revealed diffuse mosaic ground glass opacities and an echocardiogram which was normal. She was seen in consultation by Dr Freda Munro. She was also seen by cardiology. She was treated with antibiotics empirically. She was seen in follow up by cardiology 04/19/15 who arranged this office follow up. She is now much improved and believes that she is at or near her previous baseline with no new respiratory complaints  O: Filed Vitals:   04/30/15 1449  BP: 140/90  Pulse: 115  Height:  (1.575 m)  Weight: 91.989 kg (202 lb 12.8 oz)  SpO2: 94%    Gen: WDWN in NAD HEENT: All WNL Neck: NO LAN, no JVD noted Lungs: full BS, normal percussion note throughout, no adventitious sounds Cardiovascular: Reg rate, normal rhythm, no M noted Abdomen: Soft, NT +BS Ext: no C/C/E Neuro: CNs intact, motor/sens grossly intact Skin: No lesions noted  Previous CXR and CT chest reviewed  IMP: Her acute respiratory illness that led to her hospitalization was likely an acute viral pneumonitis. She has now recovered fully and has no ongoing pulmonary problems  PLAN: No further pulmonary eval or treatment is indicated @ this time.  Follow up as needed  Billy Fischer, MD PCCM service Mobile [phone] Pager [phone]

## 2015-06-17 ENCOUNTER — Encounter: Payer: Self-pay | Admitting: Cardiovascular Disease

## 2015-06-17 ENCOUNTER — Ambulatory Visit (INDEPENDENT_AMBULATORY_CARE_PROVIDER_SITE_OTHER): Payer: PPO | Admitting: Cardiovascular Disease

## 2015-06-17 VITALS — BP 110/70 | HR 109 | Ht 63.0 in | Wt 198.0 lb

## 2015-06-17 DIAGNOSIS — R000 Tachycardia, unspecified: Secondary | ICD-10-CM | POA: Diagnosis not present

## 2015-06-17 DIAGNOSIS — R011 Cardiac murmur, unspecified: Secondary | ICD-10-CM

## 2015-06-17 DIAGNOSIS — I10 Essential (primary) hypertension: Secondary | ICD-10-CM | POA: Diagnosis not present

## 2015-06-17 DIAGNOSIS — R079 Chest pain, unspecified: Secondary | ICD-10-CM

## 2015-06-17 MED ORDER — METOPROLOL TARTRATE 25 MG PO TABS: 25.0000 mg | ORAL_TABLET | Freq: Two times a day (BID) | ORAL | Status: DC

## 2015-06-17 NOTE — Assessment & Plan Note (Signed)
We have encouraged continued exercise, careful diet management in an effort to lose weight. 

## 2015-06-17 NOTE — Assessment & Plan Note (Signed)
Blood pressure well controlled, she does have tachycardia on a consistent basis Recommended she start metoprolol tartrate 12.5 mg twice a day with slow titration up to 25 mg twice a day if tolerated Goal heart rate less than 90

## 2015-06-17 NOTE — Assessment & Plan Note (Signed)
Persistent sinus tachycardia dating back into 2016 Recommended low-dose beta blocker with slow titration upwards Improved heart rate may help her chronic shortness of breath on exertion

## 2015-06-17 NOTE — Assessment & Plan Note (Signed)
No recent episodes of chest pain Review of CT scan of the chest showing no significant coronary atherosclerosis or PAD No further testing needed

## 2015-06-17 NOTE — Patient Instructions (Addendum)
You are doing well.  Please start metoprolol 1/2 pill twice a day Monitor your pulse rate if possible  Please call us if you have new issues that need to be addressed before your next appt.  Your physician wants you to follow-up in: 6 months.  You will receive a reminder letter in the mail two months in advance. If you don't receive a letter, please call our office to schedule the follow-up appointment.

## 2015-06-17 NOTE — Progress Notes (Signed)
Patient ID: Latoya Cordova, female    DOB: 12/14/1945, 70 y.o.   MRN: [id number]  HPI Comments: Latoya Cordova is a 70 y.o. female with history of HTN, obesity, and anxiety with previous admission to the hospital who presents for hospital from 1/12 to 04/11/15 for acute respiratory distress in the setting of likely pneumonitis vs PNA vs possible mild diastolic CHF.  She presents today for follow-up of her diastolic CHF, tachycardia Remote history of chest pain in 2004  In general she reports that she feels well She was told that her potassium was elevated and Accupril was held No regular exercise program, she does have some shortness of breath on exertion Denies significant leg edema, reports blood pressure has been well-controlled without Accupril  CT scan of the chest reviewed with her in detail including the images pulled up in clinic today, There is no significant coronary artery calcifications, no significant carotid, aortic atherosclerosis  Review of prior office notes shows she typically has heart rate more than 100 bpm at rest  EKG on today's visit shows sinus tachycardia with rate 109 bpm, nonspecific ST abnormality, left anterior fascicular block  Other past medical history CTA was negative for PE, though there was motion artifact. CXR showed increased interstital prominence suggesting possibly a viral etiology.  Previous Echo showed EF 60-65%, RWMA could be be excluded, GR1DD, ascending aorta mildly dilated at 37 mm, normal right sided pressure.    Allergies  Allergen Reactions  . Sulfa Antibiotics     Per Mother  . Lodine  [Etodolac] Rash    Abdominal rash.    Current Outpatient Prescriptions on File Prior to Visit  Medication Sig Dispense Refill  . buPROPion (WELLBUTRIN XL) 150 MG 24 hr tablet Take 1 tablet (150 mg total) by mouth daily. 30 tablet 12  . butalbital-aspirin-caffeine (FIORINAL) 50-325-40 MG capsule Take 1 capsule by mouth 2 (two) times daily as needed  for headache. 55 capsule 5  . esomeprazole (NEXIUM) 40 MG capsule Take 1 capsule (40 mg total) by mouth daily. 30 capsule 12  . hydrochlorothiazide (HYDRODIURIL) 25 MG tablet Take 1 tablet (25 mg total) by mouth daily. 30 tablet 12  . HYDROcodone-acetaminophen (NORCO/VICODIN) 5-325 MG tablet take 1 TO 2 tablet by mouth if needed for pain  0  . levothyroxine (SYNTHROID, LEVOTHROID) 25 MCG tablet Take 1 tablet (25 mcg total) by mouth daily. 30 tablet 12  . LORazepam (ATIVAN) 1 MG tablet Take 1 tablet (1 mg total) by mouth at bedtime. 30 tablet 5  . PARoxetine (PAXIL) 40 MG tablet Take 1 tablet (40 mg total) by mouth daily. 30 tablet 12  . simethicone (MYLICON) 80 MG chewable tablet Chew 80 mg by mouth daily as needed for flatulence.     No current facility-administered medications on file prior to visit.    Past Medical History  Diagnosis Date  . Hypertension   . Morbid obesity (HCC)   . Anxiety   . Diastolic dysfunction     a. echo 08/2014: EF 50-55%, mild MR; b. echo 03/2015: EF 60-65%, RWMA could not be excluded, GR1DD, ascending aorta mildly dilated at 37mm, normal right-sided pressure    Past Surgical History  Procedure Laterality Date  . Abdominal hysterectomy  08/18/2004  . Appendectomy      Social History  reports that she has never smoked. She does not have any smokeless tobacco history on file. She reports that she does not drink alcohol or use illicit drugs.  Family  History family history includes Breast cancer in her maternal aunt; Congestive Heart Failure in her mother; Heart attack in her paternal grandfather; Hypertension in her mother; Ulcers in her father.    Review of Systems  Constitutional: Negative.   HENT: Negative.   Respiratory: Positive for shortness of breath.   Cardiovascular: Negative.   Gastrointestinal: Negative.   Musculoskeletal: Negative.   Neurological: Negative.   Hematological: Negative.   Psychiatric/Behavioral: Negative.   All other  systems reviewed and are negative.   BP 110/70 mmHg  Pulse 109  Ht 5\' 3"  (1.6 m)  Wt 198 lb (89.812 kg)  BMI 35.08 kg/m2  Physical Exam  Constitutional: She is oriented to person, place, and time. She appears well-developed and well-nourished.  HENT:  Head: Normocephalic.  Nose: Nose normal.  Mouth/Throat: Oropharynx is clear and moist.  Eyes: Conjunctivae are normal. Pupils are equal, round, and reactive to light.  Neck: Normal range of motion. Neck supple. No JVD present.  Cardiovascular: Normal rate, regular rhythm, normal heart sounds and intact distal pulses.  Exam reveals no gallop and no friction rub.   No murmur heard. Pulmonary/Chest: Effort normal and breath sounds normal. No respiratory distress. She has no wheezes. She has no rales. She exhibits no tenderness.  Abdominal: Soft. Bowel sounds are normal. She exhibits no distension. There is no tenderness.  Musculoskeletal: Normal range of motion. She exhibits no edema or tenderness.  Lymphadenopathy:    She has no cervical adenopathy.  Neurological: She is alert and oriented to person, place, and time. Coordination normal.  Skin: Skin is warm and dry. No rash noted. No erythema.  Psychiatric: She has a normal mood and affect. Her behavior is normal. Judgment and thought content normal.

## 2015-06-18 ENCOUNTER — Other Ambulatory Visit: Payer: Self-pay | Admitting: Family Medicine

## 2015-06-18 DIAGNOSIS — Z1231 Encounter for screening mammogram for malignant neoplasm of breast: Secondary | ICD-10-CM

## 2015-07-07 ENCOUNTER — Ambulatory Visit
Admission: RE | Admit: 2015-07-07 | Discharge: 2015-07-07 | Disposition: A | Discharge status: Home / Self Care | Payer: PPO | Source: Ambulatory Visit | Attending: Family Medicine | Admitting: Family Medicine

## 2015-07-07 ENCOUNTER — Other Ambulatory Visit: Payer: Self-pay | Admitting: Family Medicine

## 2015-07-07 DIAGNOSIS — Z1231 Encounter for screening mammogram for malignant neoplasm of breast: Secondary | ICD-10-CM

## 2015-07-17 ENCOUNTER — Other Ambulatory Visit: Payer: Self-pay | Admitting: Family Medicine

## 2015-07-19 ENCOUNTER — Encounter: Payer: Medicare Other | Admitting: Family Medicine

## 2015-07-20 ENCOUNTER — Encounter: Payer: Self-pay | Admitting: Family Medicine

## 2015-08-31 ENCOUNTER — Encounter: Payer: Self-pay | Admitting: Family Medicine

## 2015-08-31 ENCOUNTER — Ambulatory Visit (INDEPENDENT_AMBULATORY_CARE_PROVIDER_SITE_OTHER): Payer: PPO | Admitting: Family Medicine

## 2015-08-31 VITALS — BP 112/64 | HR 72 | Temp 97.8°F | Resp 18 | Ht 63.0 in | Wt 206.0 lb

## 2015-08-31 DIAGNOSIS — R51 Headache: Secondary | ICD-10-CM

## 2015-08-31 DIAGNOSIS — Z0000 Encounter for general adult medical examination without abnormal findings: Secondary | ICD-10-CM | POA: Diagnosis not present

## 2015-08-31 DIAGNOSIS — G8929 Other chronic pain: Secondary | ICD-10-CM

## 2015-08-31 MED ORDER — HYDROCODONE-ACETAMINOPHEN 5-325 MG PO TABS: 1.0000 | ORAL_TABLET | Freq: Four times a day (QID) | ORAL | Status: DC | PRN

## 2015-08-31 MED ORDER — BUTALBITAL-ASPIRIN-CAFFEINE 50-325-40 MG PO CAPS: 1.0000 | ORAL_CAPSULE | Freq: Two times a day (BID) | ORAL | Status: DC | PRN

## 2015-08-31 NOTE — Progress Notes (Signed)
Patient ID: Latoya Cordova, female   DOB: 01/25/1946, [age] y.o.   MRN: [id number] Visit Date: 08/31/2015  Today's Provider: Megan Mansichard Gilbert Jr, MD   Chief Complaint  Patient presents with  . Medicare Wellness   Subjective:   Latoya BenderSylvia K Cordova is a 70 y.o. female who presents today for her Subsequent Annual Wellness Visit. She feels well. She reports she is not exercising. She reports she is sleeping well.  Mammogram- 07/07/15 ok Colonoscopy- 08/26/13 Diverticulosis, internal hemorrhoids repeat 10 years BMD_ 03/02/09 some osteopenia Pap- 11/18/02 normal  Immunization History  Administered Date(s) Administered  . Influenza-Unspecified 02/24/2013  . Pneumococcal Conjugate-13 08/08/2013  . Pneumococcal Polysaccharide-23 05/24/2011  . Tdap 12/13/2006     Review of Systems  Constitutional: Positive for fatigue.  Eyes: Positive for itching.  Respiratory: Positive for shortness of breath (when walking).   Cardiovascular: Negative.   Gastrointestinal: Negative.   Endocrine: Negative.   Genitourinary: Negative.   Musculoskeletal: Negative.   Skin: Negative.   Allergic/Immunologic: Negative.   Neurological: Positive for headaches.  Hematological: Negative.   Psychiatric/Behavioral: Negative.     Patient Active Problem List   Diagnosis Date Noted  . Sinus tachycardia (HCC) 06/17/2015  . Diastolic dysfunction   . Hypertension   . Chest pain 04/08/2015  . Pneumonia 04/08/2015  . SOB (shortness of breath)   . Hypoxia   . Morbid obesity due to excess calories (HCC)   . Fibromyalgia   . Allergic rhinitis 01/12/2015  . Clinical depression 01/12/2015  . Essential (primary) hypertension 01/12/2015  . Anxiety, generalized 01/12/2015  . Fibrositis 01/12/2015  . Acid reflux 01/12/2015  . Need for prophylactic hormone replacement therapy (postmenopausal) 01/12/2015  . HLD (hyperlipidemia) 01/12/2015  . Adult hypothyroidism 01/12/2015  . Arthritis, degenerative 01/12/2015  . Cardiac murmur  01/12/2015  . Osteopenia 01/12/2015  . Adiposity 01/12/2015    Social History   Social History  . Marital Status: Widowed    Spouse Name: N/A  . Number of Children: N/A  . Years of Education: N/A   Occupational History  . Not on file.   Social History Main Topics  . Smoking status: Never Smoker   . Smokeless tobacco: Not on file  . Alcohol Use: No  . Drug Use: No  . Sexual Activity: Not on file   Other Topics Concern  . Not on file   Social History Narrative    Past Surgical History  Procedure Laterality Date  . Abdominal hysterectomy  08/18/2004  . Appendectomy      Her family history includes Breast cancer in her cousin and maternal aunt; Congestive Heart Failure in her mother; Heart attack in her paternal grandfather; Hypertension in her mother; Ulcers in her father.    Outpatient Prescriptions Prior to Visit  Medication Sig Dispense Refill  . buPROPion (WELLBUTRIN XL) 150 MG 24 hr tablet Take 1 tablet (150 mg total) by mouth daily. 30 tablet 12  . butalbital-aspirin-caffeine (FIORINAL) 50-325-40 MG capsule Take 1 capsule by mouth 2 (two) times daily as needed for headache. 55 capsule 5  . esomeprazole (NEXIUM) 40 MG capsule Take 1 capsule (40 mg total) by mouth daily. 30 capsule 12  . hydrochlorothiazide (HYDRODIURIL) 25 MG tablet Take 1 tablet (25 mg total) by mouth daily. 30 tablet 12  . HYDROcodone-acetaminophen (NORCO/VICODIN) 5-325 MG tablet take 1 TO 2 tablet by mouth if needed for pain  0  . levothyroxine (SYNTHROID, LEVOTHROID) 25 MCG tablet Take 1 tablet (25 mcg total) by mouth daily. 30  tablet 12  . LORazepam (ATIVAN) 1 MG tablet Take 1 tablet (1 mg total) by mouth at bedtime as needed. 30 tablet 5  . metoprolol tartrate (LOPRESSOR) 25 MG tablet Take 1 tablet (25 mg total) by mouth 2 (two) times daily. (Patient taking differently: Take 12.5 mg by mouth 2 (two) times daily. ) 60 tablet 6  . PARoxetine (PAXIL) 40 MG tablet Take 1 tablet (40 mg total) by  mouth daily. 30 tablet 12  . simethicone (MYLICON) 80 MG chewable tablet Chew 80 mg by mouth daily as needed for flatulence.     No facility-administered medications prior to visit.    Allergies  Allergen Reactions  . Sulfa Antibiotics     Per Mother  . Lodine  [Etodolac] Rash    Abdominal rash.    Patient Care Team: Maple Hudson., MD as PCP - General (Family Medicine)  Objective:   Vitals:  Filed Vitals:   08/31/15 1103  BP: 112/64  Pulse: 72  Temp: 97.8 F (36.6 C)  TempSrc: Oral  Resp: 18  Height:  (1.6 m)  Weight: 206 lb (93.441 kg)  SpO2: 95%    Physical Exam  Constitutional: She is oriented to person, place, and time. She appears well-developed and well-nourished.  HENT:  Head: Normocephalic and atraumatic.  Right Ear: External ear normal.  Left Ear: External ear normal.  Nose: Nose normal.  Mouth/Throat: Oropharynx is clear and moist.  Eyes: Conjunctivae and EOM are normal. Pupils are equal, round, and reactive to light.  Neck: Normal range of motion. Neck supple.  Cardiovascular: Normal rate, regular rhythm, normal heart sounds and intact distal pulses.   Pulmonary/Chest: Effort normal and breath sounds normal.  Abdominal: Soft. Bowel sounds are normal.  Musculoskeletal: Normal range of motion.  Neurological: She is alert and oriented to person, place, and time. She has normal reflexes.  Skin: Skin is warm and dry.  Psychiatric: She has a normal mood and affect. Her behavior is normal. Judgment and thought content normal.    Activities of Daily Living In your present state of health, do you have any difficulty performing the following activities: 08/31/2015 04/08/2015  Hearing? N N  Vision? Y N  Difficulty concentrating or making decisions? N N  Walking or climbing stairs? N N  Dressing or bathing? N N  Doing errands, shopping? N N    Fall Risk Assessment Fall Risk  08/31/2015 04/14/2015  Falls in the past year? No No     Depression  Screen PHQ 2/9 Scores 08/31/2015 04/14/2015  PHQ - 2 Score 1 2  PHQ- 9 Score - 10    Cognitive Testing - 6-CIT    Year: 0 points  Month: 0 points  Memorize "Floyde Parkins, [address] [place]"  Time (within 1 hour:) 0 points  Count backwards from 20: 0 points  Name months of year: 0  points  Repeat Address: 0  points   Total Score: 0/28  Interpretation : Normal (0-7) Abnormal (8-28)    Assessment & Plan:     Annual Wellness Visit  Reviewed patient's Family Medical History Reviewed and updated list of patient's medical providers Assessment of cognitive impairment was done Assessed patient's functional ability Established a written schedule for health screening services Health Risk Assessent Completed and Reviewed  Exercise Activities and Dietary recommendations Goals    None      Immunization History  Administered Date(s) Administered  . Influenza-Unspecified 02/24/2013  . Pneumococcal Conjugate-13 08/08/2013  . Pneumococcal  Polysaccharide-23 05/24/2011  . Tdap 12/13/2006    Health Maintenance  Topic Date Due  . Hepatitis C Screening  [birth year]  . ZOSTAVAX  07/04/2004  . INFLUENZA VACCINE  01/13/2016 (Originally 10/26/2015)  . TETANUS/TDAP  12/12/2016  . MAMMOGRAM  07/06/2017  . COLONOSCOPY  08/27/2023  . DEXA SCAN  Completed  . PNA vac Low Risk Adult  Completed      Discussed health benefits of physical activity, and encouraged her to engage in regular exercise appropriate for her age and condition.    Julieanne Manson MD [facility] 08/31/2015 11:06 AM  ------------------------------------------------------------------------------------------------------------

## 2015-12-30 ENCOUNTER — Ambulatory Visit: Payer: PPO | Admitting: Family Medicine

## 2016-02-04 ENCOUNTER — Other Ambulatory Visit: Payer: Self-pay | Admitting: Family Medicine

## 2016-02-04 ENCOUNTER — Other Ambulatory Visit: Payer: Self-pay | Admitting: Cardiovascular Disease

## 2016-02-04 DIAGNOSIS — F329 Major depressive disorder, single episode, unspecified: Secondary | ICD-10-CM

## 2016-02-04 DIAGNOSIS — F32A Depression, unspecified: Secondary | ICD-10-CM

## 2016-02-04 DIAGNOSIS — I10 Essential (primary) hypertension: Secondary | ICD-10-CM

## 2016-02-04 DIAGNOSIS — E039 Hypothyroidism, unspecified: Secondary | ICD-10-CM

## 2016-04-13 ENCOUNTER — Other Ambulatory Visit: Payer: Self-pay | Admitting: Family Medicine

## 2016-04-13 DIAGNOSIS — F329 Major depressive disorder, single episode, unspecified: Secondary | ICD-10-CM

## 2016-04-13 DIAGNOSIS — F32A Depression, unspecified: Secondary | ICD-10-CM

## 2016-04-14 ENCOUNTER — Other Ambulatory Visit: Payer: Self-pay

## 2016-04-14 MED ORDER — BUPROPION HCL ER (XL) 150 MG PO TB24: 150.0000 mg | ORAL_TABLET | Freq: Every day | ORAL | 12 refills | Status: DC

## 2016-05-10 IMAGING — CT CT ANGIO CHEST
1 of 2 series · 18 of 30 positions shown · IV contrast (APPLIED)
Comparison: Radiograph dated 04/07/2015

CLINICAL DATA: 69-year-old female with dyspnea and chest pain

EXAM:
CT ANGIOGRAPHY CHEST WITH CONTRAST
TECHNIQUE: Multidetector CT imaging of the chest was performed using the
standard protocol during bolus administration of intravenous
contrast. Multiplanar CT image reconstructions and MIPs were
obtained to evaluate the vascular anatomy.
CONTRAST:  100mL OMNIPAQUE IOHEXOL 350 MG/ML SOLN

[Series 5: pe 1.0 thins · axial · 0.74mm/px · z∈[-298,-66]mm · 18 of 261 slices shown]
[im 15/261  lung]
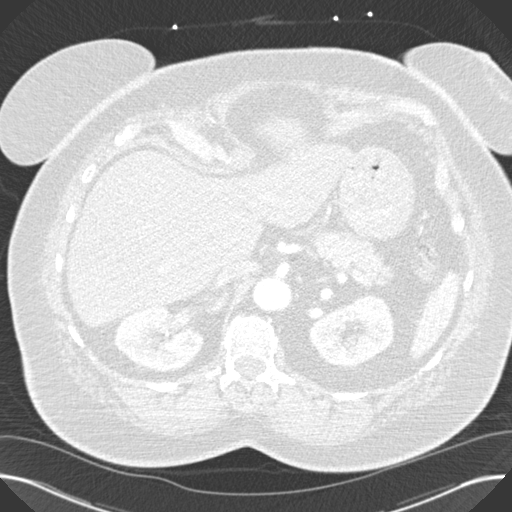
[im 29/261  mediastinal]
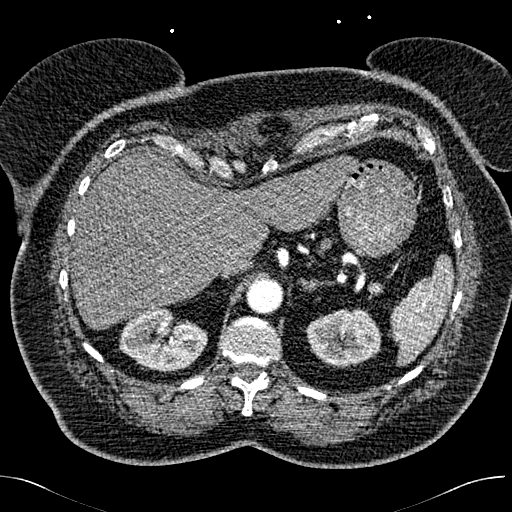
[im 44/261  lung]
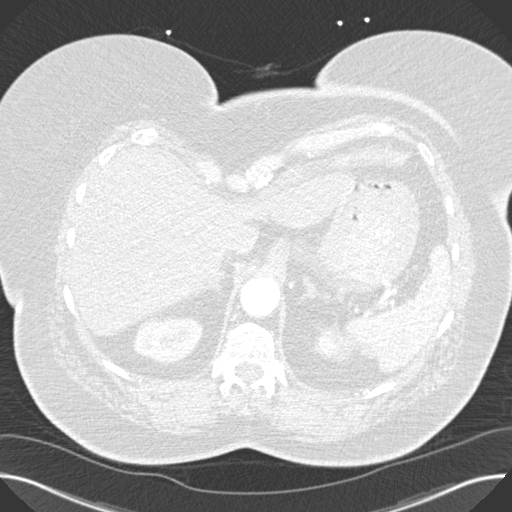
[im 58/261  mediastinal]
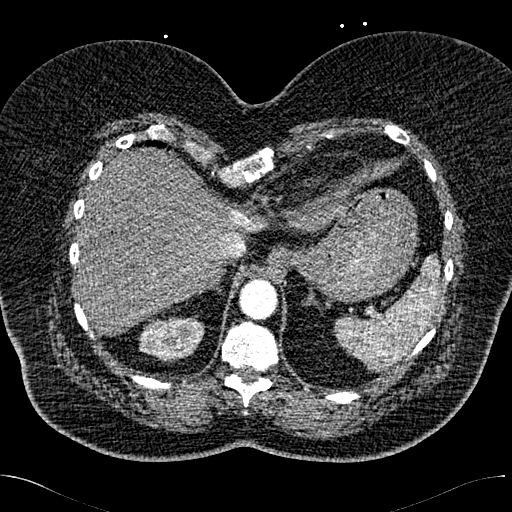
[im 73/261  lung]
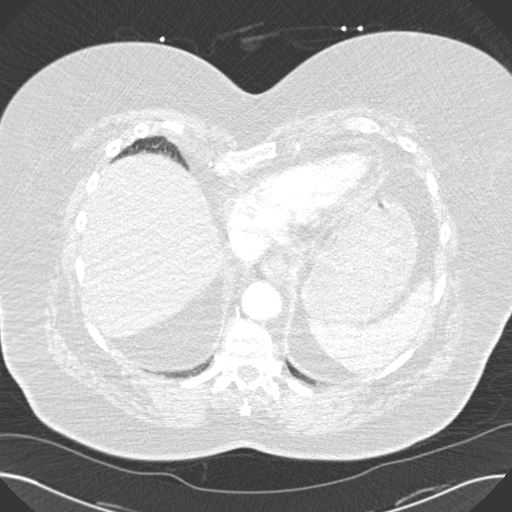
[im 87/261  mediastinal]
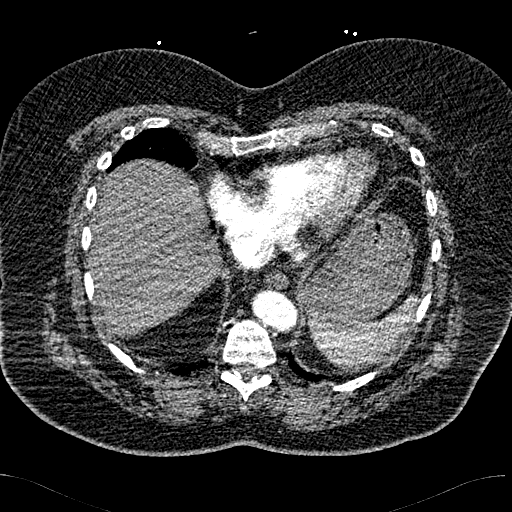
[im 102/261  lung]
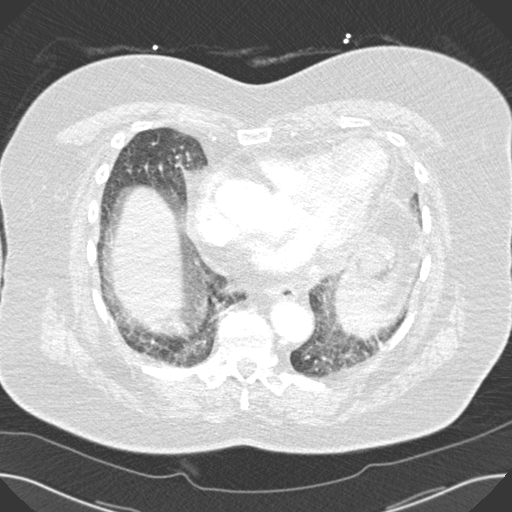
[im 116/261  mediastinal]
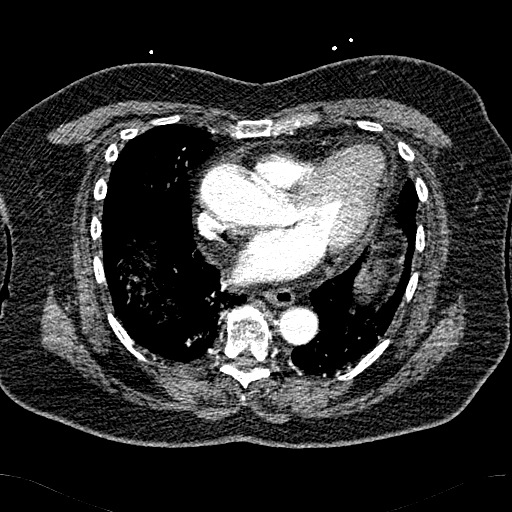
[im 121/261  lung]
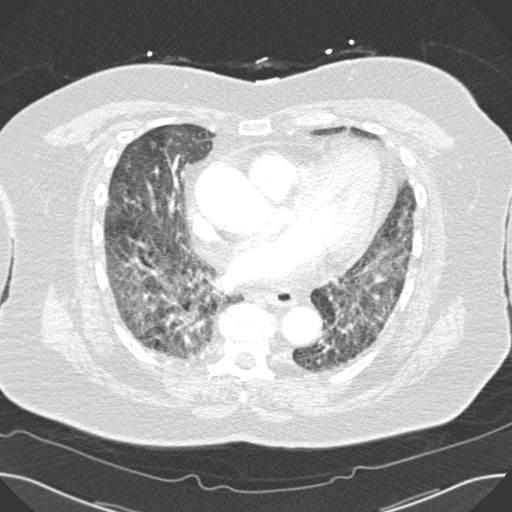
[im 131/261  mediastinal]
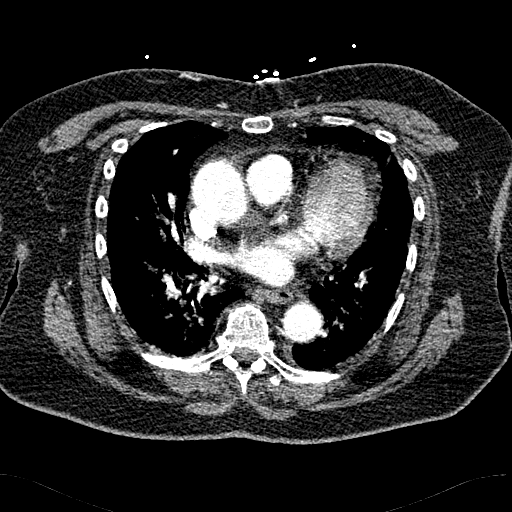
[im 145/261  lung]
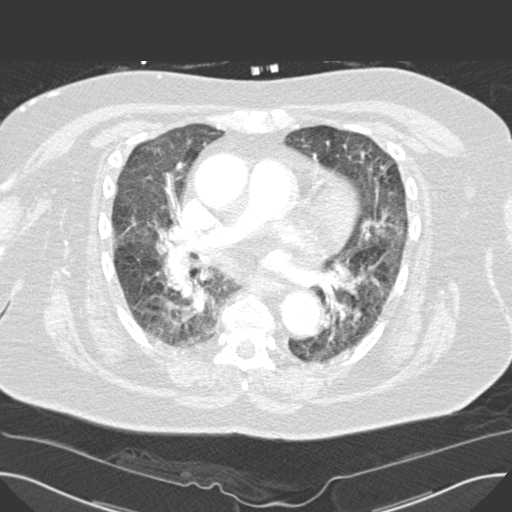
[im 159/261  mediastinal]
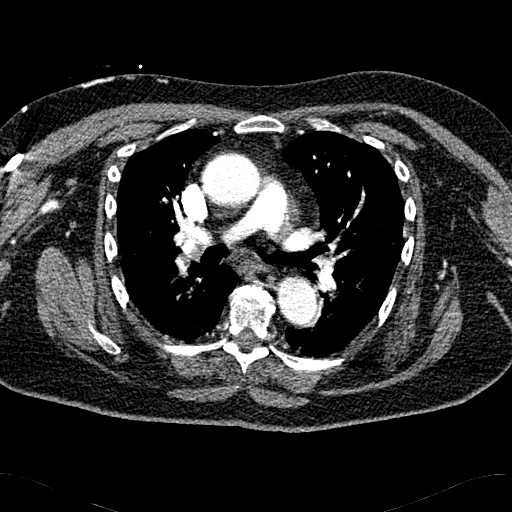
[im 174/261  lung]
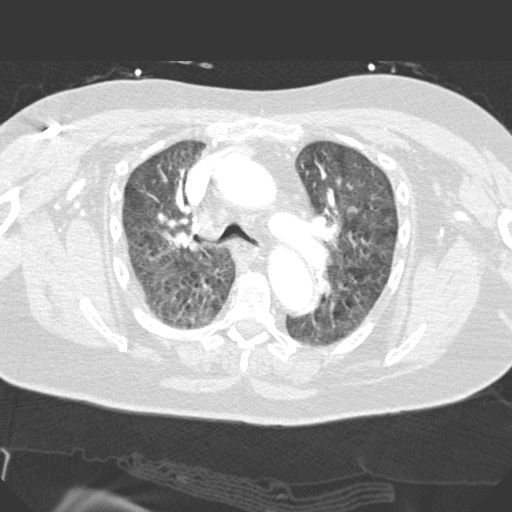
[im 188/261  mediastinal]
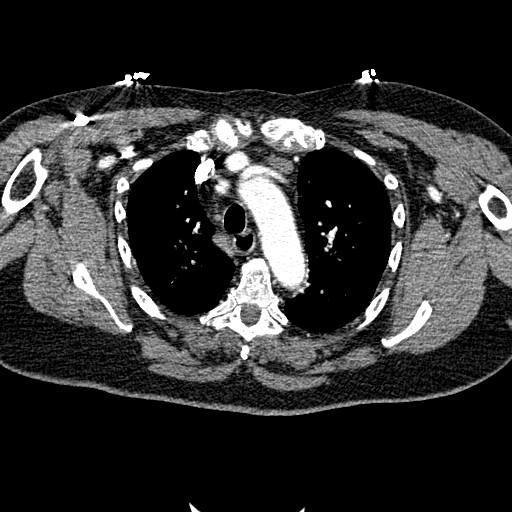
[im 203/261  lung]
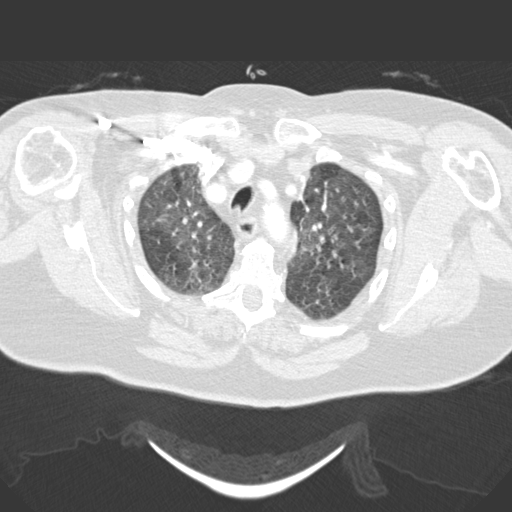
[im 217/261  mediastinal]
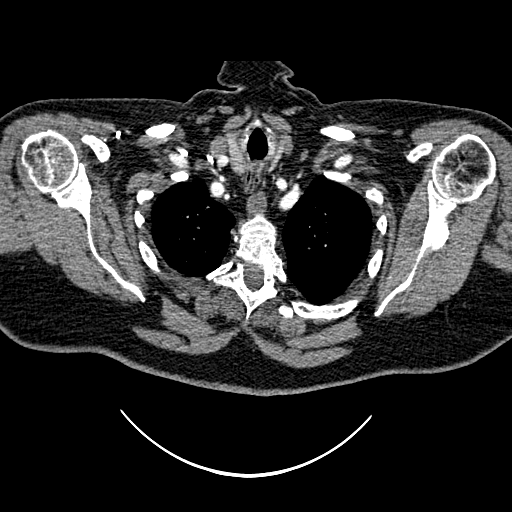
[im 232/261  lung]
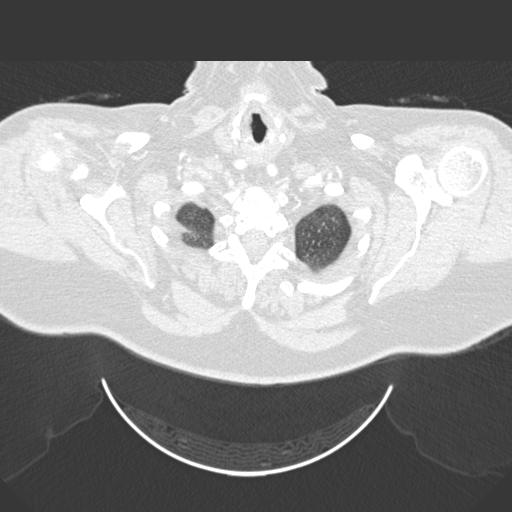
[im 246/261  mediastinal]
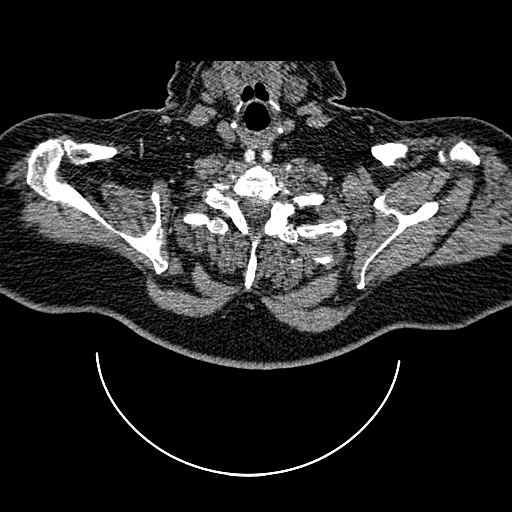

[18 of 30 positions shown; findings below may reference images not displayed]

FINDINGS: Evaluation of this exam is limited due to respiratory motion
artifact. Evaluation is also limited due to streak artifact caused
by patient's arms.

There is mild emphysematous changes of the lungs. There is patchy
areas of ground-glass opacities with mosaic appearance of the lungs
compatible with small airways versus small vessel disease. Pneumonia
is less likely but not excluded. Clinical correlation is
recommended. There is no focal consolidation, pleural effusion, or
pneumothorax. The central airways are patent.

The thoracic aorta appears unremarkable. Evaluation of the pulmonary
arteries is limited due to suboptimal visualization of the
peripheral branches. No central pulmonary artery embolus identified.
Top-normal cardiac size. No pericardial effusion. The esophagus is
grossly unremarkable. No thyroid nodule.

There is no axillary adenopathy. The chest wall soft tissues appear
unremarkable. There is degenerative changes of the spine. No acute
fracture.

The visualized upper abdomen appears unremarkable.

Review of the MIP images confirms the above findings.
IMPRESSION: No definite CT evidence of central pulmonary embolus.

Diffuse airspace haziness with areas of air trapping likely within
small airways versus small vessel disease. Clinical correlation is
recommended.

## 2016-05-11 IMAGING — CR DG CHEST 1V
1 series · 1 of 1 positions shown · non-contrast
Comparison: 04/07/2015

CLINICAL DATA: Hypoxia

EXAM:
CHEST 1 VIEW

[ap]
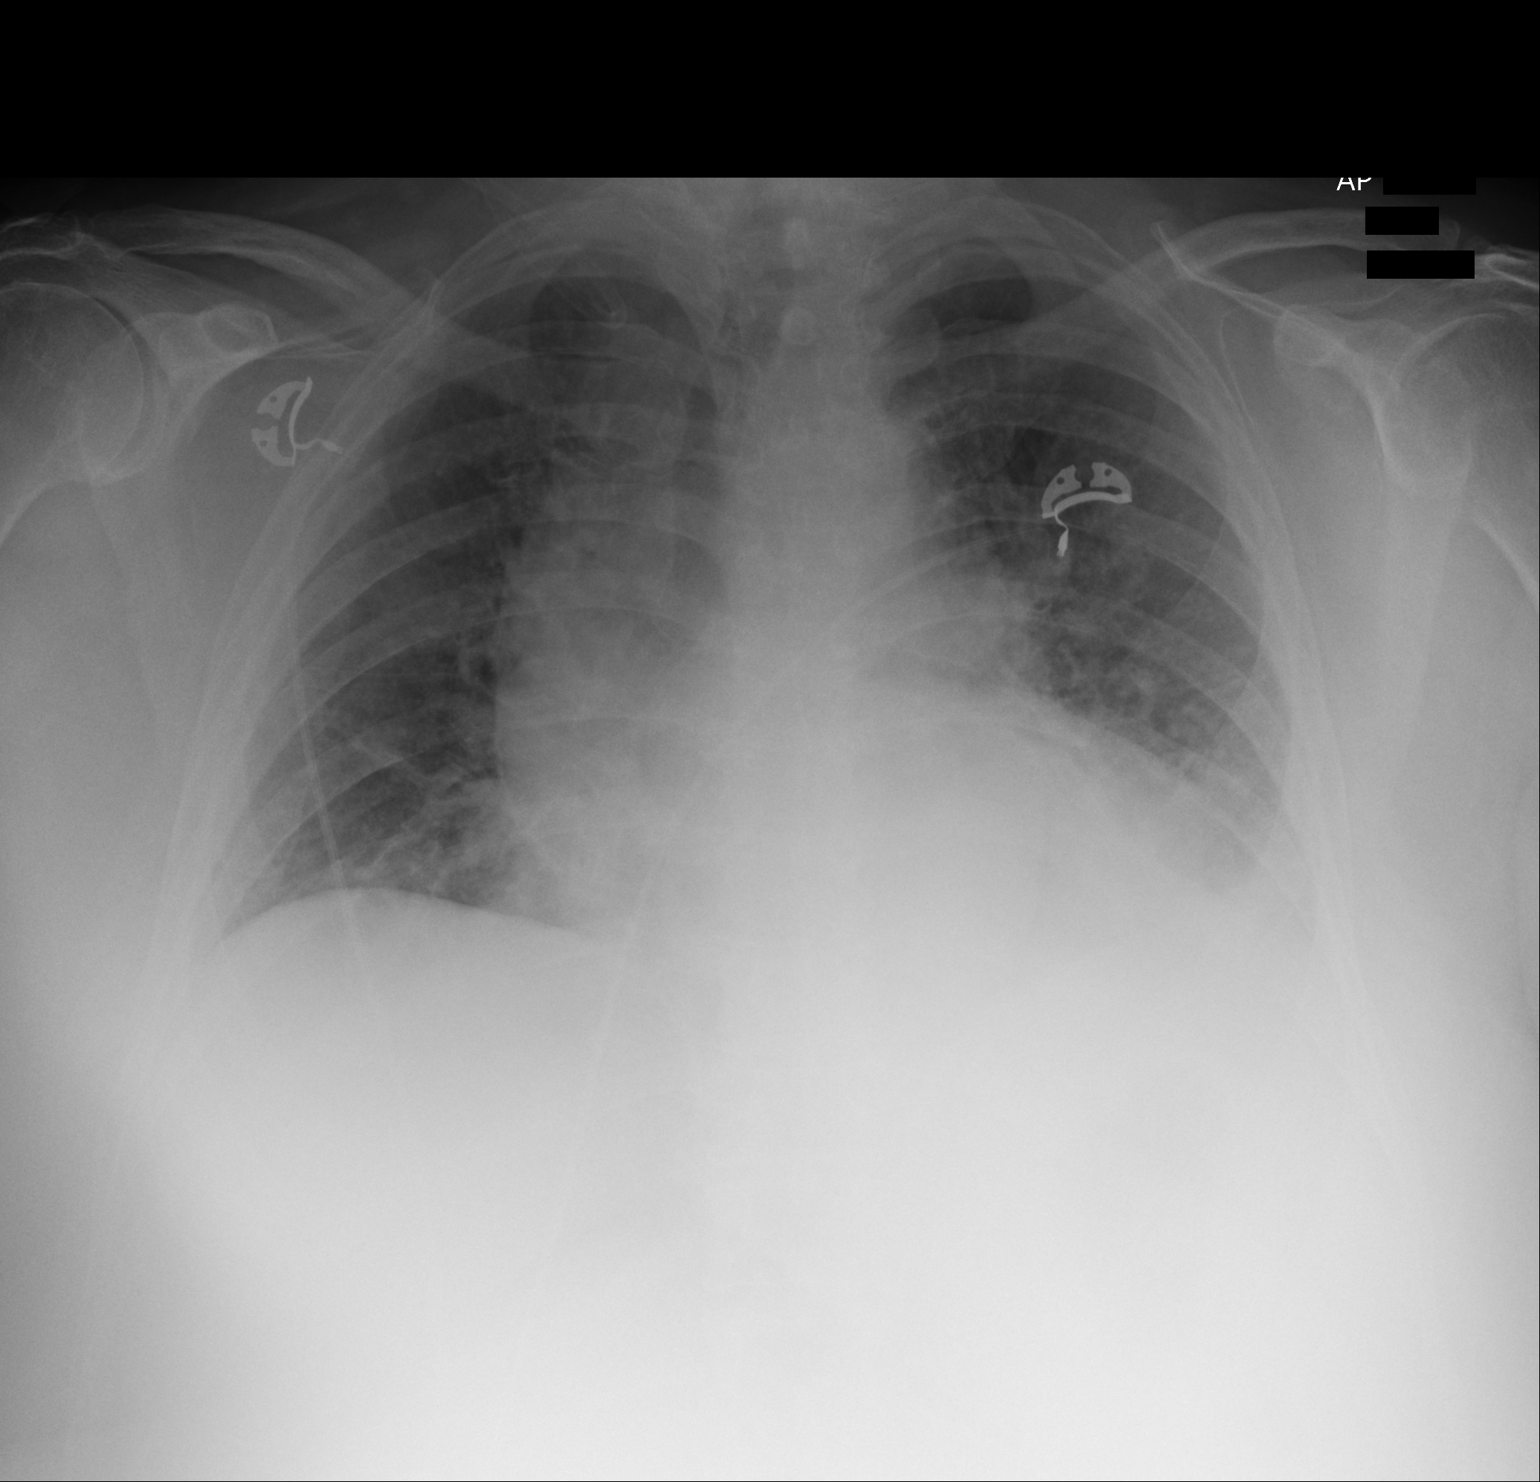

[1 of 1 positions shown; findings below may reference images not displayed]

FINDINGS: Cardiomegaly, vascular pedicle widening, and aortic tortuosity as
seen on recent chest CT. There are low lung volumes with
interstitial opacities at the bases. Blunting of the lateral left
costophrenic sulcus is likely fat pad based on CT. No definitive
effusion. No pneumothorax.
IMPRESSION: 1. Cardiomegaly and pulmonary venous congestion.
2. Low volumes with basilar opacity representing atelectasis on
prior CT

## 2016-05-25 ENCOUNTER — Ambulatory Visit: Payer: PPO | Admitting: Cardiovascular Disease

## 2016-05-30 ENCOUNTER — Ambulatory Visit (INDEPENDENT_AMBULATORY_CARE_PROVIDER_SITE_OTHER): Payer: PPO | Admitting: Cardiovascular Disease

## 2016-05-30 ENCOUNTER — Encounter: Payer: Self-pay | Admitting: Cardiovascular Disease

## 2016-05-30 VITALS — BP 120/70 | HR 69 | Ht 63.0 in | Wt 209.5 lb

## 2016-05-30 DIAGNOSIS — E785 Hyperlipidemia, unspecified: Secondary | ICD-10-CM

## 2016-05-30 DIAGNOSIS — I10 Essential (primary) hypertension: Secondary | ICD-10-CM | POA: Diagnosis not present

## 2016-05-30 DIAGNOSIS — R079 Chest pain, unspecified: Secondary | ICD-10-CM

## 2016-05-30 DIAGNOSIS — R000 Tachycardia, unspecified: Secondary | ICD-10-CM | POA: Diagnosis not present

## 2016-05-30 DIAGNOSIS — I5032 Chronic diastolic (congestive) heart failure: Secondary | ICD-10-CM | POA: Diagnosis not present

## 2016-05-30 DIAGNOSIS — R0602 Shortness of breath: Secondary | ICD-10-CM

## 2016-05-30 MED ORDER — POTASSIUM CHLORIDE ER 10 MEQ PO TBCR: 10.0000 meq | EXTENDED_RELEASE_TABLET | Freq: Every day | ORAL | 3 refills | Status: DC | PRN

## 2016-05-30 MED ORDER — FUROSEMIDE 20 MG PO TABS: 20.0000 mg | ORAL_TABLET | Freq: Every day | ORAL | 3 refills | Status: DC | PRN

## 2016-05-30 NOTE — Progress Notes (Signed)
Cardiology Office Note  Date:  05/30/2016   ID:  PANZY BUBECK, DOB 02-22-1946, MRN [id number]  PCP:  Megan Mans, MD   Chief Complaint  Patient presents with  . other    OD 65mo f/u. Pt c/o sob, dizziness, indigestion. Reviewed meds with pt verbally.    HPI:  Latoya Cordova is a 71 y.o. female with history of HTN, obesity, and anxiety with previous admission to the hospital 1/12 to 04/11/15 for acute respiratory distress in the setting of likely viral pneumonitis or pneumonia, Treated with levaquin She presents today for follow-up of her diastolic CHF, tachycardia Remote history of chest pain in 2004  In general she reports that she feels well No regular exercise program,  Reports having some fluid retention in her hands, legs, abdomen Felt better after she took diuretic on prior hospital visit, feel she has fluid again Little bit of ankle swelling, not much  shortness of breath on exertion especially if she walks too quickly Denies PND orthopnea Weight is up approximately 10 pounds from prior clinic visit  Orthostatics performed today showing minimal drop in blood pressure from supine position to standing 132 down to 118, heart rate 67 up to 73  EKG on today's visit shows normal sinus rhythm with rate 69 bpm, left axis deviation  Other past medical history reviewed Previous Echo showed EF 60-65%, RWMA could be be excluded, GR1DD, ascending aorta mildly dilated at 37 mm, normal right sided pressure.  CT scan of the chest There is no significant coronary artery calcifications, no significant carotid, aortic atherosclerosis  CTA was negative for PE, though there was motion artifact. CXR showed increased interstital prominence suggesting possibly a viral etiology.    PMH:   has a past medical history of Anxiety; Diastolic dysfunction; Hypertension; and Morbid obesity (HCC).  PSH:    Past Surgical History:  Procedure Laterality Date  . ABDOMINAL HYSTERECTOMY   08/18/2004  . APPENDECTOMY      Current Outpatient Prescriptions  Medication Sig Dispense Refill  . buPROPion (WELLBUTRIN XL) 150 MG 24 hr tablet Take 1 tablet (150 mg total) by mouth daily. 30 tablet 12  . butalbital-aspirin-caffeine (FIORINAL) 50-325-40 MG capsule Take 1 capsule by mouth 2 (two) times daily as needed for headache. 55 capsule 2  . esomeprazole (NEXIUM) 40 MG capsule Take 1 capsule (40 mg total) by mouth daily. 30 capsule 12  . hydrochlorothiazide (HYDRODIURIL) 25 MG tablet take 1 tablet by mouth once daily 90 tablet 2  . HYDROcodone-acetaminophen (NORCO/VICODIN) 5-325 MG tablet Take 1-2 tablets by mouth every 6 (six) hours as needed for moderate pain. 100 tablet 0  . levothyroxine (SYNTHROID, LEVOTHROID) 25 MCG tablet take 1 tablet by mouth once daily 90 tablet 2  . LORazepam (ATIVAN) 1 MG tablet take 1 tablet by mouth at bedtime if needed 30 tablet 5  . metoprolol tartrate (LOPRESSOR) 25 MG tablet Take 0.5 tablets (12.5 mg total) by mouth 2 (two) times daily. 30 tablet 3  . PARoxetine (PAXIL) 40 MG tablet take 1 tablet by mouth once daily 90 tablet 2  . simethicone (MYLICON) 80 MG chewable tablet Chew 80 mg by mouth daily as needed for flatulence.    . furosemide (LASIX) 20 MG tablet Take 1 tablet (20 mg total) by mouth daily as needed. 30 tablet 3  . potassium chloride (K-DUR) 10 MEQ tablet Take 1 tablet (10 mEq total) by mouth daily as needed. 30 tablet 3   No current facility-administered medications for  this visit.      Allergies:   Sulfa antibiotics and Lodine  [etodolac]   Social History:  The patient  reports that she has never smoked. She has never used smokeless tobacco. She reports that she does not drink alcohol or use drugs.   Family History:   family history includes Breast cancer in her cousin and maternal aunt; Congestive Heart Failure in her mother; Heart attack in her paternal grandfather; Hypertension in her mother; Ulcers in her father.    Review of  Systems: Review of Systems  Constitutional: Negative.   Respiratory: Positive for shortness of breath.   Cardiovascular: Positive for leg swelling.  Gastrointestinal: Negative.   Musculoskeletal: Negative.   Neurological: Negative.   Psychiatric/Behavioral: Negative.   All other systems reviewed and are negative.    PHYSICAL EXAM: VS:  BP 120/70 (BP Location: Left Arm, Patient Position: Sitting, Cuff Size: Normal)   Pulse 69   Ht 5\' 3"  (1.6 m)   Wt 209 lb 8 oz (95 kg)   BMI 37.11 kg/m  , BMI Body mass index is 37.11 kg/m. GEN: Well nourished, well developed, in no acute distress, obese  HEENT: normal  Neck: no JVD, carotid bruits, or masses Cardiac: RRR; no murmurs, rubs, or gallops,no edema  Respiratory:  clear to auscultation bilaterally, normal work of breathing GI: soft, nontender, nondistended, + BS MS: no deformity or atrophy  Skin: warm and dry, no rash Neuro:  Strength and sensation are intact Psych: euthymic mood, full affect    Recent Labs: No results found for requested labs within last 8760 hours.    Lipid Panel Lab Results  Component Value Date   CHOL 196 01/19/2015   HDL 44 01/19/2015   LDLCALC 106 (H) 01/19/2015   TRIG 229 (H) 01/19/2015      Wt Readings from Last 3 Encounters:  05/30/16 209 lb 8 oz (95 kg)  08/31/15 206 lb (93.4 kg)  06/17/15 198 lb (89.8 kg)       ASSESSMENT AND PLAN:  Essential (primary) hypertension - Plan: EKG 12-Lead Blood pressure is well controlled on today's visit. No changes made to the medications.  Hyperlipidemia, unspecified hyperlipidemia type - Plan: EKG 12-Lead No recent cholesterol available recent 10 pound weight gain no significant calcifications on previous ct scan   Essential hypertension - Plan: EKG 12-Lead Blood pressure is well controlled on today's visit. No changes made to the medications.  Chest pain, unspecified type - Plan: EKG 12-Lead No chest pain sx, no anginal sx  SOB (shortness of  breath) - Plan: EKG 12-Lead  Shortness of breath likely multifactorial including morbid obesity/10 pound weight gain in the past year, unable to exclude fluid retention and diastolic CHF Plan as below Recommended weight loss, exercise program  Sinus tachycardia - Plan: EKG 12-Lead Rate relatively well-controlled on today's visit, we'll continue low-dose metoprolol  Chronic diastolic CHF (congestive heart failure) (HCC) Given her weight gain, and bloating, shortness of breath, we have recommended very gentle Lasix with potassium use once per week if not twice per week. If renal function is stable when she sees Dr. Sullivan Lone in follow-up, potentially could increase up to 3 times per week She has very high fluid intake which she enjoys Discussed salt and fluid intake with her, she has no plans on moderating her fluid intake   Total encounter time more than 25 minutes  Greater than 50% was spent in counseling and coordination of care with the patient   Disposition:  F/U  6 months   Orders Placed This Encounter  Procedures  . EKG 12-Lead     Signed, Dossie Arbourim Devun Anna, M.D., Ph.D. 05/30/2016  [facility], [place] [phone]

## 2016-05-30 NOTE — Patient Instructions (Addendum)
Medication Instructions:   Please start lasix with potassium sparingly Once to twice a week to start  Labwork:  No new labs needed  Testing/Procedures:  No further testing at this time   I recommend watching educational videos on topics of interest to you at:       [email or web address]  Enter code: HEARTCARE    Follow-Up: It was a pleasure seeing you in the office today. Please call us if you have new issues that need to be addressed before your next appt.  [phone]  Your physician wants you to follow-up in: 2 month.    If you need a refill on your cardiac medications before your next appointment, please call your pharmacy.

## 2016-07-02 ENCOUNTER — Other Ambulatory Visit: Payer: Self-pay | Admitting: Cardiovascular Disease

## 2016-07-29 NOTE — Progress Notes (Deleted)
Cardiology Office Note  Date:  07/29/2016   ID:  Latoya Cordova, DOB 06-Oct-1945, MRN [id number]  PCP:  Maple Hudson., MD   No chief complaint on file.   HPI:  Latoya Cordova is a 71 y.o. female with history of  Remote history of chest pain in 2004 No CAD on CT scan, 03/2015 HTN,  obesity, anxiety with previous admission to the hospital 1/12 to 04/11/15 for acute respiratory distress in the setting of likely viral pneumonitis or pneumonia,  Treated with levaquin luid retention in her hands, legs, abdomen She presents today for follow-up of her diastolic CHF, tachycardia   In general she reports that she feels well No regular exercise program,  Reports having some fluid retention in her hands, legs, abdomen Felt better after she took diuretic on prior hospital visit, feel she has fluid again Little bit of ankle swelling, not much  shortness of breath on exertion especially if she walks too quickly Denies PND orthopnea Weight is up approximately 10 pounds from prior clinic visit  Orthostatics performed today showing minimal drop in blood pressure from supine position to standing 132 down to 118, heart rate 67 up to 73  EKG on today's visit shows normal sinus rhythm with rate 69 bpm, left axis deviation  Other past medical history reviewed Previous Echo showed EF 60-65%, RWMA could be be excluded, GR1DD, ascending aorta mildly dilated at 37 mm, normal right sided pressure.  CT scan of the chest There is no significant coronary artery calcifications, no significant carotid, aortic atherosclerosis  CTA was negative for PE, though there was motion artifact. CXR showed increased interstital prominence suggesting possibly a viral etiology.    PMH:   has a past medical history of Anxiety; Diastolic dysfunction; Hypertension; and Morbid obesity (HCC).  PSH:    Past Surgical History:  Procedure Laterality Date  . ABDOMINAL HYSTERECTOMY  08/18/2004  . APPENDECTOMY       Current Outpatient Prescriptions  Medication Sig Dispense Refill  . buPROPion (WELLBUTRIN XL) 150 MG 24 hr tablet Take 1 tablet (150 mg total) by mouth daily. 30 tablet 12  . butalbital-aspirin-caffeine (FIORINAL) 50-325-40 MG capsule Take 1 capsule by mouth 2 (two) times daily as needed for headache. 55 capsule 2  . esomeprazole (NEXIUM) 40 MG capsule Take 1 capsule (40 mg total) by mouth daily. 30 capsule 12  . furosemide (LASIX) 20 MG tablet Take 1 tablet (20 mg total) by mouth daily as needed. 30 tablet 3  . hydrochlorothiazide (HYDRODIURIL) 25 MG tablet take 1 tablet by mouth once daily 90 tablet 2  . HYDROcodone-acetaminophen (NORCO/VICODIN) 5-325 MG tablet Take 1-2 tablets by mouth every 6 (six) hours as needed for moderate pain. 100 tablet 0  . levothyroxine (SYNTHROID, LEVOTHROID) 25 MCG tablet take 1 tablet by mouth once daily 90 tablet 2  . LORazepam (ATIVAN) 1 MG tablet take 1 tablet by mouth at bedtime if needed 30 tablet 5  . metoprolol tartrate (LOPRESSOR) 25 MG tablet TAKE 1/2 TABLET BY MOUTH TWICE A DAY 30 tablet 3  . PARoxetine (PAXIL) 40 MG tablet take 1 tablet by mouth once daily 90 tablet 2  . potassium chloride (K-DUR) 10 MEQ tablet Take 1 tablet (10 mEq total) by mouth daily as needed. 30 tablet 3  . simethicone (MYLICON) 80 MG chewable tablet Chew 80 mg by mouth daily as needed for flatulence.     No current facility-administered medications for this visit.      Allergies:  Sulfa antibiotics and Lodine  [etodolac]   Social History:  The patient  reports that she has never smoked. She has never used smokeless tobacco. She reports that she does not drink alcohol or use drugs.   Family History:   family history includes Breast cancer in her cousin and maternal aunt; Congestive Heart Failure in her mother; Heart attack in her paternal grandfather; Hypertension in her mother; Ulcers in her father.    Review of Systems: Review of Systems  Constitutional: Negative.    Respiratory: Positive for shortness of breath.   Cardiovascular: Positive for leg swelling.  Gastrointestinal: Negative.   Musculoskeletal: Negative.   Neurological: Negative.   Psychiatric/Behavioral: Negative.   All other systems reviewed and are negative.    PHYSICAL EXAM: VS:  There were no vitals taken for this visit. , BMI There is no height or weight on file to calculate BMI. GEN: Well nourished, well developed, in no acute distress, obese  HEENT: normal  Neck: no JVD, carotid bruits, or masses Cardiac: RRR; no murmurs, rubs, or gallops,no edema  Respiratory:  clear to auscultation bilaterally, normal work of breathing GI: soft, nontender, nondistended, + BS MS: no deformity or atrophy  Skin: warm and dry, no rash Neuro:  Strength and sensation are intact Psych: euthymic mood, full affect    Recent Labs: No results found for requested labs within last 8760 hours.    Lipid Panel Lab Results  Component Value Date   CHOL 196 01/19/2015   HDL 44 01/19/2015   LDLCALC 106 (H) 01/19/2015   TRIG 229 (H) 01/19/2015      Wt Readings from Last 3 Encounters:  05/30/16 209 lb 8 oz (95 kg)  08/31/15 206 lb (93.4 kg)  06/17/15 198 lb (89.8 kg)       ASSESSMENT AND PLAN:  Essential (primary) hypertension - Plan: EKG 12-Lead Blood pressure is well controlled on today's visit. No changes made to the medications.  Hyperlipidemia, unspecified hyperlipidemia type - Plan: EKG 12-Lead No recent cholesterol available recent 10 pound weight gain no significant calcifications on previous ct scan   Essential hypertension - Plan: EKG 12-Lead Blood pressure is well controlled on today's visit. No changes made to the medications.  Chest pain, unspecified type - Plan: EKG 12-Lead No chest pain sx, no anginal sx  SOB (shortness of breath) - Plan: EKG 12-Lead  Shortness of breath likely multifactorial including morbid obesity/10 pound weight gain in the past year, unable to  exclude fluid retention and diastolic CHF Plan as below Recommended weight loss, exercise program  Sinus tachycardia - Plan: EKG 12-Lead Rate relatively well-controlled on today's visit, we'll continue low-dose metoprolol  Chronic diastolic CHF (congestive heart failure) (HCC) Given her weight gain, and bloating, shortness of breath, we have recommended very gentle Lasix with potassium use once per week if not twice per week. If renal function is stable when she sees Dr. Sullivan LoneGilbert in follow-up, potentially could increase up to 3 times per week She has very high fluid intake which she enjoys Discussed salt and fluid intake with her, she has no plans on moderating her fluid intake   Total encounter time more than 25 minutes  Greater than 50% was spent in counseling and coordination of care with the patient   Disposition:   F/U  6 months   No orders of the defined types were placed in this encounter.    Signed, Dossie Arbourim Gollan, M.D., Ph.D. 07/29/2016  [facility], [place] [phone]

## 2016-07-31 ENCOUNTER — Ambulatory Visit: Payer: PPO | Admitting: Cardiovascular Disease

## 2016-07-31 ENCOUNTER — Encounter: Payer: Self-pay | Admitting: Cardiovascular Disease

## 2016-08-06 ENCOUNTER — Other Ambulatory Visit: Payer: Self-pay | Admitting: Family Medicine

## 2016-09-19 ENCOUNTER — Other Ambulatory Visit: Payer: Self-pay | Admitting: Family Medicine

## 2016-11-13 ENCOUNTER — Telehealth: Payer: Self-pay | Admitting: Family Medicine

## 2016-11-13 NOTE — Telephone Encounter (Signed)
Left message regarding need to schedule awv w [id number]

## 2016-11-29 ENCOUNTER — Telehealth: Payer: Self-pay | Admitting: Family Medicine

## 2016-12-13 ENCOUNTER — Other Ambulatory Visit: Payer: Self-pay | Admitting: Family Medicine

## 2016-12-13 DIAGNOSIS — F329 Major depressive disorder, single episode, unspecified: Secondary | ICD-10-CM

## 2016-12-13 DIAGNOSIS — I10 Essential (primary) hypertension: Secondary | ICD-10-CM

## 2016-12-13 DIAGNOSIS — E039 Hypothyroidism, unspecified: Secondary | ICD-10-CM

## 2016-12-13 DIAGNOSIS — F32A Depression, unspecified: Secondary | ICD-10-CM

## 2017-01-11 ENCOUNTER — Other Ambulatory Visit: Payer: Self-pay | Admitting: Family Medicine

## 2017-01-11 DIAGNOSIS — F32A Depression, unspecified: Secondary | ICD-10-CM

## 2017-01-11 DIAGNOSIS — F329 Major depressive disorder, single episode, unspecified: Secondary | ICD-10-CM

## 2017-01-11 DIAGNOSIS — I10 Essential (primary) hypertension: Secondary | ICD-10-CM

## 2017-01-11 DIAGNOSIS — E039 Hypothyroidism, unspecified: Secondary | ICD-10-CM

## 2017-01-12 ENCOUNTER — Telehealth: Payer: Self-pay | Admitting: Family Medicine

## 2017-01-12 DIAGNOSIS — F3289 Other specified depressive episodes: Secondary | ICD-10-CM

## 2017-01-12 DIAGNOSIS — I10 Essential (primary) hypertension: Secondary | ICD-10-CM

## 2017-01-12 DIAGNOSIS — E038 Other specified hypothyroidism: Secondary | ICD-10-CM

## 2017-01-12 MED ORDER — LEVOTHYROXINE SODIUM 25 MCG PO TABS: 25.0000 ug | ORAL_TABLET | Freq: Every day | ORAL | 0 refills | Status: DC

## 2017-01-12 MED ORDER — HYDROCHLOROTHIAZIDE 25 MG PO TABS: 25.0000 mg | ORAL_TABLET | Freq: Every day | ORAL | 0 refills | Status: DC

## 2017-01-12 MED ORDER — LORAZEPAM 1 MG PO TABS: ORAL_TABLET | ORAL | 0 refills | Status: DC

## 2017-01-12 MED ORDER — PAROXETINE HCL 40 MG PO TABS: 40.0000 mg | ORAL_TABLET | Freq: Every day | ORAL | 0 refills | Status: DC

## 2017-01-12 NOTE — Telephone Encounter (Signed)
Pt contacted office for refill request on the following medications:  LORazepam (ATIVAN) 1 MG tablet  levothyroxine (SYNTHROID, LEVOTHROID) 25 MCG tablet  PARoxetine (PAXIL) 40 MG tablet   hydrochlorothiazide (HYDRODIURIL) 25 MG tablet  [facility] [address]  CB#[phone]/MW

## 2017-01-12 NOTE — Telephone Encounter (Signed)
Please phone in lorazepam 1mg  Take 1 tab PO q hs prn #30 NR to [facility] [address] [phone]

## 2017-01-12 NOTE — Telephone Encounter (Signed)
Pres[facility]  Thanks,  -Joseline

## 2017-01-12 NOTE — Telephone Encounter (Signed)
Patient is out of her medication. Can you please review? She made an appointment for next week for follow up. Thank Neta Mendsyou-Kambrea Carrasco V Raelea Gosse, RMA

## 2017-01-17 ENCOUNTER — Encounter: Payer: Self-pay | Admitting: Family Medicine

## 2017-01-17 ENCOUNTER — Ambulatory Visit (INDEPENDENT_AMBULATORY_CARE_PROVIDER_SITE_OTHER): Payer: PPO | Admitting: Family Medicine

## 2017-01-17 VITALS — BP 124/68 | HR 90 | Temp 97.8°F | Resp 16 | Wt 207.0 lb

## 2017-01-17 DIAGNOSIS — I10 Essential (primary) hypertension: Secondary | ICD-10-CM | POA: Diagnosis not present

## 2017-01-17 DIAGNOSIS — F411 Generalized anxiety disorder: Secondary | ICD-10-CM | POA: Diagnosis not present

## 2017-01-17 DIAGNOSIS — R51 Headache: Secondary | ICD-10-CM | POA: Diagnosis not present

## 2017-01-17 DIAGNOSIS — R519 Headache, unspecified: Secondary | ICD-10-CM

## 2017-01-17 DIAGNOSIS — K219 Gastro-esophageal reflux disease without esophagitis: Secondary | ICD-10-CM | POA: Diagnosis not present

## 2017-01-17 DIAGNOSIS — M1990 Unspecified osteoarthritis, unspecified site: Secondary | ICD-10-CM | POA: Diagnosis not present

## 2017-01-17 DIAGNOSIS — E038 Other specified hypothyroidism: Secondary | ICD-10-CM | POA: Diagnosis not present

## 2017-01-17 DIAGNOSIS — F3289 Other specified depressive episodes: Secondary | ICD-10-CM

## 2017-01-17 DIAGNOSIS — E782 Mixed hyperlipidemia: Secondary | ICD-10-CM

## 2017-01-17 DIAGNOSIS — Z23 Encounter for immunization: Secondary | ICD-10-CM

## 2017-01-17 MED ORDER — ESOMEPRAZOLE MAGNESIUM 40 MG PO CPDR: 40.0000 mg | DELAYED_RELEASE_CAPSULE | Freq: Every day | ORAL | 3 refills | Status: DC

## 2017-01-17 MED ORDER — LORAZEPAM 1 MG PO TABS: ORAL_TABLET | ORAL | 1 refills | Status: DC

## 2017-01-17 MED ORDER — LEVOTHYROXINE SODIUM 25 MCG PO TABS: 25.0000 ug | ORAL_TABLET | Freq: Every day | ORAL | 3 refills | Status: DC

## 2017-01-17 MED ORDER — HYDROCHLOROTHIAZIDE 25 MG PO TABS: 25.0000 mg | ORAL_TABLET | Freq: Every day | ORAL | 3 refills | Status: DC

## 2017-01-17 MED ORDER — BUTALBITAL-ASPIRIN-CAFFEINE 50-325-40 MG PO CAPS
1.0000 | ORAL_CAPSULE | Freq: Two times a day (BID) | ORAL | 2 refills | Status: DC | PRN
Start: 2017-01-17 — End: 2017-09-13

## 2017-01-17 MED ORDER — HYDROCODONE-ACETAMINOPHEN 5-325 MG PO TABS: 1.0000 | ORAL_TABLET | Freq: Four times a day (QID) | ORAL | 0 refills | Status: DC | PRN

## 2017-01-17 MED ORDER — BUPROPION HCL ER (XL) 150 MG PO TB24: 150.0000 mg | ORAL_TABLET | Freq: Every day | ORAL | 3 refills | Status: DC

## 2017-01-17 NOTE — Progress Notes (Signed)
Patient: Latoya Cordova Female    DOB: 1945/04/24   71 y.o.   MRN: [id number] Visit Date: 01/17/2017  Today's Provider: Megan Mans, MD   Chief Complaint  Patient presents with  . Hypertension  . Depression   Subjective:    HPI Pt is here for a follow up she needs refills on her medications. She reports that she recently ran out of a few medications and felt like she was going to jump out of her skin. She called here and got a refill until she could get in today. She reports that she has been feeling depressed. She would like to see a therapist. She has also been having stomach pain and gurgling. She has not been on her Nexium for about 5 years now. She reports that it hurts on both sides of her stomach and she feels like her esophagus is tight when food goes down.      Allergies  Allergen Reactions  . Sulfa Antibiotics     Per Mother  . Lodine  [Etodolac] Rash    Abdominal rash.     Current Outpatient Prescriptions:  .  buPROPion (WELLBUTRIN XL) 150 MG 24 hr tablet, Take 1 tablet (150 mg total) by mouth daily., Disp: 30 tablet, Rfl: 12 .  butalbital-aspirin-caffeine (FIORINAL) 50-325-40 MG capsule, Take 1 capsule by mouth 2 (two) times daily as needed for headache., Disp: 55 capsule, Rfl: 2 .  hydrochlorothiazide (HYDRODIURIL) 25 MG tablet, Take 1 tablet (25 mg total) by mouth daily., Disp: 30 tablet, Rfl: 0 .  HYDROcodone-acetaminophen (NORCO/VICODIN) 5-325 MG tablet, Take 1-2 tablets by mouth every 6 (six) hours as needed for moderate pain., Disp: 100 tablet, Rfl: 0 .  levothyroxine (SYNTHROID, LEVOTHROID) 25 MCG tablet, Take 1 tablet (25 mcg total) by mouth daily., Disp: 30 tablet, Rfl: 0 .  LORazepam (ATIVAN) 1 MG tablet, take 1 tablet by mouth at bedtime if needed, Disp: 30 tablet, Rfl: 0 .  metoprolol tartrate (LOPRESSOR) 25 MG tablet, TAKE 1/2 TABLET BY MOUTH TWICE A DAY (Patient taking differently: TAKE 1 TABLET BY MOUTH TWICE A DAY), Disp: 30 tablet, Rfl:  3 .  PARoxetine (PAXIL) 40 MG tablet, Take 1 tablet (40 mg total) by mouth daily., Disp: 30 tablet, Rfl: 0 .  esomeprazole (NEXIUM) 40 MG capsule, Take 1 capsule (40 mg total) by mouth daily. (Patient not taking: Reported on 01/17/2017), Disp: 30 capsule, Rfl: 12 .  furosemide (LASIX) 20 MG tablet, Take 1 tablet (20 mg total) by mouth daily as needed. (Patient not taking: Reported on 01/17/2017), Disp: 30 tablet, Rfl: 3 .  potassium chloride (K-DUR) 10 MEQ tablet, Take 1 tablet (10 mEq total) by mouth daily as needed., Disp: 30 tablet, Rfl: 3 .  simethicone (MYLICON) 80 MG chewable tablet, Chew 80 mg by mouth daily as needed for flatulence., Disp: , Rfl:   Review of Systems  Constitutional: Negative.   HENT: Negative.   Eyes: Negative.   Respiratory: Positive for shortness of breath.   Cardiovascular: Negative.   Gastrointestinal: Positive for abdominal pain.  Endocrine: Negative.   Genitourinary: Negative.   Musculoskeletal: Negative.   Skin: Negative.   Allergic/Immunologic: Negative.   Neurological: Negative.   Hematological: Negative.   Psychiatric/Behavioral: Positive for dysphoric mood. The patient is nervous/anxious.     Social History  Substance Use Topics  . Smoking status: Never Smoker  . Smokeless tobacco: Never Used  . Alcohol use No   Objective:   BP  124/68 (BP Location: Left Arm, Patient Position: Sitting, Cuff Size: Large)   Pulse 90   Temp 97.8 F (36.6 C) (Oral)   Resp 16   Wt 207 lb (93.9 kg)   BMI 36.67 kg/m  Vitals:   01/17/17 1607  BP: 124/68  Pulse: 90  Resp: 16  Temp: 97.8 F (36.6 C)  TempSrc: Oral  Weight: 207 lb (93.9 kg)     Physical Exam  Constitutional: She is oriented to person, place, and time. She appears well-developed and well-nourished.  Mild obesity.  HENT:  Head: Normocephalic and atraumatic.  Right Ear: External ear normal.  Left Ear: External ear normal.  Nose: Nose normal.  Eyes: Pupils are equal, round, and reactive  to light. Conjunctivae and EOM are normal. No scleral icterus.  Neck: Normal range of motion. Neck supple. No thyromegaly present.  Cardiovascular: Normal rate, regular rhythm, normal heart sounds and intact distal pulses.   Pulmonary/Chest: Effort normal and breath sounds normal.  Abdominal: Soft. Bowel sounds are normal.  Musculoskeletal: Normal range of motion.  Neurological: She is alert and oriented to person, place, and time. She has normal reflexes.  Skin: Skin is warm and dry.  Psychiatric: She has a normal mood and affect. Her behavior is normal. Judgment and thought content normal.        Assessment & Plan:     1. Other depression Pt has been off of medications. PHQ-9 16 today. Will follow up in 1 month.  - buPROPion (WELLBUTRIN XL) 150 MG 24 hr tablet; Take 1 tablet (150 mg total) by mouth daily.  Dispense: 90 tablet; Refill: 3 - LORazepam (ATIVAN) 1 MG tablet; take 1 tablet by mouth at bedtime if needed  Dispense: 90 tablet; Refill: 1  2. Essential (primary) hypertension  - hydrochlorothiazide (HYDRODIURIL) 25 MG tablet; Take 1 tablet (25 mg total) by mouth daily.  Dispense: 90 tablet; Refill: 3 - CBC with Differential/Platelet - TSH  3. Other specified hypothyroidism  - levothyroxine (SYNTHROID, LEVOTHROID) 25 MCG tablet; Take 1 tablet (25 mcg total) by mouth daily.  Dispense: 90 tablet; Refill: 3  4. Need for influenza vaccination  - Flu vaccine HIGH DOSE PF  5. Gastroesophageal reflux disease, esophagitis presence not specified Restart Nexium. Follow up in 1 month - esomeprazole (NEXIUM) 40 MG capsule; Take 1 capsule (40 mg total) by mouth daily.  Dispense: 90 capsule; Refill: 3  6. Chronic nonintractable headache, unspecified headache type I definitely think patient has a rebound component to these headaches. Have instructed her to take medications as little as possible. - butalbital-aspirin-caffeine (FIORINAL) 50-325-40 MG capsule; Take 1 capsule by mouth 2  (two) times daily as needed for headache.  Dispense: 55 capsule; Refill: 2  7. Anxiety, generalized   8. Osteoarthritis, unspecified osteoarthritis type, unspecified site  - HYDROcodone-acetaminophen (NORCO/VICODIN) 5-325 MG tablet; Take 1-2 tablets by mouth every 6 (six) hours as needed for moderate pain.  Dispense: 55 tablet; Refill: 0  9. Mixed hyperlipidemia  - Lipid panel - Comprehensive metabolic panel     HPI, Exam, and A&P Transcribed under the direction and in the presence of Richard L. Wendelyn BreslowGilbert Jr, MD  Electronically Signed: Silvio PateBrittany O'Dell, CMA  I have done the exam and reviewed the chart and it is accurate to the best of my knowledge. [employer]  technology has been used and  any errors in dictation or transcription are unintentional. Julieanne Mansonichard Gilbert M.D. [facility]  Megan Mansichard Gilbert Jr, MD  [facility]  

## 2017-02-12 ENCOUNTER — Other Ambulatory Visit: Payer: Self-pay | Admitting: Physician Assistant

## 2017-02-12 DIAGNOSIS — F3289 Other specified depressive episodes: Secondary | ICD-10-CM

## 2017-02-12 NOTE — Telephone Encounter (Signed)
Pharmacy requesting refills. Thanks!  

## 2017-02-19 ENCOUNTER — Other Ambulatory Visit: Payer: Self-pay

## 2017-02-19 ENCOUNTER — Ambulatory Visit: Payer: PPO | Admitting: Family Medicine

## 2017-02-19 VITALS — BP 118/76 | HR 80 | Temp 98.2°F | Resp 16 | Wt 211.0 lb

## 2017-02-19 DIAGNOSIS — E782 Mixed hyperlipidemia: Secondary | ICD-10-CM | POA: Diagnosis not present

## 2017-02-19 DIAGNOSIS — I10 Essential (primary) hypertension: Secondary | ICD-10-CM

## 2017-02-19 DIAGNOSIS — K219 Gastro-esophageal reflux disease without esophagitis: Secondary | ICD-10-CM | POA: Diagnosis not present

## 2017-02-19 NOTE — Progress Notes (Signed)
Latoya Cordova  MRN: [id number] DOB: [birth year]  Subjective:  HPI  Patient is here for 1 month follow up on depression and GERD. Last office visit was on 01/17/17. At that time patient was off her medications so this was refilled and to follow up today. Patient is doing much better since been back on all of her medications. The tight sensation she felt in her esophagus and stomach pains have improved since taking Nexium again. Emotionally feels much better.  Lab work was ordered on last visit and patient had this done today before her appointment.  Patient Active Problem List   Diagnosis Date Noted  . Chronic diastolic CHF (congestive heart failure) (HCC) 05/30/2016  . Sinus tachycardia 06/17/2015  . Diastolic dysfunction   . Hypertension   . Chest pain 04/08/2015  . Pneumonia 04/08/2015  . SOB (shortness of breath)   . Hypoxia   . Morbid obesity due to excess calories (HCC)   . Fibromyalgia   . Allergic rhinitis 01/12/2015  . Clinical depression 01/12/2015  . Essential (primary) hypertension 01/12/2015  . Anxiety, generalized 01/12/2015  . Fibrositis 01/12/2015  . Acid reflux 01/12/2015  . Need for prophylactic hormone replacement therapy (postmenopausal) 01/12/2015  . HLD (hyperlipidemia) 01/12/2015  . Adult hypothyroidism 01/12/2015  . Arthritis, degenerative 01/12/2015  . Cardiac murmur 01/12/2015  . Osteopenia 01/12/2015  . Adiposity 01/12/2015    Past Medical History:  Diagnosis Date  . Anxiety   . Diastolic dysfunction    a. echo 08/2014: EF 50-55%, mild MR; b. echo 03/2015: EF 60-65%, RWMA could not be excluded, GR1DD, ascending aorta mildly dilated at 37mm, normal right-sided pressure  . Hypertension   . Morbid obesity (HCC)     Social History   Socioeconomic History  . Marital status: Widowed    Spouse name: Not on file  . Number of children: Not on file  . Years of education: Not on file  . Highest education level: Not on file  Social Needs  .  Financial resource strain: Not on file  . Food insecurity - worry: Not on file  . Food insecurity - inability: Not on file  . Transportation needs - medical: Not on file  . Transportation needs - non-medical: Not on file  Occupational History  . Not on file  Tobacco Use  . Smoking status: Never Smoker  . Smokeless tobacco: Never Used  Substance and Sexual Activity  . Alcohol use: No  . Drug use: No  . Sexual activity: Not on file  Other Topics Concern  . Not on file  Social History Narrative  . Not on file    Outpatient Encounter Medications as of 02/19/2017  Medication Sig Note  . buPROPion (WELLBUTRIN XL) 150 MG 24 hr tablet Take 1 tablet (150 mg total) by mouth daily.   . butalbital-aspirin-caffeine (FIORINAL) 50-325-40 MG capsule Take 1 capsule by mouth 2 (two) times daily as needed for headache.   . esomeprazole (NEXIUM) 40 MG capsule Take 1 capsule (40 mg total) by mouth daily.   . hydrochlorothiazide (HYDRODIURIL) 25 MG tablet Take 1 tablet (25 mg total) by mouth daily.   Marland Kitchen. HYDROcodone-acetaminophen (NORCO/VICODIN) 5-325 MG tablet Take 1-2 tablets by mouth every 6 (six) hours as needed for moderate pain.   Marland Kitchen. levothyroxine (SYNTHROID, LEVOTHROID) 25 MCG tablet Take 1 tablet (25 mcg total) by mouth daily.   Marland Kitchen. LORazepam (ATIVAN) 1 MG tablet take 1 tablet by mouth at bedtime if needed   . metoprolol tartrate (LOPRESSOR)  25 MG tablet TAKE 1/2 TABLET BY MOUTH TWICE A DAY (Patient taking differently: TAKE 1 TABLET BY MOUTH TWICE A DAY)   . PARoxetine (PAXIL) 40 MG tablet take 1 tablet by mouth once daily   . simethicone (MYLICON) 80 MG chewable tablet Chew 80 mg by mouth daily as needed for flatulence. 04/08/2015: Pt took 3 tabs last night.   . furosemide (LASIX) 20 MG tablet Take 1 tablet (20 mg total) by mouth daily as needed. (Patient not taking: Reported on 01/17/2017)   . PNEUMOVAX 23 25 MCG/0.5ML injection inject 0.5 milliliter intramuscularly   . potassium chloride (K-DUR) 10  MEQ tablet Take 1 tablet (10 mEq total) by mouth daily as needed.    No facility-administered encounter medications on file as of 02/19/2017.     Allergies  Allergen Reactions  . Sulfa Antibiotics     Per Mother  . Lodine  [Etodolac] Rash    Abdominal rash.    Review of Systems  Constitutional: Negative.   HENT: Positive for sinus pain.   Eyes: Negative.   Respiratory: Negative.   Cardiovascular: Negative.   Gastrointestinal: Negative.        Better on the medication  Musculoskeletal: Negative.   Skin: Negative.   Neurological: Positive for headaches (sometimes).  Endo/Heme/Allergies: Negative.   Psychiatric/Behavioral: Negative.        Better on the medications.    Objective:  BP 118/76   Pulse 80   Temp 98.2 F (36.8 C)   Resp 16   Wt 211 lb (95.7 kg)   BMI 37.38 kg/m   Physical Exam  Constitutional: She is oriented to person, place, and time and well-developed, well-nourished, and in no distress.  HENT:  Head: Normocephalic and atraumatic.  Eyes: Conjunctivae are normal. No scleral icterus.  Neck: No thyromegaly present.  Cardiovascular: Normal rate, regular rhythm and normal heart sounds.  Pulmonary/Chest: Effort normal and breath sounds normal.  Abdominal: Soft.  Neurological: She is alert and oriented to person, place, and time. Gait normal. GCS score is 15.  Skin: Skin is warm and dry.  Psychiatric: Memory, affect and judgment normal.    Assessment and Plan :  GERD Improved. HTN Obesity  I have done the exam and reviewed the chart and it is accurate to the best of my knowledge. [employer]  technology has been used and  any errors in dictation or transcription are unintentional. Julieanne Mansonichard Gilbert M.D. [facility]

## 2017-02-20 LAB — CBC WITH DIFFERENTIAL/PLATELET
BASOS ABS: 60 {cells}/uL (ref 0–200)
BASOS PCT: 0.7 %
EOS ABS: 189 {cells}/uL (ref 15–500)
Eosinophils Relative: 2.2 %
HEMATOCRIT: 43 % (ref 35.0–45.0)
HEMOGLOBIN: 14.9 g/dL (ref 11.7–15.5)
Lymphs Abs: 3449 cells/uL (ref 850–3900)
MCH: 30.7 pg (ref 27.0–33.0)
MCHC: 34.7 g/dL (ref 32.0–36.0)
MCV: 88.7 fL (ref 80.0–100.0)
MPV: 9.6 fL (ref 7.5–12.5)
Monocytes Relative: 8.3 %
NEUTROS ABS: 4188 {cells}/uL (ref 1500–7800)
Neutrophils Relative %: 48.7 %
Platelets: 217 10*3/uL (ref 140–400)
RBC: 4.85 10*6/uL (ref 3.80–5.10)
RDW: 12.4 % (ref 11.0–15.0)
Total Lymphocyte: 40.1 %
WBC: 8.6 10*3/uL (ref 3.8–10.8)
WBCMIX: 714 {cells}/uL (ref 200–950)

## 2017-02-20 LAB — COMPLETE METABOLIC PANEL WITH GFR
AG RATIO: 1.3 (calc) (ref 1.0–2.5)
ALBUMIN MSPROF: 4.1 g/dL (ref 3.6–5.1)
ALT: 19 U/L (ref 6–29)
AST: 22 U/L (ref 10–35)
Alkaline phosphatase (APISO): 48 U/L (ref 33–130)
BUN: 16 mg/dL (ref 7–25)
CALCIUM: 9.2 mg/dL (ref 8.6–10.4)
CO2: 27 mmol/L (ref 20–32)
CREATININE: 0.81 mg/dL (ref 0.60–0.93)
Chloride: 101 mmol/L (ref 98–110)
GFR, EST AFRICAN AMERICAN: 84 mL/min/{1.73_m2} (ref >=60)
GFR, EST NON AFRICAN AMERICAN: 73 mL/min/{1.73_m2} (ref >=60)
GLOBULIN: 3.1 g/dL (ref 1.9–3.7)
Glucose, Bld: 123 mg/dL — ABNORMAL HIGH (ref 65–99)
POTASSIUM: 3.9 mmol/L (ref 3.5–5.3)
SODIUM: 137 mmol/L (ref 135–146)
Total Bilirubin: 0.4 mg/dL (ref 0.2–1.2)
Total Protein: 7.2 g/dL (ref 6.1–8.1)

## 2017-02-20 LAB — LIPID PANEL
CHOLESTEROL: 194 mg/dL (ref <200)
HDL: 51 mg/dL (ref >50)
LDL Cholesterol (Calc): 109 mg/dL (calc) — ABNORMAL HIGH
NON-HDL CHOLESTEROL (CALC): 143 mg/dL — AB (ref <130)
TRIGLYCERIDES: 222 mg/dL — AB (ref <150)
Total CHOL/HDL Ratio: 3.8 (calc) (ref <5.0)

## 2017-02-20 LAB — TSH: TSH: 3.69 mIU/L (ref 0.40–4.50)

## 2017-06-28 NOTE — Telephone Encounter (Signed)
Complete

## 2017-07-09 ENCOUNTER — Ambulatory Visit (INDEPENDENT_AMBULATORY_CARE_PROVIDER_SITE_OTHER): Payer: PPO

## 2017-07-09 VITALS — BP 132/76 | HR 100 | Temp 97.7°F | Ht 63.0 in | Wt 213.8 lb

## 2017-07-09 DIAGNOSIS — Z0000 Encounter for general adult medical examination without abnormal findings: Secondary | ICD-10-CM | POA: Diagnosis not present

## 2017-07-09 NOTE — Patient Instructions (Signed)
[name] , Thank you for taking time to come for your Medicare Wellness Visit. I appreciate your ongoing commitment to your health goals. Please review the following plan we discussed and let me know if I can assist you in the future.   Screening recommendations/referrals: Colonoscopy: Up to date Mammogram: Pt to set up apt this year.  Bone Density: Up to date Recommended yearly ophthalmology/optometry visit for glaucoma screening and checkup Recommended yearly dental visit for hygiene and checkup  Vaccinations: Influenza vaccine: Up to date Pneumococcal vaccine: Up to date Tdap vaccine: Pt declines today.  Shingles vaccine: Pt declines today.     Advanced directives: Please bring a copy of your POA (Power of Attorney) and/or Living Will to your next appointment.   Conditions/risks identified: Obesity- recommend to start exercising 3 days a week for at least 30 minutes at a time.   Next appointment: 08/21/17 @ 9:00 AM with Dr Sullivan Lone.   Preventive Care 33 Years and Older, Female Preventive care refers to lifestyle choices and visits with your health care provider that can promote health and wellness. What does preventive care include?  A yearly physical exam. This is also called an annual well check.  Dental exams once or twice a year.  Routine eye exams. Ask your health care provider how often you should have your eyes checked.  Personal lifestyle choices, including:  Daily care of your teeth and gums.  Regular physical activity.  Eating a healthy diet.  Avoiding tobacco and drug use.  Limiting alcohol use.  Practicing safe sex.  Taking low-dose aspirin every day.  Taking vitamin and mineral supplements as recommended by your health care provider. What happens during an annual well check? The services and screenings done by your health care provider during your annual well check will depend on your age, overall health, lifestyle risk factors, and family history  of disease. Counseling  Your health care provider may ask you questions about your:  Alcohol use.  Tobacco use.  Drug use.  Emotional well-being.  Home and relationship well-being.  Sexual activity.  Eating habits.  History of falls.  Memory and ability to understand (cognition).  Work and work Astronomer.  Reproductive health. Screening  You may have the following tests or measurements:  Height, weight, and BMI.  Blood pressure.  Lipid and cholesterol levels. These may be checked every 5 years, or more frequently if you are over 43 years old.  Skin check.  Lung cancer screening. You may have this screening every year starting at age 17 if you have a 30-pack-year history of smoking and currently smoke or have quit within the past 15 years.  Fecal occult blood test (FOBT) of the stool. You may have this test every year starting at age 68.  Flexible sigmoidoscopy or colonoscopy. You may have a sigmoidoscopy every 5 years or a colonoscopy every 10 years starting at age 45.  Hepatitis C blood test.  Hepatitis B blood test.  Sexually transmitted disease (STD) testing.  Diabetes screening. This is done by checking your blood sugar (glucose) after you have not eaten for a while (fasting). You may have this done every 1-3 years.  Bone density scan. This is done to screen for osteoporosis. You may have this done starting at age 22.  Mammogram. This may be done every 1-2 years. Talk to your health care provider about how often you should have regular mammograms. Talk with your health care provider about your test results, treatment options, and if  necessary, the need for more tests. Vaccines  Your health care provider may recommend certain vaccines, such as:  Influenza vaccine. This is recommended every year.  Tetanus, diphtheria, and acellular pertussis (Tdap, Td) vaccine. You may need a Td booster every 10 years.  Zoster vaccine. You may need this after age  55.  Pneumococcal 13-valent conjugate (PCV13) vaccine. One dose is recommended after age 68.  Pneumococcal polysaccharide (PPSV23) vaccine. One dose is recommended after age 29. Talk to your health care provider about which screenings and vaccines you need and how often you need them. This information is not intended to replace advice given to you by your health care provider. Make sure you discuss any questions you have with your health care provider. Document Released: 04/09/2015 Document Revised: 12/01/2015 Document Reviewed: 01/12/2015 Elsevier Interactive Patient Education  2017 [place] Prevention in the Home Falls can cause injuries. They can happen to people of all ages. There are many things you can do to make your home safe and to help prevent falls. What can I do on the outside of my home?  Regularly fix the edges of walkways and driveways and fix any cracks.  Remove anything that might make you trip as you walk through a door, such as a raised step or threshold.  Trim any bushes or trees on the path to your home.  Use bright outdoor lighting.  Clear any walking paths of anything that might make someone trip, such as rocks or tools.  Regularly check to see if handrails are loose or broken. Make sure that both sides of any steps have handrails.  Any raised decks and porches should have guardrails on the edges.  Have any leaves, snow, or ice cleared regularly.  Use sand or salt on walking paths during winter.  Clean up any spills in your garage right away. This includes oil or grease spills. What can I do in the bathroom?  Use night lights.  Install grab bars by the toilet and in the tub and shower. Do not use towel bars as grab bars.  Use non-skid mats or decals in the tub or shower.  If you need to sit down in the shower, use a plastic, non-slip stool.  Keep the floor dry. Clean up any water that spills on the floor as soon as it happens.  Remove  soap buildup in the tub or shower regularly.  Attach bath mats securely with double-sided non-slip rug tape.  Do not have throw rugs and other things on the floor that can make you trip. What can I do in the bedroom?  Use night lights.  Make sure that you have a light by your bed that is easy to reach.  Do not use any sheets or blankets that are too big for your bed. They should not hang down onto the floor.  Have a firm chair that has side arms. You can use this for support while you get dressed.  Do not have throw rugs and other things on the floor that can make you trip. What can I do in the kitchen?  Clean up any spills right away.  Avoid walking on wet floors.  Keep items that you use a lot in easy-to-reach places.  If you need to reach something above you, use a strong step stool that has a grab bar.  Keep electrical cords out of the way.  Do not use floor polish or wax that makes floors slippery. If you must  use wax, use non-skid floor wax.  Do not have throw rugs and other things on the floor that can make you trip. What can I do with my stairs?  Do not leave any items on the stairs.  Make sure that there are handrails on both sides of the stairs and use them. Fix handrails that are broken or loose. Make sure that handrails are as long as the stairways.  Check any carpeting to make sure that it is firmly attached to the stairs. Fix any carpet that is loose or worn.  Avoid having throw rugs at the top or bottom of the stairs. If you do have throw rugs, attach them to the floor with carpet tape.  Make sure that you have a light switch at the top of the stairs and the bottom of the stairs. If you do not have them, ask someone to add them for you. What else can I do to help prevent falls?  Wear shoes that:  Do not have high heels.  Have rubber bottoms.  Are comfortable and fit you well.  Are closed at the toe. Do not wear sandals.  If you use a  stepladder:  Make sure that it is fully opened. Do not climb a closed stepladder.  Make sure that both sides of the stepladder are locked into place.  Ask someone to hold it for you, if possible.  Clearly mark and make sure that you can see:  Any grab bars or handrails.  First and last steps.  Where the edge of each step is.  Use tools that help you move around (mobility aids) if they are needed. These include:  Canes.  Walkers.  Scooters.  Crutches.  Turn on the lights when you go into a dark area. Replace any light bulbs as soon as they burn out.  Set up your furniture so you have a clear path. Avoid moving your furniture around.  If any of your floors are uneven, fix them.  If there are any pets around you, be aware of where they are.  Review your medicines with your doctor. Some medicines can make you feel dizzy. This can increase your chance of falling. Ask your doctor what other things that you can do to help prevent falls. This information is not intended to replace advice given to you by your health care provider. Make sure you discuss any questions you have with your health care provider. Document Released: 01/07/2009 Document Revised: 08/19/2015 Document Reviewed: 04/17/2014 Elsevier Interactive Patient Education  2017 [employer]

## 2017-07-09 NOTE — Progress Notes (Signed)
Subjective:   Latoya Cordova is a 72 y.o. female who presents for Medicare Annual (Subsequent) preventive examination.  Review of Systems:  N/A  Cardiac Risk Factors include: advanced age (>68men, >77 women);hypertension;obesity (BMI >30kg/m2)     Objective:     Vitals: BP 132/76 (BP Location: Left Arm)   Pulse 100   Temp 97.7 F (36.5 C) (Oral)   Ht 5\' 3"  (1.6 m)   Wt 213 lb 12.8 oz (97 kg)   BMI 37.87 kg/m   Body mass index is 37.87 kg/m.  Advanced Directives 07/09/2017 04/08/2015  Does Patient Have a Medical Advance Directive? Yes No  Type of Estate agent of [place];Living will -  Copy of Healthcare Power of Attorney in Chart? No - copy requested -  Would patient like information on creating a medical advance directive? - No - patient declined information    Tobacco Social History   Tobacco Use  Smoking Status Never Smoker  Smokeless Tobacco Never Used     Counseling given: Not Answered   Clinical Intake:  Pre-visit preparation completed: Yes  Pain : No/denies pain Pain Score: 0-No pain     Nutritional Status: BMI > 30  Obese Nutritional Risks: None Diabetes: No  How often do you need to have someone help you when you read instructions, pamphlets, or other written materials from your doctor or pharmacy?: 1 - Never  Interpreter Needed?: No  Information entered by :: [facility], LPN  Past Medical History:  Diagnosis Date  . Anxiety   . Diastolic dysfunction    a. echo 08/2014: EF 50-55%, mild MR; b. echo 03/2015: EF 60-65%, RWMA could not be excluded, GR1DD, ascending aorta mildly dilated at 37mm, normal right-sided pressure  . Hypertension   . Morbid obesity (HCC)    Past Surgical History:  Procedure Laterality Date  . ABDOMINAL HYSTERECTOMY  08/18/2004  . APPENDECTOMY     Family History  Problem Relation Age of Onset  . Ulcers Father   . Heart attack Paternal Grandfather   . Hypertension Mother   . Congestive Heart  Failure Mother   . Breast cancer Maternal Aunt   . Breast cancer Cousin    Social History   Socioeconomic History  . Marital status: Widowed    Spouse name: Not on file  . Number of children: 1  . Years of education: Not on file  . Highest education level: Associate degree: occupational, Scientist, product/process development, or vocational program  Occupational History  . Occupation: retired  Engineer, production  . Financial resource strain: Not hard at all  . Food insecurity:    Worry: Never true    Inability: Never true  . Transportation needs:    Medical: No    Non-medical: No  Tobacco Use  . Smoking status: Never Smoker  . Smokeless tobacco: Never Used  Substance and Sexual Activity  . Alcohol use: No  . Drug use: No  . Sexual activity: Not on file  Lifestyle  . Physical activity:    Days per week: Not on file    Minutes per session: Not on file  . Stress: To some extent  Relationships  . Social connections:    Talks on phone: Not on file    Gets together: Not on file    Attends religious service: Not on file    Active member of club or organization: Not on file    Attends meetings of clubs or organizations: Not on file    Relationship status:  Not on file  Other Topics Concern  . Not on file  Social History Narrative  . Not on file    Outpatient Encounter Medications as of 07/09/2017  Medication Sig  . buPROPion (WELLBUTRIN XL) 150 MG 24 hr tablet Take 1 tablet (150 mg total) by mouth daily.  . butalbital-aspirin-caffeine (FIORINAL) 50-325-40 MG capsule Take 1 capsule by mouth 2 (two) times daily as needed for headache.  . esomeprazole (NEXIUM) 40 MG capsule Take 1 capsule (40 mg total) by mouth daily.  . furosemide (LASIX) 20 MG tablet Take 1 tablet (20 mg total) by mouth daily as needed.  . hydrochlorothiazide (HYDRODIURIL) 25 MG tablet Take 1 tablet (25 mg total) by mouth daily.  Marland Kitchen HYDROcodone-acetaminophen (NORCO/VICODIN) 5-325 MG tablet Take 1-2 tablets by mouth every 6 (six) hours as  needed for moderate pain.  Marland Kitchen levothyroxine (SYNTHROID, LEVOTHROID) 25 MCG tablet Take 1 tablet (25 mcg total) by mouth daily.  Marland Kitchen LORazepam (ATIVAN) 1 MG tablet take 1 tablet by mouth at bedtime if needed  . metoprolol tartrate (LOPRESSOR) 25 MG tablet TAKE 1/2 TABLET BY MOUTH TWICE A DAY (Patient taking differently: TAKE 1 TABLET BY MOUTH TWICE A DAY)  . PARoxetine (PAXIL) 40 MG tablet take 1 tablet by mouth once daily  . PNEUMOVAX 23 25 MCG/0.5ML injection inject 0.5 milliliter intramuscularly  . potassium chloride (K-DUR) 10 MEQ tablet Take 1 tablet (10 mEq total) by mouth daily as needed.  . simethicone (MYLICON) 80 MG chewable tablet Chew 80 mg by mouth daily as needed for flatulence.   No facility-administered encounter medications on file as of 07/09/2017.     Activities of Daily Living In your present state of health, do you have any difficulty performing the following activities: 07/09/2017 01/17/2017  Hearing? N N  Vision? Y N  Comment Pt needs a new prescription and plans to have an eye exam this year.  -  Difficulty concentrating or making decisions? N N  Walking or climbing stairs? N Y  Dressing or bathing? N N  Doing errands, shopping? N N  Preparing Food and eating ? N -  Using the Toilet? N -  In the past six months, have you accidently leaked urine? Y -  Comment Occasionally with pressure or holding, wears protection.  -  Do you have problems with loss of bowel control? N -  Managing your Medications? N -  Managing your Finances? N -  Housekeeping or managing your Housekeeping? N -  Some recent data might be hidden    Patient Care Team: Maple Hudson., MD as PCP - General (Family Medicine)    Assessment:   This is a routine wellness examination for Latoya Cordova.  Exercise Activities and Dietary recommendations Current Exercise Habits: The patient does not participate in regular exercise at present, Exercise limited by: None identified  Goals    . Exercise 3x  per week (30 min per time)     Recommend to start exercising 3 days a week for at least 30 minutes at a time.        Fall Risk Fall Risk  07/09/2017 02/19/2017 08/31/2015 04/14/2015  Falls in the past year? No No No No   Is the patient's home free of loose throw rugs in walkways, pet beds, electrical cords, etc?   yes      Grab bars in the bathroom? no      Handrails on the stairs?   yes      Adequate lighting?  yes  Timed Get Up and Go performed: N/A  Depression Screen PHQ 2/9 Scores 07/09/2017 02/19/2017 01/17/2017 08/31/2015  PHQ - 2 Score 2 1 3 1   PHQ- 9 Score 8 4 14  -     Cognitive Function: Pt declined screening today.        Immunization History  Administered Date(s) Administered  . Influenza, High Dose Seasonal PF 01/17/2017  . Influenza-Unspecified 02/24/2013  . Pneumococcal Conjugate-13 08/08/2013  . Pneumococcal Polysaccharide-23 05/24/2011, 01/20/2017  . Tdap 12/13/2006    Qualifies for Shingles Vaccine? Due for Shingles vaccine. Declined my offer to administer today. Education has been provided regarding the importance of this vaccine. Pt has been advised to call her insurance company to determine her out of pocket expense. Advised she may also receive this vaccine at her local pharmacy or Health Dept. Verbalized acceptance and understanding.  Screening Tests Health Maintenance  Topic Date Due  . Hepatitis C Screening  19-Aug-1945  . TETANUS/TDAP  12/12/2016  . MAMMOGRAM  07/06/2017  . INFLUENZA VACCINE  10/25/2017  . COLONOSCOPY  08/27/2023  . DEXA SCAN  Completed  . PNA vac Low Risk Adult  Completed    Cancer Screenings: Lung: Low Dose CT Chest recommended if Age 64-80 years, 30 pack-year currently smoking OR have quit w/in 15years. Patient does not qualify. Breast:  Up to date on Mammogram? No, pt to set up an appointment this year.    Up to date of Bone Density/Dexa? Yes Colorectal: Up to date  Additional Screenings:  Hepatitis C Screening: Pt  declines today.      Plan:  I have personally reviewed and addressed the Medicare Annual Wellness questionnaire and have noted the following in the patient's chart:  A. Medical and social history B. Use of alcohol, tobacco or illicit drugs  C. Current medications and supplements D. Functional ability and status E.  Nutritional status F.  Physical activity G. Advance directives H. List of other physicians I.  Hospitalizations, surgeries, and ER visits in previous 12 months J.  Vitals K. Screenings such as hearing and vision if needed, cognitive and depression L. Referrals and appointments - none  In addition, I have reviewed and discussed with patient certain preventive protocols, quality metrics, and best practice recommendations. A written personalized care plan for preventive services as well as general preventive health recommendations were provided to patient.  See attached scanned questionnaire for additional information.   Signed,  Hyacinth MeekerMckenzie Anjel Perfetti, LPN Nurse Health Advisor   Nurse Recommendations: Pt declined the tetanus vaccine and Hep C lab test today. Pt plans to set up a mammogram this year. Pt would like to discuss having the Hepatitis C lab test further with PCP at next apt.

## 2017-08-13 NOTE — Progress Notes (Signed)
Cardiology Office Note  Date:  08/14/2017   ID:  Latoya Cordova, DOB 10/23/1945, MRN [id number]  PCP:  Maple Hudson., MD   Chief Complaint  Patient presents with  . OTHER    2 month f/u c/o sob; Pt requested refills on Metoprolol 90 day. Meds reviewed verbally with pt.    HPI:  Latoya Cordova is a 72 y.o. female with history of  HTN,  obesity,   anxiety with previous admission to the hospital 1/12 to 04/11/15 for acute respiratory distress in the setting of likely viral pneumonitis or pneumonia, Treated with levaquin She presents today for follow-up of her diastolic CHF, tachycardia  Continues to have high fluid intake Lasix 2 to 3 x a week For SOB, swelling   No regular exercise program,  mild fluid retention in her hands, legs, abdomen Weight stable Very SOB with stairs, has to take a break  EKG personally reviewed by myself on todays visit Shows sinus tachycardia 114 poor,  R wave progression  Other past medical history reviewed Previous Echo showed EF 60-65%, RWMA could be be excluded, GR1DD, ascending aorta mildly dilated at 37 mm, normal right sided pressure.  CT scan of the chest There is no significant coronary artery calcifications, no significant carotid, aortic atherosclerosis  CTA was negative for PE, though there was motion artifact. CXR showed increased interstital prominence suggesting possibly a viral etiology.    PMH:   has a past medical history of Anxiety, Diastolic dysfunction, Hypertension, and Morbid obesity (HCC).  PSH:    Past Surgical History:  Procedure Laterality Date  . ABDOMINAL HYSTERECTOMY  08/18/2004  . APPENDECTOMY      Current Outpatient Medications  Medication Sig Dispense Refill  . buPROPion (WELLBUTRIN XL) 150 MG 24 hr tablet Take 1 tablet (150 mg total) by mouth daily. 90 tablet 3  . butalbital-aspirin-caffeine (FIORINAL) 50-325-40 MG capsule Take 1 capsule by mouth 2 (two) times daily as needed for headache. 55  capsule 2  . furosemide (LASIX) 20 MG tablet Take 1 tablet (20 mg total) by mouth daily as needed. 30 tablet 3  . hydrochlorothiazide (HYDRODIURIL) 25 MG tablet Take 1 tablet (25 mg total) by mouth daily. 90 tablet 3  . HYDROcodone-acetaminophen (NORCO/VICODIN) 5-325 MG tablet Take 1-2 tablets by mouth every 6 (six) hours as needed for moderate pain. 55 tablet 0  . levothyroxine (SYNTHROID, LEVOTHROID) 25 MCG tablet Take 1 tablet (25 mcg total) by mouth daily. 90 tablet 3  . LORazepam (ATIVAN) 1 MG tablet take 1 tablet by mouth at bedtime if needed 90 tablet 1  . PARoxetine (PAXIL) 40 MG tablet take 1 tablet by mouth once daily 30 tablet 5  . PNEUMOVAX 23 25 MCG/0.5ML injection inject 0.5 milliliter intramuscularly  0  . metoprolol succinate (TOPROL XL) 25 MG 24 hr tablet Take 1 tablet (25 mg total) by mouth daily. 90 tablet 4  . potassium chloride (K-DUR) 10 MEQ tablet Take 1 tablet (10 mEq total) by mouth daily as needed. 30 tablet 3   No current facility-administered medications for this visit.      Allergies:   Sulfa antibiotics and Lodine  [etodolac]   Social History:  The patient  reports that she has never smoked. She has never used smokeless tobacco. She reports that she does not drink alcohol or use drugs.   Family History:   family history includes Breast cancer in her cousin and maternal aunt; Congestive Heart Failure in her mother; Heart attack  in her paternal grandfather; Hypertension in her mother; Ulcers in her father.    Review of Systems: Review of Systems  Constitutional: Negative.   Respiratory: Positive for shortness of breath.   Cardiovascular: Positive for leg swelling.  Gastrointestinal: Negative.   Musculoskeletal: Negative.   Neurological: Negative.   Psychiatric/Behavioral: Negative.   All other systems reviewed and are negative.    PHYSICAL EXAM: VS:  BP 120/80 (BP Location: Left Arm, Patient Position: Sitting, Cuff Size: Large)   Pulse (!) 114   Ht 5'  4" (1.626 m)   Wt 213 lb (96.6 kg)   BMI 36.56 kg/m  , BMI Body mass index is 36.56 kg/m. Constitutional:  oriented to person, place, and time. No distress. obese HENT:  Head: Normocephalic and atraumatic.  Eyes:  no discharge. No scleral icterus.  Neck: Normal range of motion. Neck supple. No JVD present.  Cardiovascular: tachycardic regular rhythm, normal heart sounds and intact distal pulses. Exam reveals no gallop and no friction rub. No edema No murmur heard. Pulmonary/Chest: Effort normal and breath sounds normal. No stridor. No respiratory distress.  no wheezes.  no rales.  no tenderness.  Abdominal: Soft.  no distension.  no tenderness.  Musculoskeletal: Normal range of motion.  no  tenderness or deformity.  Neurological:  normal muscle tone. Coordination normal. No atrophy Skin: Skin is warm and dry. No rash noted. not diaphoretic.  Psychiatric:  normal mood and affect. behavior is normal. Thought content normal.    Recent Labs: 02/19/2017: ALT 19; BUN 16; Creat 0.81; Hemoglobin 14.9; Platelets 217; Potassium 3.9; Sodium 137; TSH 3.69    Lipid Panel Lab Results  Component Value Date   CHOL 194 02/19/2017   HDL 51 02/19/2017   LDLCALC 109 (H) 02/19/2017   TRIG 222 (H) 02/19/2017      Wt Readings from Last 3 Encounters:  08/14/17 213 lb (96.6 kg)  07/09/17 213 lb 12.8 oz (97 kg)  02/19/17 211 lb (95.7 kg)      ASSESSMENT AND PLAN:  Essential (primary) hypertension - Plan: EKG 12-Lead Blood pressure is well controlled on today's visit. No changes made to the medications. stable  Hyperlipidemia, unspecified hyperlipidemia type - Plan: EKG 12-Lead no significant calcifications on previous ct scan  We have encouraged continued exercise, careful diet management in an effort to lose weight.  Essential hypertension - Plan: EKG 12-Lead Blood pressure is well controlled on today's visit. No changes made to the medications. stable  Chest pain, unspecified type -  Plan: EKG 12-Lead No chest pain sx, no anginal sx No further workup at this time  SOB (shortness of breath) - Plan: EKG 12-Lead morbid obesity, Deconditioned Recommended weight loss, exercise program  Sinus tachycardia - Plan: EKG 12-Lead Rate relatively well-controlled on today's visit,  Ran out of her metoprolol, we have sent in a refill for metoprolol succinate 25 daily She will monitor her heart rate at home Might need 50 mg daily  Chronic diastolic CHF (congestive heart failure) (HCC) weight gain,shortness of breath,continue Lasix several times per week Discussed salt and fluid intake with her, she has no plans on moderating her fluid intake   Total encounter time more than 25 minutes  Greater than 50% was spent in counseling and coordination of care with the patient  Disposition:   F/U  12 months As needed   Orders Placed This Encounter  Procedures  . EKG 12-Lead     Signed, Dossie Arbour, M.D., Ph.D. 08/14/2017  [facility]  [place], [place]

## 2017-08-14 ENCOUNTER — Ambulatory Visit: Payer: PPO | Admitting: Cardiovascular Disease

## 2017-08-14 ENCOUNTER — Encounter: Payer: Self-pay | Admitting: Cardiovascular Disease

## 2017-08-14 VITALS — BP 120/80 | HR 114 | Ht 64.0 in | Wt 213.0 lb

## 2017-08-14 DIAGNOSIS — I10 Essential (primary) hypertension: Secondary | ICD-10-CM

## 2017-08-14 DIAGNOSIS — R000 Tachycardia, unspecified: Secondary | ICD-10-CM

## 2017-08-14 DIAGNOSIS — R0602 Shortness of breath: Secondary | ICD-10-CM

## 2017-08-14 DIAGNOSIS — R079 Chest pain, unspecified: Secondary | ICD-10-CM

## 2017-08-14 DIAGNOSIS — I5032 Chronic diastolic (congestive) heart failure: Secondary | ICD-10-CM

## 2017-08-14 MED ORDER — METOPROLOL SUCCINATE ER 25 MG PO TB24: 25.0000 mg | ORAL_TABLET | Freq: Every day | ORAL | 4 refills | Status: DC

## 2017-08-14 NOTE — Patient Instructions (Addendum)
Medication Instructions:   Continue to take furosemide/lasix as needed with potassium for swelling, shortness of breath  Labwork:  No new labs needed  Testing/Procedures:  No further testing at this time  Have your son research CT coronary calcium score  $150   Follow-Up: It was a pleasure seeing you in the office today. Please call us if you have new issues that need to be addressed before your next appt.  [phone]  Your physician wants you to follow-up in: 12 months as needed  You will receive a reminder letter in the mail two months in advance. If you don't receive a letter, please call our office to schedule the follow-up appointment.  If you need a refill on your cardiac medications before your next appointment, please call your pharmacy.  For educational health videos Log in to : [email or web address] Or : [email or web address], password : [email or web address]

## 2017-08-18 ENCOUNTER — Other Ambulatory Visit: Payer: Self-pay | Admitting: Family Medicine

## 2017-08-18 DIAGNOSIS — F3289 Other specified depressive episodes: Secondary | ICD-10-CM

## 2017-08-21 ENCOUNTER — Encounter: Payer: Self-pay | Admitting: Family Medicine

## 2017-09-10 ENCOUNTER — Other Ambulatory Visit: Payer: Self-pay | Admitting: Family Medicine

## 2017-09-10 DIAGNOSIS — F3289 Other specified depressive episodes: Secondary | ICD-10-CM

## 2017-09-10 MED ORDER — BUPROPION HCL ER (XL) 150 MG PO TB24: 150.0000 mg | ORAL_TABLET | Freq: Every day | ORAL | 3 refills | Status: DC

## 2017-09-10 NOTE — Telephone Encounter (Signed)
Pt contacted office for refill request on the following medications:  buPROPion (WELLBUTRIN XL) 150 MG 24 hr tablet   [facility] [address]  Pt stated [facility] advised they need a new Rx. Please advise. Thanks TNP

## 2017-09-12 ENCOUNTER — Ambulatory Visit (INDEPENDENT_AMBULATORY_CARE_PROVIDER_SITE_OTHER): Payer: PPO | Admitting: Family Medicine

## 2017-09-12 ENCOUNTER — Other Ambulatory Visit: Payer: Self-pay

## 2017-09-12 ENCOUNTER — Encounter: Payer: Self-pay | Admitting: Family Medicine

## 2017-09-12 VITALS — BP 118/76 | HR 78 | Temp 97.7°F | Ht 64.0 in | Wt 212.2 lb

## 2017-09-12 DIAGNOSIS — R51 Headache: Secondary | ICD-10-CM | POA: Diagnosis not present

## 2017-09-12 DIAGNOSIS — M1990 Unspecified osteoarthritis, unspecified site: Secondary | ICD-10-CM

## 2017-09-12 DIAGNOSIS — G8929 Other chronic pain: Secondary | ICD-10-CM

## 2017-09-12 DIAGNOSIS — Z0000 Encounter for general adult medical examination without abnormal findings: Secondary | ICD-10-CM | POA: Diagnosis not present

## 2017-09-12 DIAGNOSIS — Z1231 Encounter for screening mammogram for malignant neoplasm of breast: Secondary | ICD-10-CM | POA: Diagnosis not present

## 2017-09-12 DIAGNOSIS — Z1239 Encounter for other screening for malignant neoplasm of breast: Secondary | ICD-10-CM

## 2017-09-12 DIAGNOSIS — R519 Headache, unspecified: Secondary | ICD-10-CM

## 2017-09-12 NOTE — Telephone Encounter (Signed)
Patient call requesting refills for the following medication: butalbital-aspirin-caffeine (FIORINAL) 50-325-40 MG capsule   HYDROcodone-acetaminophen (NORCO/VICODIN) 5-325 MG tablet   Per patient she asked for the refills today when she was here at her appointment.

## 2017-09-12 NOTE — Progress Notes (Signed)
Patient: Latoya Cordova, Female    DOB: [birth year], 72 y.o.   MRN: [id number] Visit Date: 09/12/2017  Today's Provider: Megan Mans, MD   Chief Complaint  Patient presents with  . Annual Exam   Subjective:      Complete Physical Latoya Cordova is a 72 y.o. female. She feels well. She reports exercising lightly. She reports she is sleeping fairly well.  ----------------------------------------------------------- Patient had a AWE on 07/09/17   Review of Systems  Constitutional: Negative.   HENT: Negative.   Eyes: Negative.   Respiratory: Negative.   Cardiovascular: Negative.   Gastrointestinal: Negative.   Endocrine: Negative.   Genitourinary: Negative.   Musculoskeletal: Negative.   Skin: Negative.   Allergic/Immunologic: Negative.   Neurological: Negative.   Hematological: Negative.   Psychiatric/Behavioral: Negative.      Social History   Socioeconomic History  . Marital status: Widowed    Spouse name: Not on file  . Number of children: 1  . Years of education: Not on file  . Highest education level: Associate degree: occupational, Scientist, product/process development, or vocational program  Occupational History  . Occupation: retired  Engineer, production  . Financial resource strain: Not hard at all  . Food insecurity:    Worry: Never true    Inability: Never true  . Transportation needs:    Medical: No    Non-medical: No  Tobacco Use  . Smoking status: Never Smoker  . Smokeless tobacco: Never Used  Substance and Sexual Activity  . Alcohol use: No  . Drug use: No  . Sexual activity: Not on file  Lifestyle  . Physical activity:    Days per week: Not on file    Minutes per session: Not on file  . Stress: To some extent  Relationships  . Social connections:    Talks on phone: Not on file    Gets together: Not on file    Attends religious service: Not on file    Active member of club or organization: Not on file    Attends meetings of clubs or organizations: Not on  file    Relationship status: Not on file  . Intimate partner violence:    Fear of current or ex partner: Not on file    Emotionally abused: Not on file    Physically abused: Not on file    Forced sexual activity: Not on file  Other Topics Concern  . Not on file  Social History Narrative  . Not on file    Past Medical History:  Diagnosis Date  . Anxiety   . Diastolic dysfunction    a. echo 08/2014: EF 50-55%, mild MR; b. echo 03/2015: EF 60-65%, RWMA could not be excluded, GR1DD, ascending aorta mildly dilated at 37mm, normal right-sided pressure  . Hypertension   . Morbid obesity [facility])      Patient Active Problem List   Diagnosis Date Noted  . Chronic diastolic CHF (congestive heart failure) (HCC) 05/30/2016  . Sinus tachycardia 06/17/2015  . Diastolic dysfunction   . Hypertension   . Chest pain 04/08/2015  . Pneumonia 04/08/2015  . SOB (shortness of breath)   . Hypoxia   . Morbid obesity due to excess calories (HCC)   . Fibromyalgia   . Allergic rhinitis 01/12/2015  . Clinical depression 01/12/2015  . Anxiety, generalized 01/12/2015  . Fibrositis 01/12/2015  . Acid reflux 01/12/2015  . Need for prophylactic hormone replacement therapy (postmenopausal) 01/12/2015  . HLD (hyperlipidemia) 01/12/2015  .  Adult hypothyroidism 01/12/2015  . Arthritis, degenerative 01/12/2015  . Cardiac murmur 01/12/2015  . Osteopenia 01/12/2015  . Adiposity 01/12/2015    Past Surgical History:  Procedure Laterality Date  . ABDOMINAL HYSTERECTOMY  08/18/2004  . APPENDECTOMY      Her family history includes Breast cancer in her cousin and maternal aunt; Congestive Heart Failure in her mother; Heart attack in her paternal grandfather; Hypertension in her mother; Ulcers in her father.      Current Outpatient Medications:  .  buPROPion (WELLBUTRIN XL) 150 MG 24 hr tablet, Take 1 tablet (150 mg total) by mouth daily., Disp: 90 tablet, Rfl: 3 .  butalbital-aspirin-caffeine (FIORINAL)  50-325-40 MG capsule, Take 1 capsule by mouth 2 (two) times daily as needed for headache., Disp: 55 capsule, Rfl: 2 .  hydrochlorothiazide (HYDRODIURIL) 25 MG tablet, Take 1 tablet (25 mg total) by mouth daily., Disp: 90 tablet, Rfl: 3 .  HYDROcodone-acetaminophen (NORCO/VICODIN) 5-325 MG tablet, Take 1-2 tablets by mouth every 6 (six) hours as needed for moderate pain., Disp: 55 tablet, Rfl: 0 .  levothyroxine (SYNTHROID, LEVOTHROID) 25 MCG tablet, Take 1 tablet (25 mcg total) by mouth daily., Disp: 90 tablet, Rfl: 3 .  LORazepam (ATIVAN) 1 MG tablet, take 1 tablet by mouth at bedtime if needed, Disp: 90 tablet, Rfl: 1 .  metoprolol succinate (TOPROL XL) 25 MG 24 hr tablet, Take 1 tablet (25 mg total) by mouth daily., Disp: 90 tablet, Rfl: 4 .  PARoxetine (PAXIL) 40 MG tablet, TAKE 1 TABLET BY MOUTH ONCE DAILY, Disp: 30 tablet, Rfl: 1 .  PNEUMOVAX 23 25 MCG/0.5ML injection, inject 0.5 milliliter intramuscularly, Disp: , Rfl: 0 .  furosemide (LASIX) 20 MG tablet, Take 1 tablet (20 mg total) by mouth daily as needed., Disp: 30 tablet, Rfl: 3 .  potassium chloride (K-DUR) 10 MEQ tablet, Take 1 tablet (10 mEq total) by mouth daily as needed., Disp: 30 tablet, Rfl: 3  Patient Care Team: Maple HudsonGilbert, Mead Slane L Jr., MD as PCP - General (Family Medicine)     Objective:   Vitals: BP 118/76 (BP Location: Right Arm, Patient Position: Sitting, Cuff Size: Large)   Pulse 78   Temp 97.7 F (36.5 C) (Oral)   Ht 5\' 4"  (1.626 m)   Wt 212 lb 3.2 oz (96.3 kg)   SpO2 96%   BMI 36.42 kg/m   Physical Exam  Constitutional: She is oriented to person, place, and time. She appears well-developed and well-nourished.  HENT:  Head: Normocephalic and atraumatic.  Right Ear: External ear normal.  Left Ear: External ear normal.  Nose: Nose normal.  Mouth/Throat: Oropharynx is clear and moist.  Eyes: Conjunctivae are normal. No scleral icterus.  Neck: No thyromegaly present.  Cardiovascular: Normal rate, regular  rhythm and normal heart sounds.  Pulmonary/Chest: Effort normal and breath sounds normal.  Abdominal: Soft.  Musculoskeletal: She exhibits no edema.  Neurological: She is alert and oriented to person, place, and time.  Skin: Skin is warm and dry.  Psychiatric: She has a normal mood and affect. Her behavior is normal. Judgment and thought content normal.    Activities of Daily Living In your present state of health, do you have any difficulty performing the following activities: 07/09/2017 01/17/2017  Hearing? N N  Vision? Y N  Comment Pt needs a new prescription and plans to have an eye exam this year.  -  Difficulty concentrating or making decisions? N N  Walking or climbing stairs? N Y  Dressing or bathing?  N N  Doing errands, shopping? N N  Preparing Food and eating ? N -  Using the Toilet? N -  In the past six months, have you accidently leaked urine? Y -  Comment Occasionally with pressure or holding, wears protection.  -  Do you have problems with loss of bowel control? N -  Managing your Medications? N -  Managing your Finances? N -  Housekeeping or managing your Housekeeping? N -  Some recent data might be hidden    Fall Risk Assessment Fall Risk  07/09/2017 02/19/2017 08/31/2015 04/14/2015  Falls in the past year? No No No No     Depression Screen PHQ 2/9 Scores 07/09/2017 02/19/2017 01/17/2017 08/31/2015  PHQ - 2 Score 2 1 3 1   PHQ- 9 Score 8 4 14  -    Assessment & Plan:    Annual Physical Reviewed patient's Family Medical History Reviewed and updated list of patient's medical providers Assessment of cognitive impairment was done Assessed patient's functional ability Established a written schedule for health screening services Health Risk Assessent Completed and Reviewed  Exercise Activities and Dietary recommendations Goals    . Exercise 3x per week (30 min per time)     Recommend to start exercising 3 days a week for at least 30 minutes at a time.         Immunization History  Administered Date(s) Administered  . Influenza, High Dose Seasonal PF 01/17/2017  . Influenza-Unspecified 02/24/2013  . Pneumococcal Conjugate-13 08/08/2013  . Pneumococcal Polysaccharide-23 05/24/2011, 01/20/2017  . Tdap 12/13/2006    Health Maintenance  Topic Date Due  . Hepatitis C Screening  [birth year]  . MAMMOGRAM  07/06/2017  . TETANUS/TDAP  09/13/2018 (Originally 12/12/2016)  . INFLUENZA VACCINE  10/25/2017  . COLONOSCOPY  08/27/2023  . DEXA SCAN  Completed  . PNA vac Low Risk Adult  Completed     Discussed health benefits of physical activity, and encouraged her to engage in regular exercise appropriate for her age and condition.    ------------------------------------------------------------------------------------------------------------   I have done the exam and reviewed the above chart and it is accurate to the best of my knowledge. Dentist has been used in this note in any air is in the dictation or transcription are unintentional.  Megan Mans, MD  [facility]

## 2017-09-13 MED ORDER — HYDROCODONE-ACETAMINOPHEN 5-325 MG PO TABS: 1.0000 | ORAL_TABLET | Freq: Four times a day (QID) | ORAL | 0 refills | Status: DC | PRN

## 2017-09-13 MED ORDER — BUTALBITAL-ASPIRIN-CAFFEINE 50-325-40 MG PO CAPS: 1.0000 | ORAL_CAPSULE | Freq: Two times a day (BID) | ORAL | 2 refills | Status: DC | PRN

## 2017-09-15 MED ORDER — BUTALBITAL-ASPIRIN-CAFFEINE 50-325-40 MG PO CAPS: 1.0000 | ORAL_CAPSULE | Freq: Two times a day (BID) | ORAL | 2 refills | Status: DC | PRN

## 2017-09-15 MED ORDER — HYDROCODONE-ACETAMINOPHEN 5-325 MG PO TABS: 1.0000 | ORAL_TABLET | Freq: Four times a day (QID) | ORAL | 0 refills | Status: DC | PRN

## 2017-09-20 ENCOUNTER — Other Ambulatory Visit: Payer: Self-pay | Admitting: Family Medicine

## 2017-09-20 DIAGNOSIS — F3289 Other specified depressive episodes: Secondary | ICD-10-CM

## 2017-09-20 MED ORDER — PAROXETINE HCL 40 MG PO TABS: 40.0000 mg | ORAL_TABLET | Freq: Every day | ORAL | 1 refills | Status: DC

## 2017-09-20 NOTE — Telephone Encounter (Signed)
Please review. Thanks!  

## 2017-09-20 NOTE — Telephone Encounter (Signed)
Patient needs refill on Paxil 40 mg. Qd, but wants to get 90 days worth at a time please. [facility] [address]

## 2017-10-23 ENCOUNTER — Other Ambulatory Visit: Payer: Self-pay | Admitting: Family Medicine

## 2017-10-23 DIAGNOSIS — F3289 Other specified depressive episodes: Secondary | ICD-10-CM

## 2018-02-06 ENCOUNTER — Other Ambulatory Visit: Payer: Self-pay | Admitting: Family Medicine

## 2018-02-06 DIAGNOSIS — F3289 Other specified depressive episodes: Secondary | ICD-10-CM

## 2018-02-06 DIAGNOSIS — I10 Essential (primary) hypertension: Secondary | ICD-10-CM

## 2018-02-06 DIAGNOSIS — E038 Other specified hypothyroidism: Secondary | ICD-10-CM

## 2018-02-26 ENCOUNTER — Other Ambulatory Visit: Payer: Self-pay | Admitting: Family Medicine

## 2018-02-26 DIAGNOSIS — I10 Essential (primary) hypertension: Secondary | ICD-10-CM

## 2018-02-26 DIAGNOSIS — H1045 Other chronic allergic conjunctivitis: Secondary | ICD-10-CM | POA: Diagnosis not present

## 2018-03-13 ENCOUNTER — Ambulatory Visit
Admission: RE | Admit: 2018-03-13 | Discharge: 2018-03-13 | Disposition: A | Discharge status: Home / Self Care | Payer: PPO | Attending: Family Medicine | Admitting: Family Medicine

## 2018-03-13 ENCOUNTER — Encounter: Payer: Self-pay | Admitting: Family Medicine

## 2018-03-13 ENCOUNTER — Ambulatory Visit
Admission: RE | Admit: 2018-03-13 | Discharge: 2018-03-13 | Disposition: A | Discharge status: Home / Self Care | Payer: PPO | Source: Ambulatory Visit | Attending: Family Medicine | Admitting: Family Medicine

## 2018-03-13 ENCOUNTER — Ambulatory Visit (INDEPENDENT_AMBULATORY_CARE_PROVIDER_SITE_OTHER): Payer: PPO | Admitting: Family Medicine

## 2018-03-13 VITALS — BP 118/64 | HR 84 | Temp 97.9°F | Resp 20 | Wt 226.0 lb

## 2018-03-13 DIAGNOSIS — R0602 Shortness of breath: Secondary | ICD-10-CM | POA: Insufficient documentation

## 2018-03-13 DIAGNOSIS — R059 Cough, unspecified: Secondary | ICD-10-CM

## 2018-03-13 DIAGNOSIS — J329 Chronic sinusitis, unspecified: Secondary | ICD-10-CM

## 2018-03-13 DIAGNOSIS — R05 Cough: Secondary | ICD-10-CM

## 2018-03-13 MED ORDER — DOXYCYCLINE HYCLATE 100 MG PO TABS: 100.0000 mg | ORAL_TABLET | Freq: Two times a day (BID) | ORAL | 0 refills | Status: DC

## 2018-03-13 NOTE — Progress Notes (Signed)
Patient: Latoya Cordova Female    DOB: May 07, 1945   72 y.o.   MRN: [id number] Visit Date: 03/13/2018  Today's Provider: Megan Mansichard Gilbert Jr, MD   Chief Complaint  Patient presents with  . Follow-up  . Sinusitis   Subjective:     HPI  Patient comes in today for a follow up. She was last seen in the office 6 months ago. No medications were changed since last visit. She reports that she is stable on all depression medications and doing well emotionally.  She also c/o possible sinus infection x 2 weeks. She has pressure behind her eyes, nasal congestion, ear fullness, and PND. Patient has used eye drops, sudafed, and alka seltzer plus with no relief. She has also noticed she is more short of breath since she has been sick.  She has a nonproductive cough. Allergies  Allergen Reactions  . Sulfa Antibiotics     Per Mother  . Lodine  [Etodolac] Rash    Abdominal rash.     Current Outpatient Medications:  .  buPROPion (WELLBUTRIN XL) 150 MG 24 hr tablet, Take 1 tablet (150 mg total) by mouth daily., Disp: 90 tablet, Rfl: 3 .  butalbital-aspirin-caffeine (FIORINAL) 50-325-40 MG capsule, Take 1 capsule by mouth 2 (two) times daily as needed for headache., Disp: 55 capsule, Rfl: 2 .  butalbital-aspirin-caffeine (FIORINAL) 50-325-40 MG capsule, Take 1 capsule by mouth 2 (two) times daily as needed for headache., Disp: 55 capsule, Rfl: 2 .  hydrochlorothiazide (HYDRODIURIL) 25 MG tablet, TAKE 1 TABLET BY MOUTH ONCE DAILY, Disp: 90 tablet, Rfl: 3 .  HYDROcodone-acetaminophen (NORCO/VICODIN) 5-325 MG tablet, Take 1-2 tablets by mouth every 6 (six) hours as needed for moderate pain., Disp: 55 tablet, Rfl: 0 .  HYDROcodone-acetaminophen (NORCO/VICODIN) 5-325 MG tablet, Take 1-2 tablets by mouth every 6 (six) hours as needed for moderate pain., Disp: 55 tablet, Rfl: 0 .  levothyroxine (SYNTHROID, LEVOTHROID) 25 MCG tablet, TAKE 1 TABLET BY MOUTH ONCE DAILY, Disp: 90 tablet, Rfl: 3 .   LORazepam (ATIVAN) 1 MG tablet, TAKE 1 TABLET BY MOUTH AT BEDTIME IF NEEDED, Disp: 90 tablet, Rfl: 0 .  metoprolol succinate (TOPROL XL) 25 MG 24 hr tablet, Take 1 tablet (25 mg total) by mouth daily., Disp: 90 tablet, Rfl: 4 .  PARoxetine (PAXIL) 40 MG tablet, TAKE 1 TABLET BY MOUTH ONCE DAILY, Disp: 30 tablet, Rfl: 11 .  PARoxetine (PAXIL) 40 MG tablet, TAKE 1 TABLET(40 MG) BY MOUTH DAILY, Disp: 90 tablet, Rfl: 0 .  furosemide (LASIX) 20 MG tablet, Take 1 tablet (20 mg total) by mouth daily as needed., Disp: 30 tablet, Rfl: 3 .  PNEUMOVAX 23 25 MCG/0.5ML injection, inject 0.5 milliliter intramuscularly, Disp: , Rfl: 0 .  potassium chloride (K-DUR) 10 MEQ tablet, Take 1 tablet (10 mEq total) by mouth daily as needed., Disp: 30 tablet, Rfl: 3  Review of Systems  Constitutional: Negative for activity change, appetite change, chills, diaphoresis, fatigue, fever and unexpected weight change.  HENT: Positive for congestion, ear pain, postnasal drip, sinus pressure and sinus pain.   Eyes: Negative.   Respiratory: Positive for shortness of breath and wheezing.   Cardiovascular: Negative for chest pain, palpitations and leg swelling.  Gastrointestinal: Negative.   Endocrine: Negative.   Allergic/Immunologic: Negative.   Neurological: Negative for dizziness, light-headedness and headaches.  Psychiatric/Behavioral: Negative for agitation, decreased concentration, dysphoric mood, self-injury, sleep disturbance and suicidal ideas. The patient is not nervous/anxious.     Social  History   Tobacco Use  . Smoking status: Never Smoker  . Smokeless tobacco: Never Used  Substance Use Topics  . Alcohol use: No      Objective:   BP 118/64 (BP Location: Left Arm, Patient Position: Sitting, Cuff Size: Large)   Pulse 84   Temp 97.9 F (36.6 C)   Resp 20   Wt 226 lb (102.5 kg)   SpO2 93%   BMI 38.79 kg/m  Vitals:   03/13/18 1448  BP: 118/64  Pulse: 84  Resp: 20  Temp: 97.9 F (36.6 C)  SpO2:  93%  Weight: 226 lb (102.5 kg)     Physical Exam Constitutional:      Appearance: Normal appearance. She is well-developed.     Comments: Mild obesity.  HENT:     Head: Normocephalic and atraumatic.     Right Ear: Tympanic membrane and external ear normal.     Left Ear: Tympanic membrane and external ear normal.     Nose: Nose normal.     Mouth/Throat:     Pharynx: Oropharynx is clear.  Eyes:     General: No scleral icterus.    Conjunctiva/sclera: Conjunctivae normal.     Pupils: Pupils are equal, round, and reactive to light.  Neck:     Musculoskeletal: Normal range of motion and neck supple.     Thyroid: No thyromegaly.  Cardiovascular:     Rate and Rhythm: Normal rate and regular rhythm.     Heart sounds: Normal heart sounds.  Pulmonary:     Effort: Pulmonary effort is normal.     Breath sounds: Normal breath sounds.  Abdominal:     General: Bowel sounds are normal.     Palpations: Abdomen is soft.  Musculoskeletal: Normal range of motion.  Skin:    General: Skin is warm and dry.  Neurological:     Mental Status: She is alert and oriented to person, place, and time.     Deep Tendon Reflexes: Reflexes are normal and symmetric.  Psychiatric:        Behavior: Behavior normal.        Thought Content: Thought content normal.        Judgment: Judgment normal.         Assessment & Plan    1. Shortness of breath Does not seem to be CHF but will work up and follow up due to weight gain and mild hypoxia. - EKG 12-Lead - Comprehensive metabolic panel - TSH - Pro b natriuretic peptide (BNP)[facility]) - DG Chest 2 View; Future  2. Cough  - DG Chest 2 View; Future  3. Sinusitis, unspecified chronicity, unspecified location  - CBC with Differential/Platelet - doxycycline (VIBRA-TABS) 100 MG tablet; Take 1 tablet (100 mg total) by mouth 2 (two) times daily.  Dispense: 20 tablet; Refill: 0 4.Obesity   I have done the exam and reviewed the  above chart and it is accurate to the best of my knowledge. Dentist has been used in this note in any air is in the dictation or transcription are unintentional.  Megan Mans, MD  [facility]

## 2018-03-14 LAB — COMPREHENSIVE METABOLIC PANEL
ALBUMIN: 4.3 g/dL (ref 3.5–4.8)
ALT: 44 IU/L — ABNORMAL HIGH (ref 0–32)
AST: 80 IU/L — ABNORMAL HIGH (ref 0–40)
Albumin/Globulin Ratio: 1.6 (ref 1.2–2.2)
Alkaline Phosphatase: 58 IU/L (ref 39–117)
BUN/Creatinine Ratio: 20 (ref 12–28)
BUN: 19 mg/dL (ref 8–27)
Bilirubin Total: 0.3 mg/dL (ref 0.0–1.2)
CALCIUM: 9.4 mg/dL (ref 8.7–10.3)
CO2: 24 mmol/L (ref 20–29)
Chloride: 100 mmol/L (ref 96–106)
Creatinine, Ser: 0.96 mg/dL (ref 0.57–1.00)
GFR calc Af Amer: 68 mL/min/{1.73_m2} (ref >59)
GFR, EST NON AFRICAN AMERICAN: 59 mL/min/{1.73_m2} — AB (ref >59)
Globulin, Total: 2.7 g/dL (ref 1.5–4.5)
Glucose: 84 mg/dL (ref 65–99)
Potassium: 3.9 mmol/L (ref 3.5–5.2)
Sodium: 140 mmol/L (ref 134–144)
TOTAL PROTEIN: 7 g/dL (ref 6.0–8.5)

## 2018-03-14 LAB — TSH: TSH: 2.91 u[IU]/mL (ref 0.450–4.500)

## 2018-03-14 LAB — CBC WITH DIFFERENTIAL/PLATELET
BASOS: 1 %
Basophils Absolute: 0.1 10*3/uL (ref 0.0–0.2)
EOS (ABSOLUTE): 0.3 10*3/uL (ref 0.0–0.4)
Eos: 3 %
HEMOGLOBIN: 14.8 g/dL (ref 11.1–15.9)
Hematocrit: 43.5 % (ref 34.0–46.6)
IMMATURE GRANS (ABS): 0 10*3/uL (ref 0.0–0.1)
IMMATURE GRANULOCYTES: 0 %
LYMPHS: 34 %
Lymphocytes Absolute: 3.4 10*3/uL — ABNORMAL HIGH (ref 0.7–3.1)
MCH: 32.1 pg (ref 26.6–33.0)
MCHC: 34 g/dL (ref 31.5–35.7)
MCV: 94 fL (ref 79–97)
MONOCYTES: 8 %
Monocytes Absolute: 0.8 10*3/uL (ref 0.1–0.9)
NEUTROS PCT: 54 %
Neutrophils Absolute: 5.5 10*3/uL (ref 1.4–7.0)
PLATELETS: 202 10*3/uL (ref 150–450)
RBC: 4.61 x10E6/uL (ref 3.77–5.28)
RDW: 13.1 % (ref 12.3–15.4)
WBC: 10.1 10*3/uL (ref 3.4–10.8)

## 2018-03-14 LAB — PRO B NATRIURETIC PEPTIDE: NT-Pro BNP: 133 pg/mL (ref 0–301)

## 2018-03-15 ENCOUNTER — Telehealth: Payer: Self-pay

## 2018-03-15 MED ORDER — FUROSEMIDE 20 MG PO TABS: 20.0000 mg | ORAL_TABLET | Freq: Every morning | ORAL | 3 refills | Status: DC

## 2018-03-15 NOTE — Telephone Encounter (Signed)
Patient advised and prescription has been  E-scribed, she states that she has an appt scheduled on 04/04/17 and will discuss with Dr. Sullivan LoneGilbert about report. KW

## 2018-03-15 NOTE — Telephone Encounter (Signed)
-----   Message from Maple Hudsonichard L Gilbert Jr., MD sent at 03/14/2018  1:32 PM EST ----- Lets try Lasix 20mg  q am and recheck 2 weeks.

## 2018-03-28 ENCOUNTER — Other Ambulatory Visit: Payer: Self-pay | Admitting: Family Medicine

## 2018-03-28 DIAGNOSIS — F3289 Other specified depressive episodes: Secondary | ICD-10-CM

## 2018-04-04 ENCOUNTER — Ambulatory Visit (INDEPENDENT_AMBULATORY_CARE_PROVIDER_SITE_OTHER): Payer: PPO | Admitting: Family Medicine

## 2018-04-04 VITALS — BP 124/80 | HR 100 | Temp 97.6°F | Resp 18 | Wt 223.0 lb

## 2018-04-04 DIAGNOSIS — M1990 Unspecified osteoarthritis, unspecified site: Secondary | ICD-10-CM | POA: Diagnosis not present

## 2018-04-04 DIAGNOSIS — Z6838 Body mass index (BMI) 38.0-38.9, adult: Secondary | ICD-10-CM | POA: Diagnosis not present

## 2018-04-04 DIAGNOSIS — I10 Essential (primary) hypertension: Secondary | ICD-10-CM

## 2018-04-04 DIAGNOSIS — R0600 Dyspnea, unspecified: Secondary | ICD-10-CM

## 2018-04-04 DIAGNOSIS — Z23 Encounter for immunization: Secondary | ICD-10-CM

## 2018-04-04 MED ORDER — HYDROCODONE-ACETAMINOPHEN 5-325 MG PO TABS: 1.0000 | ORAL_TABLET | Freq: Four times a day (QID) | ORAL | 0 refills | Status: DC | PRN

## 2018-04-04 NOTE — Progress Notes (Signed)
Latoya BenderSylvia K Cordova  MRN: [id number] DOB: [birth year]  Subjective:  HPI   The patient is a 73 year old female who presents for follow up on shortness of breath.  She was last seen on 03/13/18.  At that time it is noted that she did not appear to have CHF but work up was started.  She was placed on Doxycycline at the time for symptoms of sinusitis.  Her BNP on that date was normal at 133.  Patient reports that the sinus symptoms are completely gone.  She reports that the shortness of breath is a little bit better.  Patient Active Problem List   Diagnosis Date Noted  . Chronic diastolic CHF (congestive heart failure) (HCC) 05/30/2016  . Sinus tachycardia 06/17/2015  . Diastolic dysfunction   . Hypertension   . Chest pain 04/08/2015  . Pneumonia 04/08/2015  . SOB (shortness of breath)   . Hypoxia   . Morbid obesity due to excess calories (HCC)   . Fibromyalgia   . Allergic rhinitis 01/12/2015  . Clinical depression 01/12/2015  . Anxiety, generalized 01/12/2015  . Fibrositis 01/12/2015  . Acid reflux 01/12/2015  . Need for prophylactic hormone replacement therapy (postmenopausal) 01/12/2015  . HLD (hyperlipidemia) 01/12/2015  . Adult hypothyroidism 01/12/2015  . Arthritis, degenerative 01/12/2015  . Cardiac murmur 01/12/2015  . Osteopenia 01/12/2015  . Adiposity 01/12/2015    Past Medical History:  Diagnosis Date  . Anxiety   . Diastolic dysfunction    a. echo 08/2014: EF 50-55%, mild MR; b. echo 03/2015: EF 60-65%, RWMA could not be excluded, GR1DD, ascending aorta mildly dilated at 37mm, normal right-sided pressure  . Hypertension   . Morbid obesity (HCC)     Social History   Socioeconomic History  . Marital status: Widowed    Spouse name: Not on file  . Number of children: 1  . Years of education: Not on file  . Highest education level: Associate degree: occupational, Scientist, product/process developmenttechnical, or vocational program  Occupational History  . Occupation: retired  Engineer, productionocial Needs  .  Financial resource strain: Not hard at all  . Food insecurity:    Worry: Never true    Inability: Never true  . Transportation needs:    Medical: No    Non-medical: No  Tobacco Use  . Smoking status: Never Smoker  . Smokeless tobacco: Never Used  Substance and Sexual Activity  . Alcohol use: No  . Drug use: No  . Sexual activity: Not on file  Lifestyle  . Physical activity:    Days per week: Not on file    Minutes per session: Not on file  . Stress: To some extent  Relationships  . Social connections:    Talks on phone: Not on file    Gets together: Not on file    Attends religious service: Not on file    Active member of club or organization: Not on file    Attends meetings of clubs or organizations: Not on file    Relationship status: Not on file  . Intimate partner violence:    Fear of current or ex partner: Not on file    Emotionally abused: Not on file    Physically abused: Not on file    Forced sexual activity: Not on file  Other Topics Concern  . Not on file  Social History Narrative  . Not on file    Outpatient Encounter Medications as of 04/04/2018  Medication Sig  . ALPRAZolam (XANAX) 0.5 MG tablet  Take 1 tablet (0.5 mg total) by mouth at bedtime as needed for anxiety.  Marland Kitchen. buPROPion (WELLBUTRIN XL) 150 MG 24 hr tablet Take 1 tablet (150 mg total) by mouth daily.  . butalbital-aspirin-caffeine (FIORINAL) 50-325-40 MG capsule Take 1 capsule by mouth 2 (two) times daily as needed for headache.  . butalbital-aspirin-caffeine (FIORINAL) 50-325-40 MG capsule Take 1 capsule by mouth 2 (two) times daily as needed for headache.  . furosemide (LASIX) 20 MG tablet Take 1 tablet (20 mg total) by mouth every morning.  . hydrochlorothiazide (HYDRODIURIL) 25 MG tablet TAKE 1 TABLET BY MOUTH ONCE DAILY  . HYDROcodone-acetaminophen (NORCO/VICODIN) 5-325 MG tablet Take 1-2 tablets by mouth every 6 (six) hours as needed for moderate pain.  Marland Kitchen. HYDROcodone-acetaminophen  (NORCO/VICODIN) 5-325 MG tablet Take 1-2 tablets by mouth every 6 (six) hours as needed for moderate pain.  Marland Kitchen. levothyroxine (SYNTHROID, LEVOTHROID) 25 MCG tablet TAKE 1 TABLET BY MOUTH ONCE DAILY  . metoprolol succinate (TOPROL XL) 25 MG 24 hr tablet Take 1 tablet (25 mg total) by mouth daily.  Marland Kitchen. PARoxetine (PAXIL) 40 MG tablet TAKE 1 TABLET BY MOUTH ONCE DAILY  . PARoxetine (PAXIL) 40 MG tablet TAKE 1 TABLET(40 MG) BY MOUTH DAILY  . PNEUMOVAX 23 25 MCG/0.5ML injection inject 0.5 milliliter intramuscularly  . potassium chloride (K-DUR) 10 MEQ tablet Take 1 tablet (10 mEq total) by mouth daily as needed.  . [DISCONTINUED] doxycycline (VIBRA-TABS) 100 MG tablet Take 1 tablet (100 mg total) by mouth 2 (two) times daily.   No facility-administered encounter medications on file as of 04/04/2018.     Allergies  Allergen Reactions  . Sulfa Antibiotics     Per Mother  . Lodine  [Etodolac] Rash    Abdominal rash.    Review of Systems  Constitutional: Positive for malaise/fatigue. Negative for fever.  HENT: Negative.   Eyes: Negative.   Respiratory: Positive for shortness of breath and wheezing (cleared since taking the antibiotic). Negative for cough.   Cardiovascular: Positive for leg swelling (ankles). Negative for chest pain, palpitations, orthopnea and claudication.  Gastrointestinal: Negative.   Musculoskeletal: Positive for joint pain.  Skin: Negative.   Neurological: Negative.   Endo/Heme/Allergies: Negative.   Psychiatric/Behavioral: Negative.     Objective:  BP 124/80 (BP Location: Right Arm, Patient Position: Sitting, Cuff Size: Normal)   Pulse 100   Temp 97.6 F (36.4 C) (Oral)   Resp 18   Wt 223 lb (101.2 kg)   SpO2 94%   BMI 38.28 kg/m   Physical Exam  Assessment and Plan :   1. Osteoarthritis, unspecified osteoarthritis type, unspecified site  - HYDROcodone-acetaminophen (NORCO/VICODIN) 5-325 MG tablet; Take 1-2 tablets by mouth every 6 (six) hours as needed for  moderate pain.  Dispense: 55 tablet; Refill: 0  2. Need for influenza vaccination  - Flu vaccine HIGH DOSE PF (Fluzone High dose)  3. Essential hypertension   4. Class 2 severe obesity due to excess calories with serious comorbidity and body mass index (BMI) of 38.0 to 38.9 in adult (HCC)    5. Dyspnea, unspecified type Resolved with sinusitis treatment. 6.Sinusitis Resolved. 7.Chronic CHF Every other day lasix now--follow clinically.RTC 2 months.  HPI, Exam and A&P Transcribed under the direction and in the presence of Julieanne Mansonichard Gilbert, Montez HagemanJr., MD. Electronically Signed: Janey GreaserElena DeSanto, RMA I have done the exam and reviewed the chart and it is accurate to the best of my knowledge. DentistDragon  technology has been used and  any errors in dictation or  transcription are unintentional. Miguel Aschoff M.D. [facility]

## 2018-04-04 NOTE — Patient Instructions (Signed)
Furosemide every other day.

## 2018-05-07 ENCOUNTER — Other Ambulatory Visit: Payer: Self-pay | Admitting: Family Medicine

## 2018-05-07 DIAGNOSIS — I10 Essential (primary) hypertension: Secondary | ICD-10-CM

## 2018-05-28 ENCOUNTER — Other Ambulatory Visit: Payer: Self-pay | Admitting: Family Medicine

## 2018-05-28 DIAGNOSIS — F3289 Other specified depressive episodes: Secondary | ICD-10-CM

## 2018-05-28 NOTE — Telephone Encounter (Signed)
Pharmacy requesting refills. Thanks!  

## 2018-06-06 ENCOUNTER — Ambulatory Visit: Payer: Self-pay | Admitting: Family Medicine

## 2018-07-11 ENCOUNTER — Ambulatory Visit: Payer: Self-pay

## 2018-07-23 ENCOUNTER — Other Ambulatory Visit: Payer: Self-pay | Admitting: Family Medicine

## 2018-09-04 ENCOUNTER — Telehealth: Payer: Self-pay

## 2018-09-04 NOTE — Telephone Encounter (Signed)
Virtual Visit Pre-Appointment Phone Call  "Latoya Cordova, I am calling you today to discuss your upcoming appointment. We are currently trying to limit exposure to the virus that causes COVID-19 by seeing patients at home rather than in the office."  1. "What is the BEST phone number to call the day of the visit?" - include this in appointment notes  2. Do you have or have access to (through a family member/friend) a smartphone with video capability that we can use for your visit?" a. If yes - list this number in appt notes as cell (if different from BEST phone #) and list the appointment type as a VIDEO visit in appointment notes b. If no - list the appointment type as a PHONE visit in appointment notes  3. Confirm consent - "In the setting of the current Covid19 crisis, you are scheduled for a phone visit with your provider on 09/10/2018 at 2:00PM.  Just as we do with many in-office visits, in order for you to participate in this visit, we must obtain consent.  If you'd like, I can send this to your mychart (if signed up) or email for you to review.  Otherwise, I can obtain your verbal consent now.  All virtual visits are billed to your insurance company just like a normal visit would be.  By agreeing to a virtual visit, we'd like you to understand that the technology does not allow for your provider to perform an examination, and thus may limit your provider's ability to fully assess your condition. If your provider identifies any concerns that need to be evaluated in person, we will make arrangements to do so.  Finally, though the technology is pretty good, we cannot assure that it will always work on either your or our end, and in the setting of a video visit, we may have to convert it to a phone-only visit.  In either situation, we cannot ensure that we have a secure connection.  Are you willing to proceed?" STAFF: Did the patient verbally acknowledge consent to telehealth visit? Document YES/NO  here: YES  4. Advise patient to be prepared - "Two hours prior to your appointment, go ahead and check your blood pressure, pulse, oxygen saturation, and your weight (if you have the equipment to check those) and write them all down. When your visit starts, your provider will ask you for this information. If you have an Apple Watch or Kardia device, please plan to have heart rate information ready on the day of your appointment. Please have a pen and paper handy nearby the day of the visit as well."  5. Give patient instructions for MyChart download to smartphone OR Doximity/[email or web address].me as below if video visit (depending on what platform provider is using)  6. Inform patient they will receive a phone call 15 minutes prior to their appointment time (may be from unknown caller ID) so they should be prepared to answer    TELEPHONE CALL NOTE  Latoya Cordova has been deemed a candidate for a follow-up tele-health visit to limit community exposure during the Covid-19 pandemic. I spoke with the patient via phone to ensure availability of phone/video source, confirm preferred email & phone number, and discuss instructions and expectations.  I reminded Latoya Cordova to be prepared with any vital sign and/or heart rhythm information that could potentially be obtained via home monitoring, at the time of her visit. I reminded Latoya Cordova to expect a phone call prior to her visit.  Horton Finer 09/04/2018 10:33 AM    FULL LENGTH CONSENT FOR TELE-HEALTH VISIT   I hereby voluntarily request, consent and authorize [facility] and its employed or contracted physicians, physician assistants, nurse practitioners or other licensed health care professionals (the Practitioner), to provide me with telemedicine health care services (the Services") as deemed necessary by the treating Practitioner. I acknowledge and consent to receive the Services by the Practitioner via telemedicine. I understand  that the telemedicine visit will involve communicating with the Practitioner through live audiovisual communication technology and the disclosure of certain medical information by electronic transmission. I acknowledge that I have been given the opportunity to request an in-person assessment or other available alternative prior to the telemedicine visit and am voluntarily participating in the telemedicine visit.  I understand that I have the right to withhold or withdraw my consent to the use of telemedicine in the course of my care at any time, without affecting my right to future care or treatment, and that the Practitioner or I may terminate the telemedicine visit at any time. I understand that I have the right to inspect all information obtained and/or recorded in the course of the telemedicine visit and may receive copies of available information for a reasonable fee.  I understand that some of the potential risks of receiving the Services via telemedicine include:   Delay or interruption in medical evaluation due to technological equipment failure or disruption;  Information transmitted may not be sufficient (e.g. poor resolution of images) to allow for appropriate medical decision making by the Practitioner; and/or   In rare instances, security protocols could fail, causing a breach of personal health information.  Furthermore, I acknowledge that it is my responsibility to provide information about my medical history, conditions and care that is complete and accurate to the best of my ability. I acknowledge that Practitioner's advice, recommendations, and/or decision may be based on factors not within their control, such as incomplete or inaccurate data provided by me or distortions of diagnostic images or specimens that may result from electronic transmissions. I understand that the practice of medicine is not an exact science and that Practitioner makes no warranties or guarantees regarding  treatment outcomes. I acknowledge that I will receive a copy of this consent concurrently upon execution via email to the email address I last provided but may also request a printed copy by calling the office of [facility].    I understand that my insurance will be billed for this visit.   I have read or had this consent read to me.  I understand the contents of this consent, which adequately explains the benefits and risks of the Services being provided via telemedicine.   I have been provided ample opportunity to ask questions regarding this consent and the Services and have had my questions answered to my satisfaction.  I give my informed consent for the services to be provided through the use of telemedicine in my medical care  By participating in this telemedicine visit I agree to the above.

## 2018-09-05 NOTE — Progress Notes (Deleted)
{Choose 1 Note Type (Telehealth Visit or Telephone Visit):[phone]}   Date:  09/05/2018   ID:  Latoya BenderSylvia K Cordova, DOB 1946-03-10, MRN [id number]  {Patient Location:[phone]::"Home"} Provider Location: Office  PCP:  Maple HudsonGilbert, Richard L Jr., MD  Cardiologist:  Julien Nordmannimothy Gollan, MD  Electrophysiologist:  None   Evaluation Performed:  Follow-Up Visit  Chief Complaint:  Follow up ***  History of Present Illness:    Latoya BenderSylvia K Cordova is a 73 y.o. female with history of HFpEF, HTN, obesity, and anxiety who presents for follow up of diastolic CHF.   Prior admission in 03/2015 for SOB with echo at that time showing an EF of 60-65%, unable to exclude RWMA, Gr1DD, mildly dilated ascending aorta measuring 37 mm, normal right sided pressure. Prior CT chest in 03/2015 showed no significant coronary artery calcifications . CTA chest was negative for PE. She was most recently seen by Dr. Mariah MillingGollan in 07/2017 for routine follow up and continued to note high fluid intake with swelling in her hands, legs, and abdomen. She was taking Lasix 2-3 times per week. Her weight was documented at 213 pounds, which compared to her visit in 2018 was up by 4 pounds. It was noted she had been out of metoprolol and was mildly tachycardic. She was seen by her PCP in 02/2018 with sinusitis/URI symptoms with BNP being 133 at that time. Labs also showed a normal TSH, SCr 0.96, K+ 3.9, AST 80, ALT 44, HGB 14.8. CXR at that time showed pulmonary vascular congestion. Following that, she was advised to take Lasix 20 mg daily x 2 weeks. In follow up with her PCP in 03/2018 her SOB was a little better. She was advised to take Lasix 20 mg every other day.   ***  The patient {does/does not:200015} have symptoms concerning for COVID-19 infection (fever, chills, cough, or new shortness of breath).    Past Medical History:  Diagnosis Date  . Anxiety   . Diastolic dysfunction    a. echo 08/2014: EF 50-55%, mild MR; b. echo 03/2015: EF 60-65%, RWMA  could not be excluded, GR1DD, ascending aorta mildly dilated at 37mm, normal right-sided pressure  . Hypertension   . Morbid obesity (HCC)    Past Surgical History:  Procedure Laterality Date  . ABDOMINAL HYSTERECTOMY  08/18/2004  . APPENDECTOMY       No outpatient medications have been marked as taking for the 09/10/18 encounter (Appointment) with Sondra Bargesunn, Ledia Hanford M, PA-C.     Allergies:   Sulfa antibiotics and Lodine  [etodolac]   Social History   Tobacco Use  . Smoking status: Never Smoker  . Smokeless tobacco: Never Used  Substance Use Topics  . Alcohol use: No  . Drug use: No     Family Hx: The patient's family history includes Breast cancer in her cousin and maternal aunt; Congestive Heart Failure in her mother; Heart attack in her paternal grandfather; Hypertension in her mother; Ulcers in her father.  ROS:   Please see the history of present illness.     All other systems reviewed and are negative.   Prior CV studies:   The following studies were reviewed today:  2D Echo 03/2015: - Left ventricle: The cavity size was normal. There was mild focal   basal and moderate concentric hypertrophy of the septum. There is   LVOT gradient of 41 mm Hg with valsalva (not well visualized)   Systolic function was normal. The estimated ejection fraction was   in the range of 60% to  65%. Regional wall motion abnormalities   cannot be excluded. Doppler parameters are consistent with   abnormal left ventricular relaxation (grade 1 diastolic   dysfunction). - Aorta: Ascending aorta is mildly dilated, diameter: 37 mm (S). - Left atrium: The atrium was normal in size. - Right ventricle: Systolic function was normal. - Pulmonary arteries: Systolic pressure was within the normal   range.  Impressions:  - Challenging image quality.  Labs/Other Tests and Data Reviewed:    EKG:  {EKG/Telemetry Strips Reviewed:[phone]}  Recent Labs: 03/13/2018: ALT 44; BUN 19; Creatinine, Ser  0.96; Hemoglobin 14.8; NT-Pro BNP 133; Platelets 202; Potassium 3.9; Sodium 140; TSH 2.910   Recent Lipid Panel Lab Results  Component Value Date/Time   CHOL 194 02/19/2017 02:45 PM   CHOL 196 01/19/2015 10:21 AM   TRIG 222 (H) 02/19/2017 02:45 PM   HDL 51 02/19/2017 02:45 PM   HDL 44 01/19/2015 10:21 AM   CHOLHDL 3.8 02/19/2017 02:45 PM   LDLCALC 109 (H) 02/19/2017 02:45 PM    Wt Readings from Last 3 Encounters:  04/04/18 223 lb (101.2 kg)  03/13/18 226 lb (102.5 kg)  09/12/17 212 lb 3.2 oz (96.3 kg)     Objective:    Vital Signs:  There were no vitals taken for this visit.   {[facility] Virtual Exam (Optional):[phone]::"VITAL SIGNS:  reviewed"}  ASSESSMENT & PLAN:    1. ***  COVID-19 Education: The signs and symptoms of COVID-19 were discussed with the patient and how to seek care for testing (follow up with PCP or arrange E-visit).  The importance of social distancing was discussed today.  Time:   Today, I have spent *** minutes with the patient with telehealth technology discussing the above problems.     Medication Adjustments/Labs and Tests Ordered: Current medicines are reviewed at length with the patient today.  Concerns regarding medicines are outlined above.   Tests Ordered: No orders of the defined types were placed in this encounter.   Medication Changes: No orders of the defined types were placed in this encounter.   Disposition:  Follow up {follow up:15908}  Signed, Christell Faith, PA-C  09/05/2018 3:11 PM    [facility]

## 2018-09-10 ENCOUNTER — Telehealth (INDEPENDENT_AMBULATORY_CARE_PROVIDER_SITE_OTHER): Payer: PPO | Admitting: Physician Assistant

## 2018-09-10 ENCOUNTER — Other Ambulatory Visit: Payer: Self-pay

## 2018-09-10 DIAGNOSIS — Z5329 Procedure and treatment not carried out because of patient's decision for other reasons: Secondary | ICD-10-CM

## 2018-09-10 NOTE — Progress Notes (Signed)
Patient was a no-show for a virtual visit.

## 2018-09-13 ENCOUNTER — Other Ambulatory Visit: Payer: Self-pay | Admitting: Family Medicine

## 2018-09-13 DIAGNOSIS — F3289 Other specified depressive episodes: Secondary | ICD-10-CM

## 2018-10-03 ENCOUNTER — Other Ambulatory Visit: Payer: Self-pay

## 2018-10-03 ENCOUNTER — Ambulatory Visit (INDEPENDENT_AMBULATORY_CARE_PROVIDER_SITE_OTHER): Payer: PPO

## 2018-10-03 DIAGNOSIS — Z0000 Encounter for general adult medical examination without abnormal findings: Secondary | ICD-10-CM

## 2018-10-03 NOTE — Progress Notes (Addendum)
Subjective:   Latoya BenderSylvia K Cordova is a 73 y.o. female who presents for Medicare Annual (Subsequent) preventive examination.    This visit is being conducted through telemedicine due to the COVID-19 pandemic. This patient has given me verbal consent via doximity to conduct this visit, patient states they are participating from their home address. Some vital signs may be absent or patient reported.    Patient identification: identified by name, DOB, and current address  Review of Systems:  N/A  Cardiac Risk Factors include: obesity (BMI >30kg/m2);advanced age (>5655men, 68>65 women);dyslipidemia;hypertension     Objective:     Vitals: There were no vitals taken for this visit.  There is no height or weight on file to calculate BMI. Unable to obtain vitals due to visit being conducted via telephonically.   Advanced Directives 10/03/2018 07/09/2017 04/08/2015  Does Patient Have a Medical Advance Directive? Yes Yes No  Type of Estate agentAdvance Directive Healthcare Power of [place];Living will Healthcare Power of [place];Living will -  Copy of Healthcare Power of Attorney in Chart? No - copy requested No - copy requested -  Would patient like information on creating a medical advance directive? - - No - patient declined information    Tobacco Social History   Tobacco Use  Smoking Status Never Smoker  Smokeless Tobacco Never Used     Counseling given: Not Answered   Clinical Intake:  Pre-visit preparation completed: Yes  Pain : No/denies pain Pain Score: 0-No pain     Nutritional Risks: None Diabetes: No  How often do you need to have someone help you when you read instructions, pamphlets, or other written materials from your doctor or pharmacy?: 1 - Never  Interpreter Needed?: No  Information entered by :: [facility] HospitalMmarkoski, LPN  Past Medical History:  Diagnosis Date  . Anxiety   . Diastolic dysfunction    a. echo 08/2014: EF 50-55%, mild MR; b. echo 03/2015: EF 60-65%, RWMA could not be  excluded, GR1DD, ascending aorta mildly dilated at 37mm, normal right-sided pressure  . Hyperlipidemia   . Hypertension   . Morbid obesity (HCC)    Past Surgical History:  Procedure Laterality Date  . ABDOMINAL HYSTERECTOMY  08/18/2004  . APPENDECTOMY     Family History  Problem Relation Age of Onset  . Ulcers Father   . Heart attack Paternal Grandfather   . Hypertension Mother   . Congestive Heart Failure Mother   . Breast cancer Maternal Aunt   . Breast cancer Cousin    Social History   Socioeconomic History  . Marital status: Widowed    Spouse name: Not on file  . Number of children: 1  . Years of education: Not on file  . Highest education level: Associate degree: occupational, Scientist, product/process developmenttechnical, or vocational program  Occupational History  . Occupation: retired  Engineer, productionocial Needs  . Financial resource strain: Not hard at all  . Food insecurity    Worry: Never true    Inability: Never true  . Transportation needs    Medical: No    Non-medical: No  Tobacco Use  . Smoking status: Never Smoker  . Smokeless tobacco: Never Used  Substance and Sexual Activity  . Alcohol use: No  . Drug use: No  . Sexual activity: Not on file  Lifestyle  . Physical activity    Days per week: 0 days    Minutes per session: 0 min  . Stress: To some extent  Relationships  . Social Musicianconnections    Talks on  phone: Patient refused    Gets together: Patient refused    Attends religious service: Patient refused    Active member of club or organization: Patient refused    Attends meetings of clubs or organizations: Patient refused    Relationship status: Patient refused  Other Topics Concern  . Not on file  Social History Narrative  . Not on file    Outpatient Encounter Medications as of 10/03/2018  Medication Sig  . ALPRAZolam (XANAX) 0.5 MG tablet Take 1 tablet (0.5 mg total) by mouth at bedtime as needed for anxiety.  Marland Kitchen. buPROPion (WELLBUTRIN XL) 150 MG 24 hr tablet TAKE 1 TABLET(150 MG) BY  MOUTH DAILY  . butalbital-aspirin-caffeine (FIORINAL) 50-325-40 MG capsule Take 1 capsule by mouth 2 (two) times daily as needed for headache.  . furosemide (LASIX) 20 MG tablet TAKE 1 TABLET(20 MG) BY MOUTH EVERY MORNING  . hydrochlorothiazide (HYDRODIURIL) 25 MG tablet TAKE 1 TABLET BY MOUTH ONCE DAILY  . HYDROcodone-acetaminophen (NORCO/VICODIN) 5-325 MG tablet Take 1-2 tablets by mouth every 6 (six) hours as needed for moderate pain.  Marland Kitchen. levothyroxine (SYNTHROID, LEVOTHROID) 25 MCG tablet TAKE 1 TABLET BY MOUTH ONCE DAILY  . PARoxetine (PAXIL) 40 MG tablet TAKE 1 TABLET BY MOUTH ONCE DAILY  . butalbital-aspirin-caffeine (FIORINAL) 50-325-40 MG capsule Take 1 capsule by mouth 2 (two) times daily as needed for headache. (Patient not taking: Reported on 10/03/2018)  . HYDROcodone-acetaminophen (NORCO/VICODIN) 5-325 MG tablet Take 1-2 tablets by mouth every 6 (six) hours as needed for moderate pain. (Patient not taking: Reported on 10/03/2018)  . metoprolol succinate (TOPROL XL) 25 MG 24 hr tablet Take 1 tablet (25 mg total) by mouth daily. (Patient not taking: Reported on 10/03/2018)  . PARoxetine (PAXIL) 40 MG tablet TAKE 1 TABLET(40 MG) BY MOUTH DAILY (Patient not taking: Reported on 10/03/2018)  . PNEUMOVAX 23 25 MCG/0.5ML injection inject 0.5 milliliter intramuscularly  . potassium chloride (K-DUR) 10 MEQ tablet Take 1 tablet (10 mEq total) by mouth daily as needed. (Patient not taking: Reported on 10/03/2018)   No facility-administered encounter medications on file as of 10/03/2018.     Activities of Daily Living In your present state of health, do you have any difficulty performing the following activities: 10/03/2018  Hearing? N  Vision? N  Comment Wears eye glasses.  Difficulty concentrating or making decisions? N  Walking or climbing stairs? N  Dressing or bathing? N  Doing errands, shopping? N  Preparing Food and eating ? N  Using the Toilet? N  In the past six months, have you accidently  leaked urine? Y  Comment Occasionally with pressure. Wears protection.  Do you have problems with loss of bowel control? N  Managing your Medications? N  Managing your Finances? N  Housekeeping or managing your Housekeeping? N  Some recent data might be hidden    Patient Care Team: Maple HudsonGilbert, Richard L Jr., MD as PCP - General (Family Medicine) Mariah MillingGollan, Tollie Pizzaimothy J, MD as PCP - Cardiology (Cardiology)    Assessment:   This is a routine wellness examination for Latoya ElmSylvia.  Exercise Activities and Dietary recommendations Current Exercise Habits: The patient does not participate in regular exercise at present, Exercise limited by: orthopedic condition(s)  Goals    . Exercise 3x per week (30 min per time)     Recommend to start exercising 3 days a week for at least 30 minutes at a time.        Fall Risk: Fall Risk  10/03/2018 07/09/2017 02/19/2017 08/31/2015  04/14/2015  Falls in the past year? 0 No No No No    FALL RISK PREVENTION PERTAINING TO THE HOME:  Any stairs in or around the home? Yes  If so, are there any without handrails? No   Home free of loose throw rugs in walkways, pet beds, electrical cords, etc? Yes  Adequate lighting in your home to reduce risk of falls? Yes   ASSISTIVE DEVICES UTILIZED TO PREVENT FALLS:  Life alert? No  Use of a cane, walker or w/c? No  Grab bars in the bathroom? No  Shower chair or bench in shower? No  Elevated toilet seat or a handicapped toilet? Yes    TIMED UP AND GO:  Was the test performed? No .    Depression Screen PHQ 2/9 Scores 10/03/2018 07/09/2017 02/19/2017 01/17/2017  PHQ - 2 Score 1 2 1 3   PHQ- 9 Score - 8 4 14      Cognitive Function:      6CIT Screen 10/03/2018  What Year? 0 points  What month? 0 points  What time? 0 points  Count back from 20 0 points  Months in reverse 0 points  Repeat phrase 0 points  Total Score 0    Immunization History  Administered Date(s) Administered  . Influenza, High Dose Seasonal PF  01/17/2017, 04/04/2018  . Influenza-Unspecified 02/24/2013  . Pneumococcal Conjugate-13 08/08/2013  . Pneumococcal Polysaccharide-23 05/24/2011, 01/20/2017  . Tdap 12/13/2006    Qualifies for Shingles Vaccine? Yes . Due for Shingrix. Education has been provided regarding the importance of this vaccine. Pt has been advised to call insurance company to determine out of pocket expense. Advised may also receive vaccine at local pharmacy or Health Dept. Verbalized acceptance and understanding.  Tdap: Although this vaccine is not a covered service during a Wellness Exam, does the patient still wish to receive this vaccine today?  No .   Flu Vaccine: Up to date  Pneumococcal Vaccine: Completed series  Screening Tests Health Maintenance  Topic Date Due  . Hepatitis C Screening  [birth year]  . TETANUS/TDAP  12/12/2016  . MAMMOGRAM  07/06/2017  . INFLUENZA VACCINE  10/26/2018  . COLONOSCOPY  08/27/2023  . DEXA SCAN  Completed  . PNA vac Low Risk Adult  Completed    Cancer Screenings:  Colorectal Screening: Completed 08/26/13. Repeat every 10 years.  Mammogram: Completed 07/07/15. Ordered today. Pt provided with contact info and advised to call to schedule appt. Pt aware the office will call re: appt.  Bone Density: Completed 03/12/09. Results reflect NORMAL. No repeat needed.   Lung Cancer Screening: (Low Dose CT Chest recommended if Age 83-80 years, 30 pack-year currently smoking OR have quit w/in 15years.) does not qualify.   Additional Screening:  Hepatitis C Screening: does qualify; pt would like to add this to blood work orders at next in office visit.   Vision Screening: Recommended annual ophthalmology exams for early detection of glaucoma and other disorders of the eye.  Dental Screening: Recommended annual dental exams for proper oral hygiene  Community Resource Referral:  CRR required this visit?  No       Plan:  I have personally reviewed and addressed the Medicare  Annual Wellness questionnaire and have noted the following in the patient's chart:  A. Medical and social history B. Use of alcohol, tobacco or illicit drugs  C. Current medications and supplements D. Functional ability and status E.  Nutritional status F.  Physical activity G. Advance directives H. List of other physicians  I.  Hospitalizations, surgeries, and ER visits in previous 12 months J.  Vitals K. Screenings such as hearing and vision if needed, cognitive and depression L. Referrals and appointments   In addition, I have reviewed and discussed with patient certain preventive protocols, quality metrics, and best practice recommendations. A written personalized care plan for preventive services as well as general preventive health recommendations were provided to patient. Nurse Health Advisor  Signed,    Latoya Cordova [place]umet[place]oski, [place]LPN  1/6/10967/11/2018 Nurse Health Advisor   Nurse Notes: Pt would like the Hep C lab order added to her next in office visit. Pt declined the tetanus vaccine. Mammogram order sent today.

## 2018-10-03 NOTE — Patient Instructions (Signed)
Latoya Cordova , Thank you for taking time to come for your Medicare Wellness Visit. I appreciate your ongoing commitment to your health goals. Please review the following plan we discussed and let me know if I can assist you in the future.   Screening recommendations/referrals: Colonoscopy: Up to date, due 08/2023 Mammogram: Ordered today. Pt provided with contact info and advised to call to schedule appt. Pt aware the office will call re: appt. Bone Density: Up to date, previous DEXA was normal. No repeat needed.  Recommended yearly ophthalmology/optometry visit for glaucoma screening and checkup Recommended yearly dental visit for hygiene and checkup  Vaccinations: Influenza vaccine: Up to date Pneumococcal vaccine: Completed series Tdap vaccine: Pt declines today.  Shingles vaccine: Pt declines today.     Advanced directives: Please bring a copy of your POA (Power of Attorney) and/or Living Will to your next appointment.   Conditions/risks identified: Continue to try and get out and walk for 3 days a week for at least 30 minutes at a time.   Next appointment: 10/09/18 @ 2:20 PM with Dr Rosanna Randy.    Preventive Care 76 Years and Older, Female Preventive care refers to lifestyle choices and visits with your health care provider that can promote health and wellness. What does preventive care include?  A yearly physical exam. This is also called an annual well check.  Dental exams once or twice a year.  Routine eye exams. Ask your health care provider how often you should have your eyes checked.  Personal lifestyle choices, including:  Daily care of your teeth and gums.  Regular physical activity.  Eating a healthy diet.  Avoiding tobacco and drug use.  Limiting alcohol use.  Practicing safe sex.  Taking low-dose aspirin every day.  Taking vitamin and mineral supplements as recommended by your health care provider. What happens during an annual well check? The services  and screenings done by your health care provider during your annual well check will depend on your age, overall health, lifestyle risk factors, and family history of disease. Counseling  Your health care provider may ask you questions about your:  Alcohol use.  Tobacco use.  Drug use.  Emotional well-being.  Home and relationship well-being.  Sexual activity.  Eating habits.  History of falls.  Memory and ability to understand (cognition).  Work and work Statistician.  Reproductive health. Screening  You may have the following tests or measurements:  Height, weight, and BMI.  Blood pressure.  Lipid and cholesterol levels. These may be checked every 5 years, or more frequently if you are over 23 years old.  Skin check.  Lung cancer screening. You may have this screening every year starting at age 67 if you have a 30-pack-year history of smoking and currently smoke or have quit within the past 15 years.  Fecal occult blood test (FOBT) of the stool. You may have this test every year starting at age 5.  Flexible sigmoidoscopy or colonoscopy. You may have a sigmoidoscopy every 5 years or a colonoscopy every 10 years starting at age 39.  Hepatitis C blood test.  Hepatitis B blood test.  Sexually transmitted disease (STD) testing.  Diabetes screening. This is done by checking your blood sugar (glucose) after you have not eaten for a while (fasting). You may have this done every 1-3 years.  Bone density scan. This is done to screen for osteoporosis. You may have this done starting at age 63.  Mammogram. This may be done every 1-2 years.  Talk to your health care provider about how often you should have regular mammograms. Talk with your health care provider about your test results, treatment options, and if necessary, the need for more tests. Vaccines  Your health care provider may recommend certain vaccines, such as:  Influenza vaccine. This is recommended every year.   Tetanus, diphtheria, and acellular pertussis (Tdap, Td) vaccine. You may need a Td booster every 10 years.  Zoster vaccine. You may need this after age 74.  Pneumococcal 13-valent conjugate (PCV13) vaccine. One dose is recommended after age 74.  Pneumococcal polysaccharide (PPSV23) vaccine. One dose is recommended after age 74. Talk to your health care provider about which screenings and vaccines you need and how often you need them. This information is not intended to replace advice given to you by your health care provider. Make sure you discuss any questions you have with your health care provider. Document Released: 04/09/2015 Document Revised: 12/01/2015 Document Reviewed: 01/12/2015 Elsevier Interactive Patient Education  2017 [employer]  Fall Prevention in the Home Falls can cause injuries. They can happen to people of all ages. There are many things you can do to make your home safe and to help prevent falls. What can I do on the outside of my home?  Regularly fix the edges of walkways and driveways and fix any cracks.  Remove anything that might make you trip as you walk through a door, such as a raised step or threshold.  Trim any bushes or trees on the path to your home.  Use bright outdoor lighting.  Clear any walking paths of anything that might make someone trip, such as rocks or tools.  Regularly check to see if handrails are loose or broken. Make sure that both sides of any steps have handrails.  Any raised decks and porches should have guardrails on the edges.  Have any leaves, snow, or ice cleared regularly.  Use sand or salt on walking paths during winter.  Clean up any spills in your garage right away. This includes oil or grease spills. What can I do in the bathroom?  Use night lights.  Install grab bars by the toilet and in the tub and shower. Do not use towel bars as grab bars.  Use non-skid mats or decals in the tub or shower.  If you need to  sit down in the shower, use a plastic, non-slip stool.  Keep the floor dry. Clean up any water that spills on the floor as soon as it happens.  Remove soap buildup in the tub or shower regularly.  Attach bath mats securely with double-sided non-slip rug tape.  Do not have throw rugs and other things on the floor that can make you trip. What can I do in the bedroom?  Use night lights.  Make sure that you have a light by your bed that is easy to reach.  Do not use any sheets or blankets that are too big for your bed. They should not hang down onto the floor.  Have a firm chair that has side arms. You can use this for support while you get dressed.  Do not have throw rugs and other things on the floor that can make you trip. What can I do in the kitchen?  Clean up any spills right away.  Avoid walking on wet floors.  Keep items that you use a lot in easy-to-reach places.  If you need to reach something above you, use a strong step stool  that has a grab bar.  Keep electrical cords out of the way.  Do not use floor polish or wax that makes floors slippery. If you must use wax, use non-skid floor wax.  Do not have throw rugs and other things on the floor that can make you trip. What can I do with my stairs?  Do not leave any items on the stairs.  Make sure that there are handrails on both sides of the stairs and use them. Fix handrails that are broken or loose. Make sure that handrails are as long as the stairways.  Check any carpeting to make sure that it is firmly attached to the stairs. Fix any carpet that is loose or worn.  Avoid having throw rugs at the top or bottom of the stairs. If you do have throw rugs, attach them to the floor with carpet tape.  Make sure that you have a light switch at the top of the stairs and the bottom of the stairs. If you do not have them, ask someone to add them for you. What else can I do to help prevent falls?  Wear shoes that:  Do not  have high heels.  Have rubber bottoms.  Are comfortable and fit you well.  Are closed at the toe. Do not wear sandals.  If you use a stepladder:  Make sure that it is fully opened. Do not climb a closed stepladder.  Make sure that both sides of the stepladder are locked into place.  Ask someone to hold it for you, if possible.  Clearly mark and make sure that you can see:  Any grab bars or handrails.  First and last steps.  Where the edge of each step is.  Use tools that help you move around (mobility aids) if they are needed. These include:  Canes.  Walkers.  Scooters.  Crutches.  Turn on the lights when you go into a dark area. Replace any light bulbs as soon as they burn out.  Set up your furniture so you have a clear path. Avoid moving your furniture around.  If any of your floors are uneven, fix them.  If there are any pets around you, be aware of where they are.  Review your medicines with your doctor. Some medicines can make you feel dizzy. This can increase your chance of falling. Ask your doctor what other things that you can do to help prevent falls. This information is not intended to replace advice given to you by your health care provider. Make sure you discuss any questions you have with your health care provider. Document Released: 01/07/2009 Document Revised: 08/19/2015 Document Reviewed: 04/17/2014 Elsevier Interactive Patient Education  2017 [employer]

## 2018-10-09 ENCOUNTER — Other Ambulatory Visit: Payer: Self-pay

## 2018-10-09 ENCOUNTER — Encounter: Payer: Self-pay | Admitting: Family Medicine

## 2018-10-09 ENCOUNTER — Ambulatory Visit (INDEPENDENT_AMBULATORY_CARE_PROVIDER_SITE_OTHER): Payer: PPO | Admitting: Family Medicine

## 2018-10-09 VITALS — BP 122/78 | HR 110 | Temp 98.1°F | Resp 24 | Wt 221.0 lb

## 2018-10-09 DIAGNOSIS — Z6838 Body mass index (BMI) 38.0-38.9, adult: Secondary | ICD-10-CM

## 2018-10-09 DIAGNOSIS — G8929 Other chronic pain: Secondary | ICD-10-CM

## 2018-10-09 DIAGNOSIS — I5189 Other ill-defined heart diseases: Secondary | ICD-10-CM

## 2018-10-09 DIAGNOSIS — I5032 Chronic diastolic (congestive) heart failure: Secondary | ICD-10-CM | POA: Diagnosis not present

## 2018-10-09 DIAGNOSIS — M1990 Unspecified osteoarthritis, unspecified site: Secondary | ICD-10-CM | POA: Diagnosis not present

## 2018-10-09 DIAGNOSIS — R51 Headache: Secondary | ICD-10-CM

## 2018-10-09 MED ORDER — HYDROCODONE-ACETAMINOPHEN 5-325 MG PO TABS: 1.0000 | ORAL_TABLET | Freq: Four times a day (QID) | ORAL | 0 refills | Status: DC | PRN

## 2018-10-09 MED ORDER — BUTALBITAL-ASPIRIN-CAFFEINE 50-325-40 MG PO CAPS: 1.0000 | ORAL_CAPSULE | Freq: Two times a day (BID) | ORAL | 2 refills | Status: DC | PRN

## 2018-10-09 NOTE — Progress Notes (Signed)
Patient: Latoya BenderSylvia K Flannagan Female    DOB: [birth year]   73 y.o.   MRN: [id number] Visit Date: 10/09/2018  Today's Provider: Megan Mansichard Bryne Lindon Jr, MD   Chief Complaint  Patient presents with  . Congestive Heart Failure  . Depression  . Hypertension  . Hypothyroidism   Subjective:    HPI Patient comes in today for a follow up. She was last seen in the office 6 months ago.   CHF Patient was advised to start lasix 20mg  every other day. She reports that she is tolerating med changes well.   Depression There were no changes in medications since last visit. Patient reports good compliance, and good symptom control.  [facility] 10/03/2018 07/09/2017 02/19/2017  Decreased Interest 0 1 0  Down, Depressed, Hopeless 1 1 1   PHQ - 2 Score 1 2 1   Altered sleeping - 2 1  Tired, decreased energy - 2 1  Change in appetite - 1 1  Feeling bad or failure about yourself  - 1 0  Trouble concentrating - 0 0  Moving slowly or fidgety/restless - 0 0  Suicidal thoughts - 0 0  PHQ-9 Score - 8 4  Difficult doing work/chores - Not difficult at all Not difficult at all    Hypertension, follow-up:  BP Readings from Last 3 Encounters:  10/09/18 122/78  04/04/18 124/80  03/13/18 118/64    She was last seen for hypertension 6 months ago.  BP at that visit was 124/80. Management since that visit includes no changes. She reports good compliance with treatment. She is not having side effects.  She is not exercising. She is adherent to low salt diet.   Outside blood pressures are checked occasionally. She is experiencing none.  Patient denies exertional chest pressure/discomfort and palpitations.   Cardiovascular risk factors include obesity (BMI >= 30 kg/m2).   Weight trend: stable Wt Readings from Last 3 Encounters:  10/09/18 221 lb (100.2 kg)  04/04/18 223 lb (101.2 kg)  03/13/18 226 lb (102.5 kg)    Current diet: well balanced    Allergies  Allergen Reactions  .  Sulfa Antibiotics     Per Mother  . Lodine  [Etodolac] Rash    Abdominal rash.     Current Outpatient Medications:  .  ALPRAZolam (XANAX) 0.5 MG tablet, Take 1 tablet (0.5 mg total) by mouth at bedtime as needed for anxiety., Disp: 30 tablet, Rfl: 5 .  buPROPion (WELLBUTRIN XL) 150 MG 24 hr tablet, TAKE 1 TABLET(150 MG) BY MOUTH DAILY, Disp: 90 tablet, Rfl: 3 .  butalbital-aspirin-caffeine (FIORINAL) 50-325-40 MG capsule, Take 1 capsule by mouth 2 (two) times daily as needed for headache., Disp: 55 capsule, Rfl: 2 .  furosemide (LASIX) 20 MG tablet, TAKE 1 TABLET(20 MG) BY MOUTH EVERY MORNING, Disp: 30 tablet, Rfl: 3 .  hydrochlorothiazide (HYDRODIURIL) 25 MG tablet, TAKE 1 TABLET BY MOUTH ONCE DAILY, Disp: 90 tablet, Rfl: 3 .  HYDROcodone-acetaminophen (NORCO/VICODIN) 5-325 MG tablet, Take 1-2 tablets by mouth every 6 (six) hours as needed for moderate pain., Disp: 55 tablet, Rfl: 0 .  levothyroxine (SYNTHROID, LEVOTHROID) 25 MCG tablet, TAKE 1 TABLET BY MOUTH ONCE DAILY, Disp: 90 tablet, Rfl: 3 .  PARoxetine (PAXIL) 40 MG tablet, TAKE 1 TABLET BY MOUTH ONCE DAILY, Disp: 30 tablet, Rfl: 11 .  butalbital-aspirin-caffeine (FIORINAL) 50-325-40 MG capsule, Take 1 capsule by mouth 2 (two) times daily as needed for headache. (Patient not taking: Reported on 10/03/2018), Disp:  55 capsule, Rfl: 2 .  HYDROcodone-acetaminophen (NORCO/VICODIN) 5-325 MG tablet, Take 1-2 tablets by mouth every 6 (six) hours as needed for moderate pain. (Patient not taking: Reported on 10/03/2018), Disp: 55 tablet, Rfl: 0 .  metoprolol succinate (TOPROL XL) 25 MG 24 hr tablet, Take 1 tablet (25 mg total) by mouth daily. (Patient not taking: Reported on 10/03/2018), Disp: 90 tablet, Rfl: 4 .  PARoxetine (PAXIL) 40 MG tablet, TAKE 1 TABLET(40 MG) BY MOUTH DAILY (Patient not taking: Reported on 10/03/2018), Disp: 90 tablet, Rfl: 3 .  PNEUMOVAX 23 25 MCG/0.5ML injection, inject 0.5 milliliter intramuscularly, Disp: , Rfl: 0 .  potassium  chloride (K-DUR) 10 MEQ tablet, Take 1 tablet (10 mEq total) by mouth daily as needed. (Patient not taking: Reported on 10/03/2018), Disp: 30 tablet, Rfl: 3  Review of Systems  Constitutional: Positive for fatigue. Negative for activity change.  Eyes: Negative.   Respiratory: Positive for shortness of breath. Negative for cough and wheezing.   Cardiovascular: Negative for chest pain, palpitations and leg swelling.  Gastrointestinal: Negative.   Endocrine: Negative.   Musculoskeletal: Negative for arthralgias.  Allergic/Immunologic: Negative.   Neurological: Negative for dizziness, light-headedness and headaches.  Psychiatric/Behavioral: Negative for agitation, self-injury and sleep disturbance. The patient is not hyperactive.     Social History   Tobacco Use  . Smoking status: Never Smoker  . Smokeless tobacco: Never Used  Substance Use Topics  . Alcohol use: No      Objective:   BP 122/78 (BP Location: Left Arm, Patient Position: Sitting, Cuff Size: Large)   Pulse (!) 110   Temp 98.1 F (36.7 C)   Resp (!) 24   Wt 221 lb (100.2 kg)   SpO2 94%   BMI 37.93 kg/m  Vitals:   10/09/18 1428  BP: 122/78  Pulse: (!) 110  Resp: (!) 24  Temp: 98.1 F (36.7 C)  SpO2: 94%  Weight: 221 lb (100.2 kg)   Recheck HR is 80.Recheck RR is 16.  Physical Exam Vitals signs reviewed.  Constitutional:      Appearance: She is well-developed. She is obese.  HENT:     Head: Normocephalic and atraumatic.     Right Ear: Tympanic membrane and external ear normal.     Left Ear: Tympanic membrane and external ear normal.     Nose: Nose normal.     Mouth/Throat:     Pharynx: Oropharynx is clear.  Eyes:     General: No scleral icterus.    Conjunctiva/sclera: Conjunctivae normal.     Pupils: Pupils are equal, round, and reactive to light.  Neck:     Musculoskeletal: Normal range of motion and neck supple.     Thyroid: No thyromegaly.  Cardiovascular:     Rate and Rhythm: Normal rate and  regular rhythm.     Heart sounds: Normal heart sounds.  Pulmonary:     Effort: Pulmonary effort is normal.     Breath sounds: Normal breath sounds.  Abdominal:     General: Bowel sounds are normal.     Palpations: Abdomen is soft.  Musculoskeletal: Normal range of motion.  Skin:    General: Skin is warm and dry.  Neurological:     General: No focal deficit present.     Mental Status: She is alert and oriented to person, place, and time.     Deep Tendon Reflexes: Reflexes are normal and symmetric.  Psychiatric:        Mood and Affect: Mood normal.  Behavior: Behavior normal.        Thought Content: Thought content normal.        Judgment: Judgment normal.      No results found for any visits on 10/09/18.     Assessment & Plan    1. Osteoarthritis, unspecified osteoarthritis type, unspecified site Pt instructed to limit to 1-2 per week. - HYDROcodone-acetaminophen (NORCO/VICODIN) 5-325 MG tablet; Take 1-2 tablets by mouth every 6 (six) hours as needed for moderate pain.  Dispense: 50 tablet; Refill: 0  2. Chronic nonintractable headache, unspecified headache type Also 1-2 pills per week only. - butalbital-aspirin-caffeine (FIORINAL) 50-325-40 MG capsule; Take 1 capsule by mouth 2 (two) times daily as needed for headache.  Dispense: 50 capsule; Refill: 2  3. Class 2 severe obesity due to excess calories with serious comorbidity and body mass index (BMI) of 38.0 to 38.9 in adult [facility]) With HTN/OA/CHF  4. Chronic diastolic CHF (congestive heart failure) (HCC) Diastolic.  5. Diastolic dysfunction      Wilhemena Durie, MD  [facility]

## 2018-10-16 ENCOUNTER — Other Ambulatory Visit: Payer: Self-pay | Admitting: Family Medicine

## 2018-11-12 NOTE — Progress Notes (Signed)
Virtual Visit via Telephone Note   This visit type was conducted due to national recommendations for restrictions regarding the COVID-19 Pandemic (e.g. social distancing) in an effort to limit this patient's exposure and mitigate transmission in our community.  Due to her co-morbid illnesses, this patient is at least at moderate risk for complications without adequate follow up.  This format is felt to be most appropriate for this patient at this time.  The patient did not have access to video technology/had technical difficulties with video requiring transitioning to audio format only (telephone).  All issues noted in this document were discussed and addressed.  No physical exam could be performed with this format.  Please refer to the patient's chart for her  consent to telehealth [facility].   I connected with  Silas SacramentoSylvia K Coltrane on 11/13/18 by a video enabled telemedicine application and verified that I am speaking with the correct person using two identifiers. I discussed the limitations of evaluation and management by telemedicine. The patient expressed understanding and agreed to proceed.   Evaluation Performed:  Follow-up visit  Date:  11/13/2018   ID:  Lorayne BenderSylvia K Danh, DOB [birth year], MRN [id number]  Patient Location:  [address] [place] [id number]   Provider location:   [facility] [place] office  PCP:  Maple HudsonGilbert, Richard L Jr., MD  Cardiologist:  Hubbard RobinsonGollan, [facility]   Chief Complaint:  SOB    History of Present Illness:    Lorayne BenderSylvia K Olenik is a 73 y.o. female who presents via audio/video conferencing for a telehealth visit today.   The patient does not symptoms concerning for COVID-19 infection (fever, chills, cough, or new SHORTNESS OF BREATH).   Patient has a past medical history of HTN,  obesity,   anxiety with previous admission to the hospital 1/12 to 04/11/15 for acute respiratory distress in the setting of likely viral pneumonitis or  pneumonia, Treated with levaquin She presents today for follow-up of her diastolic CHF, tachycardia  Chronic SOB, "due to weight" Take lasix QOD  high fluid intake, water Weight 218 "bored, hungry"  Does not use a cane/walker Gets a spasm in the hip  No regular exercise program,  mild fluid retention in her hands, legs, abdomen  SOB with stairs, has to take a break   Other past medical history reviewed Previous Echo showed EF 60-65%, RWMA could be be excluded, GR1DD, ascending aorta mildly dilated at 37 mm, normal right sided pressure.  CT scan of the chest There is no significant coronary artery calcifications, no significant carotid, aortic atherosclerosis  CTA was negative for PE, though there was motion artifact. CXR showed increased interstital prominence suggesting possibly a viral etiology.    Prior CV studies:   The following studies were reviewed today:    Past Medical History:  Diagnosis Date  . Anxiety   . Diastolic dysfunction    a. echo 08/2014: EF 50-55%, mild MR; b. echo 03/2015: EF 60-65%, RWMA could not be excluded, GR1DD, ascending aorta mildly dilated at 37mm, normal right-sided pressure  . Hyperlipidemia   . Hypertension   . Morbid obesity (HCC)    Past Surgical History:  Procedure Laterality Date  . ABDOMINAL HYSTERECTOMY  08/18/2004  . APPENDECTOMY        Allergies:   Sulfa antibiotics and Lodine  [etodolac]   Social History   Tobacco Use  . Smoking status: Never Smoker  . Smokeless tobacco: Never Used  Substance Use Topics  .  Alcohol use: No  . Drug use: No     Current Outpatient Medications on File Prior to Visit  Medication Sig Dispense Refill  . ALPRAZolam (XANAX) 0.5 MG tablet TAKE 1 TABLET(0.5 MG) BY MOUTH AT BEDTIME AS NEEDED FOR ANXIETY 30 tablet 3  . buPROPion (WELLBUTRIN XL) 150 MG 24 hr tablet TAKE 1 TABLET(150 MG) BY MOUTH DAILY 90 tablet 3  . butalbital-aspirin-caffeine (FIORINAL) 50-325-40 MG capsule Take 1 capsule  by mouth 2 (two) times daily as needed for headache. 50 capsule 2  . furosemide (LASIX) 20 MG tablet TAKE 1 TABLET(20 MG) BY MOUTH EVERY MORNING 30 tablet 3  . hydrochlorothiazide (HYDRODIURIL) 25 MG tablet TAKE 1 TABLET BY MOUTH ONCE DAILY 90 tablet 3  . HYDROcodone-acetaminophen (NORCO/VICODIN) 5-325 MG tablet Take 1-2 tablets by mouth every 6 (six) hours as needed for moderate pain. 50 tablet 0  . levothyroxine (SYNTHROID, LEVOTHROID) 25 MCG tablet TAKE 1 TABLET BY MOUTH ONCE DAILY 90 tablet 3  . PARoxetine (PAXIL) 40 MG tablet TAKE 1 TABLET BY MOUTH ONCE DAILY 30 tablet 11   No current facility-administered medications on file prior to visit.      Family Hx: The patient's family history includes Breast cancer in her cousin and maternal aunt; Congestive Heart Failure in her mother; Heart attack in her paternal grandfather; Hypertension in her mother; Ulcers in her father.  ROS:   Please see the history of present illness.    Review of Systems  Constitutional: Negative.   HENT: Negative.   Respiratory: Negative.   Cardiovascular: Negative.   Gastrointestinal: Negative.   Musculoskeletal: Negative.   Neurological: Negative.   Psychiatric/Behavioral: Negative.   All other systems reviewed and are negative.    Labs/Other Tests and Data Reviewed:    Recent Labs: 03/13/2018: ALT 44; BUN 19; Creatinine, Ser 0.96; Hemoglobin 14.8; NT-Pro BNP 133; Platelets 202; Potassium 3.9; Sodium 140; TSH 2.910   Recent Lipid Panel Lab Results  Component Value Date/Time   CHOL 194 02/19/2017 02:45 PM   CHOL 196 01/19/2015 10:21 AM   TRIG 222 (H) 02/19/2017 02:45 PM   HDL 51 02/19/2017 02:45 PM   HDL 44 01/19/2015 10:21 AM   CHOLHDL 3.8 02/19/2017 02:45 PM   LDLCALC 109 (H) 02/19/2017 02:45 PM    Wt Readings from Last 3 Encounters:  11/13/18 218 lb (98.9 kg)  10/09/18 221 lb (100.2 kg)  04/04/18 223 lb (101.2 kg)     Exam:    Vital Signs: Vital signs may also be detailed in the HPI  BP 122/78 (BP Location: Left Arm, Patient Position: Sitting, Cuff Size: Normal) Comment: Last month  Pulse (!) 110 Comment: Last month  Temp 98.1 F (36.7 C) Comment: Last month  Resp (!) 24 Comment: Last month  Ht 5\' 4"  (1.626 m)   Wt 218 lb (98.9 kg)   SpO2 94% Comment: Last Month  BMI 37.42 kg/m   Wt Readings from Last 3 Encounters:  11/13/18 218 lb (98.9 kg)  10/09/18 221 lb (100.2 kg)  04/04/18 223 lb (101.2 kg)   Temp Readings from Last 3 Encounters:  11/13/18 98.1 F (36.7 C)  10/09/18 98.1 F (36.7 C)  04/04/18 97.6 F (36.4 C) (Oral)   BP Readings from Last 3 Encounters:  11/13/18 122/78  10/09/18 122/78  04/04/18 124/80   Pulse Readings from Last 3 Encounters:  11/13/18 (!) 110  10/09/18 (!) 110  04/04/18 100     Well nourished, well developed female in no acute distress.  Constitutional:  oriented to person, place, and time. No distress.  Head: Normocephalic and atraumatic.  Eyes:  no discharge. No scleral icterus.  Neck: Normal range of motion. Neck supple.  Pulmonary/Chest: No audible wheezing, no distress, appears comfortable Musculoskeletal: Normal range of motion.  no  tenderness or deformity.  Neurological:   Coordination normal. Full exam not performed Skin:  No rash Psychiatric:  normal mood and affect. behavior is normal. Thought content normal.    ASSESSMENT & PLAN:    Problem List Items Addressed This Visit      Cardiology Problems   Chronic diastolic CHF (congestive heart failure) (HCC) - Primary   Hypertension   HLD (hyperlipidemia)     Other   Chest pain   Sinus tachycardia   SOB (shortness of breath)     Morbid obesity contributing to SOB, very deconditioned, Encouraged a walking program, use a walker Limited by hip pain Wants [facility] Continue lasix daily  HTN Blood pressure is well controlled on today's visit. No changes made to the medications.  Hip pain Will talk with Dr. Sullivan LoneGilbert whether [facility] She is sedentary   COVID-19 Education: The signs and symptoms of COVID-19 were discussed with the patient and how to seek care for testing (follow up with PCP or arrange E-visit).  The importance of social distancing was discussed today.  Patient Risk:   After full review of this patients clinical status, I feel that they are at least moderate risk at this time.  Time:   Today, I have spent 25 minutes with the patient with telehealth technology discussing the cardiac and medical problems/diagnoses detailed above   Additional 10 min spent reviewing the chart prior to patient visit today   Medication Adjustments/Labs and Tests Ordered: Current medicines are reviewed at length with the patient today.  Concerns regarding medicines are outlined above.   Tests Ordered: No tests ordered   Medication Changes: No changes made   Disposition: Follow-up in 12 months   Signed, Julien Nordmannimothy Shanora Christensen, MD  [facility] [place] Office [address], [place], [place] [address]

## 2018-11-13 ENCOUNTER — Telehealth (INDEPENDENT_AMBULATORY_CARE_PROVIDER_SITE_OTHER): Payer: PPO | Admitting: Cardiovascular Disease

## 2018-11-13 ENCOUNTER — Encounter: Payer: Self-pay | Admitting: Cardiovascular Disease

## 2018-11-13 ENCOUNTER — Other Ambulatory Visit: Payer: Self-pay

## 2018-11-13 VITALS — BP 122/78 | HR 110 | Temp 98.1°F | Resp 24 | Ht 64.0 in | Wt 218.0 lb

## 2018-11-13 DIAGNOSIS — R079 Chest pain, unspecified: Secondary | ICD-10-CM | POA: Diagnosis not present

## 2018-11-13 DIAGNOSIS — M797 Fibromyalgia: Secondary | ICD-10-CM

## 2018-11-13 DIAGNOSIS — R000 Tachycardia, unspecified: Secondary | ICD-10-CM

## 2018-11-13 DIAGNOSIS — I110 Hypertensive heart disease with heart failure: Secondary | ICD-10-CM

## 2018-11-13 DIAGNOSIS — E785 Hyperlipidemia, unspecified: Secondary | ICD-10-CM

## 2018-11-13 DIAGNOSIS — E782 Mixed hyperlipidemia: Secondary | ICD-10-CM

## 2018-11-13 DIAGNOSIS — I5032 Chronic diastolic (congestive) heart failure: Secondary | ICD-10-CM

## 2018-11-13 DIAGNOSIS — Z6837 Body mass index (BMI) 37.0-37.9, adult: Secondary | ICD-10-CM | POA: Diagnosis not present

## 2018-11-13 DIAGNOSIS — R0602 Shortness of breath: Secondary | ICD-10-CM

## 2018-11-13 DIAGNOSIS — M25559 Pain in unspecified hip: Secondary | ICD-10-CM | POA: Diagnosis not present

## 2018-11-13 DIAGNOSIS — I10 Essential (primary) hypertension: Secondary | ICD-10-CM

## 2018-11-13 MED ORDER — FUROSEMIDE 20 MG PO TABS: 20.0000 mg | ORAL_TABLET | Freq: Every day | ORAL | 3 refills | Status: DC

## 2018-11-13 MED ORDER — HYDROCHLOROTHIAZIDE 25 MG PO TABS: 25.0000 mg | ORAL_TABLET | Freq: Every day | ORAL | 3 refills | Status: DC

## 2018-11-13 NOTE — Progress Notes (Signed)
Called over to [facility] and spoke with Juliann Pulse. Reviewed request for patient to be seen there for hip pain and weakness. She requested that I fax office visit note and demographics to Fax # [phone]. Advised that I would get this information sent over to her tomorrow once provider has note completed. She was appreciative for the referral and had no further questions at this time.

## 2018-11-13 NOTE — Patient Instructions (Addendum)
Refer to [facility] for hip pain, weakness Evaluation and treatment for her hip pain and weakness Located at: [facility] [address] [place], [place]  Medication Instructions:  No changes  If you need a refill on your cardiac medications before your next appointment, please call your pharmacy.    Lab work: No new labs needed   If you have labs (blood work) drawn today and your tests are completely normal, you will receive your results only by: Marland Kitchen MyChart Message (if you have MyChart) OR . A paper copy in the mail If you have any lab test that is abnormal or we need to change your treatment, we will call you to review the results.   Testing/Procedures: No new testing needed   Follow-Up: At [facility], you and your health needs are our priority.  As part of our continuing mission to provide you with exceptional heart care, we have created designated Provider Care Teams.  These Care Teams include your primary Cardiologist (physician) and Advanced Practice Providers (APPs -  Physician Assistants and Nurse Practitioners) who all work together to provide you with the care you need, when you need it.  . You will need a follow up appointment in 12 months .   Please call our office 2 months in advance to schedule this appointment.    . Providers on your designated Care Team:   . Murray Hodgkins, NP . Christell Faith, PA-C . Marrianne Mood, PA-C  Any Other Special Instructions Will Be Listed Below (If Applicable).  For educational health videos Log in to : [email or web address] Or : [email or web address], password : [email or web address]

## 2018-11-21 ENCOUNTER — Other Ambulatory Visit: Payer: Self-pay | Admitting: Family Medicine

## 2019-02-19 ENCOUNTER — Other Ambulatory Visit: Payer: Self-pay

## 2019-02-28 NOTE — Progress Notes (Signed)
Patient: Latoya Cordova Female    DOB: [birth year]   73 y.o.   MRN: [id number] Visit Date: 03/06/2019  Today's Provider: Wilhemena Durie, MD   Chief Complaint  Patient presents with  . Follow-up   Subjective:     HPI   Osteoarthritis, unspecified osteoarthritis type, unspecified site From 10/09/2018-Patient instructed to limitHYDROcodone-acetaminophen to 1-2 per week.  Chronic nonintractable headache, unspecified headache type From 10/09/2018-Also 1-2 pills per week only.  Chronic diastolic CHF (congestive heart failure) ([place]) From 10/09/2018-Diastolic.  Anxiety: Patient is requesting a prescription for Lorazepam. She is unsure if she is taking Xanax, but reports Lorazepam has helped relieve anxiety symptoms in the past.  Allergies  Allergen Reactions  . Sulfa Antibiotics     Per Mother  . Lodine  [Etodolac] Rash    Abdominal rash.     Current Outpatient Medications:  .  buPROPion (WELLBUTRIN XL) 150 MG 24 hr tablet, TAKE 1 TABLET(150 MG) BY MOUTH DAILY, Disp: 90 tablet, Rfl: 3 .  butalbital-aspirin-caffeine (FIORINAL) 50-325-40 MG capsule, Take 1 capsule by mouth 2 (two) times daily as needed for headache., Disp: 50 capsule, Rfl: 2 .  furosemide (LASIX) 20 MG tablet, TAKE 1 TABLET(20 MG) BY MOUTH EVERY MORNING, Disp: 30 tablet, Rfl: 11 .  hydrochlorothiazide (HYDRODIURIL) 25 MG tablet, Take 1 tablet (25 mg total) by mouth daily., Disp: 90 tablet, Rfl: 3 .  HYDROcodone-acetaminophen (NORCO/VICODIN) 5-325 MG tablet, Take 1-2 tablets by mouth every 6 (six) hours as needed for moderate pain., Disp: 50 tablet, Rfl: 0 .  levothyroxine (SYNTHROID, LEVOTHROID) 25 MCG tablet, TAKE 1 TABLET BY MOUTH ONCE DAILY, Disp: 90 tablet, Rfl: 3 .  PARoxetine (PAXIL) 40 MG tablet, TAKE 1 TABLET BY MOUTH ONCE DAILY, Disp: 30 tablet, Rfl: 11 .  ALPRAZolam (XANAX) 0.5 MG tablet, TAKE 1 TABLET(0.5 MG) BY MOUTH AT BEDTIME AS NEEDED FOR ANXIETY, Disp: 30 tablet, Rfl: 3  Review of  Systems  Constitutional: Negative for appetite change, chills, fatigue and fever.  HENT: Negative.   Eyes: Negative.   Respiratory: Negative for chest tightness and shortness of breath.   Cardiovascular: Negative for chest pain and palpitations.  Gastrointestinal: Negative.  Negative for abdominal pain, nausea and vomiting.  Endocrine: Negative.   Musculoskeletal: Positive for arthralgias.  Allergic/Immunologic: Negative.   Neurological: Negative for dizziness and weakness.  Hematological: Negative.   Psychiatric/Behavioral: The patient is nervous/anxious.     Social History   Tobacco Use  . Smoking status: Never Smoker  . Smokeless tobacco: Never Used  Substance Use Topics  . Alcohol use: No      Objective:   BP 110/70 (BP Location: Left Arm, Patient Position: Sitting, Cuff Size: Large)   Pulse (!) 114   Temp (!) 97.3 F (36.3 C) (Temporal)   Resp 18   Wt 219 lb (99.3 kg)   SpO2 97% Comment: room air  BMI 37.59 kg/m  Vitals:   03/06/19 1444  BP: 110/70  Pulse: (!) 114  Resp: 18  Temp: (!) 97.3 F (36.3 C)  TempSrc: Temporal  SpO2: 97%  Weight: 219 lb (99.3 kg)  Body mass index is 37.59 kg/m.   Physical Exam Vitals reviewed.  Constitutional:      Appearance: She is well-developed. She is obese.  HENT:     Head: Normocephalic and atraumatic.     Right Ear: Tympanic membrane and external ear normal.     Left Ear: Tympanic membrane and external ear normal.  Nose: Nose normal.     Mouth/Throat:     Pharynx: Oropharynx is clear.  Eyes:     General: No scleral icterus.    Conjunctiva/sclera: Conjunctivae normal.     Pupils: Pupils are equal, round, and reactive to light.  Neck:     Thyroid: No thyromegaly.  Cardiovascular:     Rate and Rhythm: Normal rate and regular rhythm.     Heart sounds: Normal heart sounds.  Pulmonary:     Effort: Pulmonary effort is normal.     Breath sounds: Normal breath sounds.  Abdominal:     General: Bowel sounds are  normal.     Palpations: Abdomen is soft.  Musculoskeletal:        General: Normal range of motion.     Cervical back: Normal range of motion and neck supple.  Skin:    General: Skin is warm and dry.  Neurological:     General: No focal deficit present.     Mental Status: She is alert and oriented to person, place, and time.     Deep Tendon Reflexes: Reflexes are normal and symmetric.  Psychiatric:        Mood and Affect: Mood normal.        Behavior: Behavior normal.        Thought Content: Thought content normal.        Judgment: Judgment normal.      No results found for any visits on 03/06/19.     Assessment & Plan    1. Need for influenza vaccination  - Flu Vaccine QUAD High Dose(Fluad)  2. Chronic nonintractable headache, unspecified headache type Advised to take no more than 1-2 a week - butalbital-aspirin-caffeine (FIORINAL) 50-325-40 MG capsule; Take 1 capsule by mouth 2 (two) times daily as needed for headache.  Dispense: 50 capsule; Refill: 2  3. Osteoarthritis, unspecified osteoarthritis type, unspecified site Limit to 1 to 2/week - HYDROcodone-acetaminophen (NORCO/VICODIN) 5-325 MG tablet; Take 1-2 tablets by mouth every 6 (six) hours as needed for moderate pain.  Dispense: 50 tablet; Refill: 0  4. Essential (primary) hypertension Good control on HCTZ - CBC w/Diff/Platelet - Comprehensive Metabolic Panel (CMET)  5. Adult hypothyroidism  - TSH  6. Morbid obesity due to excess calories (HCC)  - CBC w/Diff/Platelet - Comprehensive Metabolic Panel (CMET)  7. Diastolic dysfunction   8. Anxiety, generalized  Chronic  More than 50% 25 minute visit spent in counseling or coordination of care   9. Class 2 severe obesity due to excess calories with serious comorbidity and body mass index (BMI) of 38.0 to 38.9 in adult (HCC)   10. Hyperlipidemia, unspecified hyperlipidemia type  - Lipid panel    I,[name],acting as a scribe for Megan Mans, MD.,have documented all relevant documentation on the behalf of Megan Mans, MD,as directed by  Megan Mans, MD while in the presence of Megan Mans, MD.   Megan Mans, MD  [facility]

## 2019-03-06 ENCOUNTER — Ambulatory Visit (INDEPENDENT_AMBULATORY_CARE_PROVIDER_SITE_OTHER): Payer: PPO | Admitting: Family Medicine

## 2019-03-06 ENCOUNTER — Encounter: Payer: Self-pay | Admitting: Family Medicine

## 2019-03-06 ENCOUNTER — Other Ambulatory Visit: Payer: Self-pay

## 2019-03-06 VITALS — BP 110/70 | HR 114 | Temp 97.3°F | Resp 18 | Wt 219.0 lb

## 2019-03-06 DIAGNOSIS — E039 Hypothyroidism, unspecified: Secondary | ICD-10-CM | POA: Diagnosis not present

## 2019-03-06 DIAGNOSIS — I5189 Other ill-defined heart diseases: Secondary | ICD-10-CM

## 2019-03-06 DIAGNOSIS — Z23 Encounter for immunization: Secondary | ICD-10-CM | POA: Diagnosis not present

## 2019-03-06 DIAGNOSIS — M1990 Unspecified osteoarthritis, unspecified site: Secondary | ICD-10-CM | POA: Diagnosis not present

## 2019-03-06 DIAGNOSIS — G8929 Other chronic pain: Secondary | ICD-10-CM

## 2019-03-06 DIAGNOSIS — I10 Essential (primary) hypertension: Secondary | ICD-10-CM | POA: Diagnosis not present

## 2019-03-06 DIAGNOSIS — E785 Hyperlipidemia, unspecified: Secondary | ICD-10-CM | POA: Diagnosis not present

## 2019-03-06 DIAGNOSIS — R519 Headache, unspecified: Secondary | ICD-10-CM

## 2019-03-06 DIAGNOSIS — Z6838 Body mass index (BMI) 38.0-38.9, adult: Secondary | ICD-10-CM

## 2019-03-06 DIAGNOSIS — F411 Generalized anxiety disorder: Secondary | ICD-10-CM

## 2019-03-06 MED ORDER — BUTALBITAL-ASPIRIN-CAFFEINE 50-325-40 MG PO CAPS: 1.0000 | ORAL_CAPSULE | Freq: Two times a day (BID) | ORAL | 2 refills | Status: DC | PRN

## 2019-03-06 MED ORDER — LORAZEPAM 0.5 MG PO TABS: 0.5000 mg | ORAL_TABLET | Freq: Two times a day (BID) | ORAL | 5 refills | Status: DC | PRN

## 2019-03-06 MED ORDER — HYDROCODONE-ACETAMINOPHEN 5-325 MG PO TABS: 1.0000 | ORAL_TABLET | Freq: Four times a day (QID) | ORAL | 0 refills | Status: DC | PRN

## 2019-03-08 ENCOUNTER — Other Ambulatory Visit: Payer: Self-pay | Admitting: Family Medicine

## 2019-03-08 DIAGNOSIS — E038 Other specified hypothyroidism: Secondary | ICD-10-CM

## 2019-03-13 DIAGNOSIS — E785 Hyperlipidemia, unspecified: Secondary | ICD-10-CM | POA: Diagnosis not present

## 2019-03-13 DIAGNOSIS — I10 Essential (primary) hypertension: Secondary | ICD-10-CM | POA: Diagnosis not present

## 2019-03-13 DIAGNOSIS — E039 Hypothyroidism, unspecified: Secondary | ICD-10-CM | POA: Diagnosis not present

## 2019-03-14 LAB — COMPREHENSIVE METABOLIC PANEL
ALT: 28 IU/L (ref 0–32)
AST: 42 IU/L — ABNORMAL HIGH (ref 0–40)
Albumin/Globulin Ratio: 1.5 (ref 1.2–2.2)
Albumin: 4.5 g/dL (ref 3.7–4.7)
Alkaline Phosphatase: 74 IU/L (ref 39–117)
BUN/Creatinine Ratio: 12 (ref 12–28)
BUN: 13 mg/dL (ref 8–27)
Bilirubin Total: 0.5 mg/dL (ref 0.0–1.2)
CO2: 22 mmol/L (ref 20–29)
Calcium: 9.5 mg/dL (ref 8.7–10.3)
Chloride: 98 mmol/L (ref 96–106)
Creatinine, Ser: 1.13 mg/dL — ABNORMAL HIGH (ref 0.57–1.00)
GFR calc Af Amer: 55 mL/min/{1.73_m2} — ABNORMAL LOW (ref >59)
GFR calc non Af Amer: 48 mL/min/{1.73_m2} — ABNORMAL LOW (ref >59)
Globulin, Total: 3 g/dL (ref 1.5–4.5)
Glucose: 102 mg/dL — ABNORMAL HIGH (ref 65–99)
Potassium: 3.9 mmol/L (ref 3.5–5.2)
Sodium: 139 mmol/L (ref 134–144)
Total Protein: 7.5 g/dL (ref 6.0–8.5)

## 2019-03-14 LAB — CBC WITH DIFFERENTIAL/PLATELET
Basophils Absolute: 0.1 10*3/uL (ref 0.0–0.2)
Basos: 1 %
EOS (ABSOLUTE): 0.2 10*3/uL (ref 0.0–0.4)
Eos: 2 %
Hematocrit: 44.6 % (ref 34.0–46.6)
Hemoglobin: 15.7 g/dL (ref 11.1–15.9)
Immature Grans (Abs): 0 10*3/uL (ref 0.0–0.1)
Immature Granulocytes: 0 %
Lymphocytes Absolute: 3.2 10*3/uL — ABNORMAL HIGH (ref 0.7–3.1)
Lymphs: 31 %
MCH: 32.6 pg (ref 26.6–33.0)
MCHC: 35.2 g/dL (ref 31.5–35.7)
MCV: 93 fL (ref 79–97)
Monocytes Absolute: 0.8 10*3/uL (ref 0.1–0.9)
Monocytes: 8 %
Neutrophils Absolute: 6.1 10*3/uL (ref 1.4–7.0)
Neutrophils: 58 %
Platelets: 211 10*3/uL (ref 150–450)
RBC: 4.82 x10E6/uL (ref 3.77–5.28)
RDW: 13.1 % (ref 11.7–15.4)
WBC: 10.4 10*3/uL (ref 3.4–10.8)

## 2019-03-14 LAB — LIPID PANEL
Chol/HDL Ratio: 3.7 ratio (ref 0.0–4.4)
Cholesterol, Total: 177 mg/dL (ref 100–199)
HDL: 48 mg/dL (ref >39)
LDL Chol Calc (NIH): 104 mg/dL — ABNORMAL HIGH (ref 0–99)
Triglycerides: 141 mg/dL (ref 0–149)
VLDL Cholesterol Cal: 25 mg/dL (ref 5–40)

## 2019-03-14 LAB — TSH: TSH: 2.64 u[IU]/mL (ref 0.450–4.500)

## 2019-03-17 ENCOUNTER — Telehealth: Payer: Self-pay

## 2019-03-17 NOTE — Telephone Encounter (Signed)
-----   Message from Jerrol Banana., MD sent at 03/16/2019  8:41 AM EST ----- Labs stable

## 2019-03-17 NOTE — Telephone Encounter (Signed)
No answer when called and unable to leave a voicemail.

## 2019-06-06 ENCOUNTER — Other Ambulatory Visit: Payer: Self-pay | Admitting: Family Medicine

## 2019-06-06 DIAGNOSIS — F3289 Other specified depressive episodes: Secondary | ICD-10-CM

## 2019-07-22 ENCOUNTER — Other Ambulatory Visit: Payer: Self-pay | Admitting: Family Medicine

## 2019-07-22 DIAGNOSIS — F3289 Other specified depressive episodes: Secondary | ICD-10-CM

## 2019-09-04 ENCOUNTER — Other Ambulatory Visit: Payer: Self-pay | Admitting: Family Medicine

## 2019-09-04 DIAGNOSIS — F3289 Other specified depressive episodes: Secondary | ICD-10-CM

## 2019-09-13 ENCOUNTER — Other Ambulatory Visit: Payer: Self-pay | Admitting: Family Medicine

## 2019-09-13 NOTE — Telephone Encounter (Signed)
Requested medication (s) are due for refill today: yes  Requested medication (s) are on the active medication list: yes  Last refill:  03/06/19  Future visit scheduled: yes  Notes to clinic:  Med not delegated to NT to RF   Requested Prescriptions  Pending Prescriptions Disp Refills   LORazepam (ATIVAN) 0.5 MG tablet [Pharmacy Med Name: LORAZEPAM 0.5MG  TABLETS] 30 tablet     Sig: TAKE 1 TABLET(0.5 MG) BY MOUTH TWICE DAILY AS NEEDED FOR ANXIETY      Not Delegated - Psychiatry:  Anxiolytics/Hypnotics Failed - 09/13/2019 12:19 PM      Failed - This refill cannot be delegated      Failed - Urine Drug Screen completed in last 360 days.      Failed - Valid encounter within last 6 months    Recent Outpatient Visits           6 months ago Essential (primary) hypertension   [facility] Maple Hudson., MD   11 months ago Class 2 severe obesity due to excess calories with serious comorbidity and body mass index (BMI) of 38.0 to 38.9 in adult [facility])   [facility] Maple Hudson., MD   1 year ago Need for influenza vaccination   [facility] Maple Hudson., MD   1 year ago Shortness of breath   [facility] Maple Hudson., MD   2 years ago Annual physical exam   [facility] Maple Hudson., MD

## 2019-09-15 NOTE — Telephone Encounter (Signed)
Please advise? LOV 03/06/19, upcoming appointment on 10/13/19.

## 2019-10-10 NOTE — Progress Notes (Deleted)
Complete physical exam   Patient: Latoya Cordova   DOB: 04/28/1945   74 y.o. Female  MRN: [id number] Visit Date: 10/13/2019  Today's healthcare provider: Megan Mans, MD   No chief complaint on file.  Subjective    Latoya Cordova is a 74 y.o. female who presents today for a complete physical exam.  She reports consuming a {diet types:17450} diet. {Exercise:19826} She generally feels {well/fairly well/poorly:18703}. She reports sleeping {well/fairly well/poorly:18703}. She {does/does not:200015} have additional problems to discuss today.  HPI  ***  Past Medical History:  Diagnosis Date  . Anxiety   . Diastolic dysfunction    a. echo 08/2014: EF 50-55%, mild MR; b. echo 03/2015: EF 60-65%, RWMA could not be excluded, GR1DD, ascending aorta mildly dilated at 30mm, normal right-sided pressure  . Hyperlipidemia   . Hypertension   . Morbid obesity (HCC)    Past Surgical History:  Procedure Laterality Date  . ABDOMINAL HYSTERECTOMY  08/18/2004  . APPENDECTOMY     Social History   Socioeconomic History  . Marital status: Widowed    Spouse name: Not on file  . Number of children: 1  . Years of education: Not on file  . Highest education level: Associate degree: occupational, Scientist, product/process development, or vocational program  Occupational History  . Occupation: retired  Tobacco Use  . Smoking status: Never Smoker  . Smokeless tobacco: Never Used  Vaping Use  . Vaping Use: Never used  Substance and Sexual Activity  . Alcohol use: No  . Drug use: No  . Sexual activity: Not on file  Other Topics Concern  . Not on file  Social History Narrative  . Not on file   Social Determinants of Health   Financial Resource Strain:   . Difficulty of Paying Living Expenses:   Food Insecurity:   . Worried About Programme researcher, broadcasting/film/video in the Last Year:   . Barista in the Last Year:   Transportation Needs:   . Freight forwarder (Medical):   Marland Kitchen Lack of Transportation  (Non-Medical):   Physical Activity: Inactive  . Days of Exercise per Week: 0 days  . Minutes of Exercise per Session: 0 min  Stress:   . Feeling of Stress :   Social Connections: Unknown  . Frequency of Communication with Friends and Family: Patient refused  . Frequency of Social Gatherings with Friends and Family: Patient refused  . Attends Religious Services: Patient refused  . Active Member of Clubs or Organizations: Patient refused  . Attends Banker Meetings: Patient refused  . Marital Status: Patient refused  Intimate Partner Violence: Unknown  . Fear of Current or Ex-Partner: Patient refused  . Emotionally Abused: Patient refused  . Physically Abused: Patient refused  . Sexually Abused: Patient refused   Family Status  Relation Name Status  . Father  Deceased at age 39       from accident  . PGF  Deceased  . Mother  Deceased at age 31  . Mat Aunt  (Not Specified)  . Cousin  (Not Specified)   Family History  Problem Relation Age of Onset  . Ulcers Father   . Heart attack Paternal Grandfather   . Hypertension Mother   . Congestive Heart Failure Mother   . Breast cancer Maternal Aunt   . Breast cancer Cousin    Allergies  Allergen Reactions  . Sulfa Antibiotics     Per Mother  . Lodine  [Etodolac]  Rash    Abdominal rash.    Patient Care Team: Maple Hudson., MD as PCP - General (Family Medicine) Antonieta Iba, MD as PCP - Cardiology (Cardiology)   Medications: Outpatient Medications Prior to Visit  Medication Sig  . ALPRAZolam (XANAX) 0.5 MG tablet TAKE 1 TABLET(0.5 MG) BY MOUTH AT BEDTIME AS NEEDED FOR ANXIETY  . buPROPion (WELLBUTRIN XL) 150 MG 24 hr tablet TAKE 1 TABLET(150 MG) BY MOUTH DAILY  . butalbital-aspirin-caffeine (FIORINAL) 50-325-40 MG capsule Take 1 capsule by mouth 2 (two) times daily as needed for headache.  . furosemide (LASIX) 20 MG tablet TAKE 1 TABLET(20 MG) BY MOUTH EVERY MORNING  . hydrochlorothiazide  (HYDRODIURIL) 25 MG tablet Take 1 tablet (25 mg total) by mouth daily.  Marland Kitchen HYDROcodone-acetaminophen (NORCO/VICODIN) 5-325 MG tablet Take 1-2 tablets by mouth every 6 (six) hours as needed for moderate pain.  Marland Kitchen levothyroxine (SYNTHROID) 25 MCG tablet TAKE 1 TABLET BY MOUTH ONCE DAILY  . LORazepam (ATIVAN) 0.5 MG tablet TAKE 1 TABLET(0.5 MG) BY MOUTH TWICE DAILY AS NEEDED FOR ANXIETY  . PARoxetine (PAXIL) 40 MG tablet TAKE 1 TABLET(40 MG) BY MOUTH DAILY   No facility-administered medications prior to visit.    Review of Systems  {Heme  Chem  Endocrine  Serology  Results Review (optional):23779::" "}  Objective    There were no vitals taken for this visit. {Show previous vital signs (optional):23777::" "}  Physical Exam  ***  Last depression screening scores PHQ 2/9 Scores 10/03/2018 07/09/2017 02/19/2017  PHQ - 2 Score 1 2 1   PHQ- 9 Score - 8 4   Last fall risk screening Fall Risk  02/19/2019  Falls in the past year? 1  Comment Emmi Telephone Survey: data to providers prior to load  Number falls in past yr: 1  Comment Emmi Telephone Survey Actual Response = 9  Injury with Fall? 0   Last Audit-C alcohol use screening No flowsheet data found. A score of 3 or more in women, and 4 or more in men indicates increased risk for alcohol abuse, EXCEPT if all of the points are from question 1   No results found for any visits on 10/13/19.  Assessment & Plan    Routine Health Maintenance and Physical Exam  Exercise Activities and Dietary recommendations Goals    . Exercise 3x per week (30 min per time)     Recommend to start exercising 3 days a week for at least 30 minutes at a time.        Immunization History  Administered Date(s) Administered  . Fluad Quad(high Dose 65+) 03/06/2019  . Influenza, High Dose Seasonal PF 01/17/2017, 04/04/2018  . Influenza-Unspecified 02/24/2013  . Pneumococcal Conjugate-13 08/08/2013  . Pneumococcal Polysaccharide-23 05/24/2011, 01/20/2017    . Tdap 12/13/2006    Health Maintenance  Topic Date Due  . Hepatitis C Screening  Never done  . COVID-19 Vaccine (1) Never done  . TETANUS/TDAP  12/12/2016  . INFLUENZA VACCINE  10/26/2019  . COLONOSCOPY  08/27/2023  . DEXA SCAN  Completed  . PNA vac Low Risk Adult  Completed    Discussed health benefits of physical activity, and encouraged her to engage in regular exercise appropriate for her age and condition.  ***  No follow-ups on file.     {provider attestation***:1}   10/27/2023, MD  [facility] [phone] (phone) [phone] (fax)  [facility]

## 2019-10-13 ENCOUNTER — Encounter: Payer: Self-pay | Admitting: Family Medicine

## 2019-10-16 NOTE — Progress Notes (Signed)
Latoya Cordova,acting as a scribe for Megan Mans, MD.,have documented all relevant documentation on the behalf of Megan Mans, MD,as directed by  Megan Mans, MD while in the presence of Megan Mans, MD. Annual Wellness Visit     Patient: Latoya Cordova, Female    DOB: 1946-01-24, 74 y.o.   MRN: [id number] Visit Date: 10/20/2019  Today's Provider: Megan Mans, MD   Chief Complaint  Patient presents with  . Annual Exam   Subjective    Latoya Cordova is a 74 y.o. female who presents today for her Annual physical Visit. She reports consuming a general diet. The patient does not participate in regular exercise at present. She generally feels fairly well. She reports sleeping fairly well. She does not have additional problems to discuss today.   HPI  Needs refills on xanax, buproprion, lorazapam, forinal, hydrocodone,   Social History   Tobacco Use  . Smoking status: Never Smoker  . Smokeless tobacco: Never Used  Vaping Use  . Vaping Use: Never used  Substance Use Topics  . Alcohol use: No  . Drug use: No       Medications: Outpatient Medications Prior to Visit  Medication Sig  . ALPRAZolam (XANAX) 0.5 MG tablet TAKE 1 TABLET(0.5 MG) BY MOUTH AT BEDTIME AS NEEDED FOR ANXIETY  . buPROPion (WELLBUTRIN XL) 150 MG 24 hr tablet TAKE 1 TABLET(150 MG) BY MOUTH DAILY  . butalbital-aspirin-caffeine (FIORINAL) 50-325-40 MG capsule Take 1 capsule by mouth 2 (two) times daily as needed for headache.  . furosemide (LASIX) 20 MG tablet TAKE 1 TABLET(20 MG) BY MOUTH EVERY MORNING  . hydrochlorothiazide (HYDRODIURIL) 25 MG tablet Take 1 tablet (25 mg total) by mouth daily.  Marland Kitchen HYDROcodone-acetaminophen (NORCO/VICODIN) 5-325 MG tablet Take 1-2 tablets by mouth every 6 (six) hours as needed for moderate pain.  Marland Kitchen levothyroxine (SYNTHROID) 25 MCG tablet TAKE 1 TABLET BY MOUTH ONCE DAILY  . LORazepam (ATIVAN) 0.5 MG tablet TAKE 1 TABLET(0.5 MG) BY  MOUTH TWICE DAILY AS NEEDED FOR ANXIETY  . PARoxetine (PAXIL) 40 MG tablet TAKE 1 TABLET(40 MG) BY MOUTH DAILY   No facility-administered medications prior to visit.    Allergies  Allergen Reactions  . Sulfa Antibiotics     Per Mother  . Lodine  [Etodolac] Rash    Abdominal rash.    Patient Care Team: Maple Hudson., MD as PCP - General (Family Medicine) Mariah Milling Tollie Pizza, MD as PCP - Cardiology (Cardiology)  Review of Systems  Constitutional: Positive for fatigue.  HENT: Positive for rhinorrhea and sinus pressure.   Eyes: Positive for discharge and itching.  Respiratory: Positive for shortness of breath and wheezing.   Cardiovascular: Negative.   Gastrointestinal: Negative.   Endocrine: Positive for heat intolerance.  Genitourinary: Negative.   Musculoskeletal: Positive for back pain.  Skin: Negative.        Hair loss on top of her head  Allergic/Immunologic: Negative.   Neurological: Positive for headaches.  Hematological: Negative.   Psychiatric/Behavioral: The patient is nervous/anxious.        Objective    Vitals: BP (!) 138/79 (BP Location: Right Arm, Patient Position: Sitting, Cuff Size: Large)   Pulse 105   Temp (!) 97.1 F (36.2 C) (Temporal)   Ht 5\' 4"  (1.626 m)   Wt (!) 217 lb 12.8 oz (98.8 kg)   BMI 37.39 kg/m  BP Readings from Last 3 Encounters:  10/20/19 (!) 138/79  03/06/19 110/70  11/13/18 122/78   Wt Readings from Last 3 Encounters:  10/20/19 (!) 217 lb 12.8 oz (98.8 kg)  03/06/19 219 lb (99.3 kg)  11/13/18 218 lb (98.9 kg)      Physical Exam Vitals reviewed.  Constitutional:      Appearance: She is well-developed. She is obese.  HENT:     Head: Normocephalic and atraumatic.     Right Ear: External ear normal.     Left Ear: External ear normal.     Nose: Nose normal.  Eyes:     General: No scleral icterus.    Conjunctiva/sclera: Conjunctivae normal.  Neck:     Thyroid: No thyromegaly.  Cardiovascular:     Rate and  Rhythm: Normal rate and regular rhythm.     Heart sounds: Normal heart sounds.  Pulmonary:     Effort: Pulmonary effort is normal.     Breath sounds: Normal breath sounds.  Abdominal:     Palpations: Abdomen is soft.  Skin:    General: Skin is warm and dry.  Neurological:     General: No focal deficit present.     Mental Status: She is alert and oriented to person, place, and time.  Psychiatric:        Mood and Affect: Mood normal.        Behavior: Behavior normal.        Thought Content: Thought content normal.        Judgment: Judgment normal.      Most recent functional status assessment: No flowsheet data found. Most recent fall risk assessment: Fall Risk  02/19/2019  Falls in the past year? 1  Comment Emmi Telephone Survey: data to providers prior to load  Number falls in past yr: 1  Comment Emmi Telephone Survey Actual Response = 9  Injury with Fall? 0    Most recent depression screenings: PHQ 2/9 Scores 10/03/2018 07/09/2017  PHQ - 2 Score 1 2  PHQ- 9 Score - 8   Most recent cognitive screening: 6CIT Screen 10/03/2018  What Year? 0 points  What month? 0 points  What time? 0 points  Count back from 20 0 points  Months in reverse 0 points  Repeat phrase 0 points  Total Score 0   Most recent Audit-C alcohol use screening No flowsheet data found. A score of 3 or more in women, and 4 or more in men indicates increased risk for alcohol abuse, EXCEPT if all of the points are from question 1   No results found for any visits on 10/20/19.  Assessment & Plan     Annual wellness visit done today including the all of the following: Reviewed patient's Family Medical History Reviewed and updated list of patient's medical providers Assessment of cognitive impairment was done Assessed patient's functional ability Established a written schedule for health screening services Health Risk Assessent Completed and Reviewed  Exercise Activities and Dietary  recommendations Goals    . Exercise 3x per week (30 min per time)     Recommend to start exercising 3 days a week for at least 30 minutes at a time.        Immunization History  Administered Date(s) Administered  . Fluad Quad(high Dose 65+) 03/06/2019  . Influenza, High Dose Seasonal PF 01/17/2017, 04/04/2018  . Influenza-Unspecified 02/24/2013  . Pneumococcal Conjugate-13 08/08/2013  . Pneumococcal Polysaccharide-23 05/24/2011, 01/20/2017  . Tdap 12/13/2006    Health Maintenance  Topic Date Due  . Hepatitis C Screening  Never done  .  COVID-19 Vaccine (1) Never done  . TETANUS/TDAP  12/12/2016  . INFLUENZA VACCINE  10/26/2019  . COLONOSCOPY  08/27/2023  . DEXA SCAN  Completed  . PNA vac Low Risk Adult  Completed     Discussed health benefits of physical activity, and encouraged her to engage in regular exercise appropriate for her age and condition.   1. Annual physical exam   2. Other depression Consider stopping Paxil in the future. - buPROPion (WELLBUTRIN XL) 150 MG 24 hr tablet; TAKE 1 TABLET(150 MG) BY MOUTH DAILY  Dispense: 90 tablet; Refill: 0  3. Chronic nonintractable headache, unspecified headache type Use Fiorinal infrequently as recommended - butalbital-aspirin-caffeine (FIORINAL) 50-325-40 MG capsule; Take 1 capsule by mouth 2 (two) times daily as needed for headache.  Dispense: 50 capsule; Refill: 2  4. Osteoarthritis, unspecified osteoarthritis type, unspecified site  - HYDROcodone-acetaminophen (NORCO/VICODIN) 5-325 MG tablet; Take 1-2 tablets by mouth every 6 (six) hours as needed for moderate pain.  Dispense: 50 tablet; Refill: 0  5. Shortness of breath ECG reveals normal sinus rhythm with no ST-T wave changes.  This is deconditioning.  May need cardiology referral. - EKG 12-Lead  6. Anxiety, generalized And to cut back on either lorazepam or Ativan. - LORazepam (ATIVAN) 0.5 MG tablet; TAKE 1 TABLET(0.5 MG) BY MOUTH TWICE DAILY AS NEEDED FOR  ANXIETY  Dispense: 30 tablet; Refill: 0  7. Class 2 severe obesity due to excess calories with serious comorbidity and body mass index (BMI) of 38.0 to 38.9 in adult [facility]) With hypertension and osteoarthritis.  8. Essential hypertension On HCTZ.    No follow-ups on file.        Leonilda Cozby Wendelyn Breslow, MD  [facility] [phone] (phone) [phone] (fax)  [facility]

## 2019-10-20 ENCOUNTER — Encounter: Payer: Self-pay | Admitting: Family Medicine

## 2019-10-20 ENCOUNTER — Ambulatory Visit (INDEPENDENT_AMBULATORY_CARE_PROVIDER_SITE_OTHER): Payer: PPO | Admitting: Family Medicine

## 2019-10-20 ENCOUNTER — Other Ambulatory Visit: Payer: Self-pay

## 2019-10-20 VITALS — BP 138/79 | HR 105 | Temp 97.1°F | Ht 64.0 in | Wt 217.8 lb

## 2019-10-20 DIAGNOSIS — R519 Headache, unspecified: Secondary | ICD-10-CM

## 2019-10-20 DIAGNOSIS — Z0000 Encounter for general adult medical examination without abnormal findings: Secondary | ICD-10-CM | POA: Diagnosis not present

## 2019-10-20 DIAGNOSIS — G8929 Other chronic pain: Secondary | ICD-10-CM

## 2019-10-20 DIAGNOSIS — F3289 Other specified depressive episodes: Secondary | ICD-10-CM

## 2019-10-20 DIAGNOSIS — M1990 Unspecified osteoarthritis, unspecified site: Secondary | ICD-10-CM

## 2019-10-20 DIAGNOSIS — I10 Essential (primary) hypertension: Secondary | ICD-10-CM

## 2019-10-20 DIAGNOSIS — Z6838 Body mass index (BMI) 38.0-38.9, adult: Secondary | ICD-10-CM | POA: Diagnosis not present

## 2019-10-20 DIAGNOSIS — F411 Generalized anxiety disorder: Secondary | ICD-10-CM | POA: Diagnosis not present

## 2019-10-20 DIAGNOSIS — R0602 Shortness of breath: Secondary | ICD-10-CM | POA: Diagnosis not present

## 2019-10-24 MED ORDER — BUTALBITAL-ASPIRIN-CAFFEINE 50-325-40 MG PO CAPS: 1.0000 | ORAL_CAPSULE | Freq: Two times a day (BID) | ORAL | 2 refills | Status: DC | PRN

## 2019-10-24 MED ORDER — LORAZEPAM 0.5 MG PO TABS: ORAL_TABLET | ORAL | 0 refills | Status: DC

## 2019-10-24 MED ORDER — BUPROPION HCL ER (XL) 150 MG PO TB24: ORAL_TABLET | ORAL | 0 refills | Status: DC

## 2019-10-24 MED ORDER — HYDROCODONE-ACETAMINOPHEN 5-325 MG PO TABS: 1.0000 | ORAL_TABLET | Freq: Four times a day (QID) | ORAL | 0 refills | Status: DC | PRN

## 2019-10-24 MED ORDER — ALPRAZOLAM 0.5 MG PO TABS: ORAL_TABLET | ORAL | 3 refills | Status: DC

## 2019-12-03 ENCOUNTER — Other Ambulatory Visit: Payer: Self-pay | Admitting: Family Medicine

## 2019-12-03 ENCOUNTER — Other Ambulatory Visit: Payer: Self-pay | Admitting: Cardiovascular Disease

## 2019-12-03 DIAGNOSIS — F3289 Other specified depressive episodes: Secondary | ICD-10-CM

## 2019-12-03 DIAGNOSIS — I10 Essential (primary) hypertension: Secondary | ICD-10-CM

## 2019-12-03 DIAGNOSIS — F411 Generalized anxiety disorder: Secondary | ICD-10-CM

## 2019-12-03 NOTE — Telephone Encounter (Signed)
Requested medication (s) are due for refill today: Yes  Requested medication (s) are on the active medication list: Yes  Last refill:  1 month ago  Future visit scheduled: Yes  Notes to clinic:  Unable to refill per protocol, cannot delegate, failed items on protocol     Requested Prescriptions  Pending Prescriptions Disp Refills   LORazepam (ATIVAN) 0.5 MG tablet [Pharmacy Med Name: LORAZEPAM 0.5MG  TABLETS] 30 tablet     Sig: TAKE 1 TABLET(0.5 MG) BY MOUTH TWICE DAILY AS NEEDED FOR ANXIETY      Not Delegated - Psychiatry:  Anxiolytics/Hypnotics Failed - 12/03/2019  1:02 PM      Failed - This refill cannot be delegated      Failed - Urine Drug Screen completed in last 360 days.      Failed - Valid encounter within last 6 months    Recent Outpatient Visits           1 month ago Annual physical exam   [facility] Maple Hudson., MD   9 months ago Essential (primary) hypertension   [facility] Maple Hudson., MD   1 year ago Class 2 severe obesity due to excess calories with serious comorbidity and body mass index (BMI) of 38.0 to 38.9 in adult [facility])   [facility] Maple Hudson., MD   1 year ago Need for influenza vaccination   [facility] Maple Hudson., MD   1 year ago Shortness of breath   [facility] Maple Hudson., MD       Future Appointments             In 4 months Maple Hudson., MD [facility]

## 2019-12-03 NOTE — Telephone Encounter (Signed)
Requested Prescriptions  Pending Prescriptions Disp Refills  . PARoxetine (PAXIL) 40 MG tablet [Pharmacy Med Name: PAROXETINE 40MG  TABLETS] 90 tablet 0    Sig: TAKE 1 TABLET(40 MG) BY MOUTH DAILY     Psychiatry:  Antidepressants - SSRI Failed - 12/03/2019  6:46 AM      Failed - Valid encounter within last 6 months    Recent Outpatient Visits          1 month ago Annual physical exam   [facility] [place]   9 months ago Essential (primary) hypertension   [facility] [place]   1 year ago Class 2 severe obesity due to excess calories with serious comorbidity and body mass index (BMI) of 38.0 to 38.9 in adult [facility])   [facility] MD   1 year ago Need for influenza vaccination   [facility] [place]   1 year ago Shortness of breath   [facility] MD      Future Appointments            In 4 months Maple Hudson., MD [facility]           Passed - Completed PHQ-2 or PHQ-9 in the last 360 days.

## 2019-12-04 NOTE — Telephone Encounter (Signed)
Please advise? Patient had ov on 10/20/19.

## 2020-01-20 ENCOUNTER — Ambulatory Visit: Payer: PPO | Admitting: Physician Assistant

## 2020-01-20 ENCOUNTER — Telehealth: Payer: Self-pay | Admitting: Physician Assistant

## 2020-01-20 NOTE — Telephone Encounter (Signed)
Patient states she was at the [facility] this morning and fell, states she went to bend down and fell on her knees. Just wanted to let us know , and she had to r/s her appointment for today.

## 2020-01-20 NOTE — Telephone Encounter (Signed)
I called and spoke with the patient. I advised we were just following up to see how she was doing post fall. Per the patient, she was at the [facility] this morning and had squatted to put flowers on her husbands grave.  She lost her balance and ended up scraping her knees a bit. She is mostly just sore right now.   She confirms she did not have pre-syncope/ syncope.   I have advised the patient she should get a small stool to have in the car to sit on when she goes to visit the cemetery. The patient states she had thought the same thing as well.  The patient advised that she is ok.   I advised the patient we will see her back next week as scheduled on 01/28/20.  She voices understanding and is agreeable.

## 2020-01-21 NOTE — Progress Notes (Signed)
Cardiology Office Note    Date:  01/28/2020   ID:  JALAN BODI, DOB 02/06/1946, MRN [id number]  PCP:  Maple Hudson., MD  Cardiologist:  Julien Nordmann, MD  Electrophysiologist:  None   Chief Complaint: Follow up  History of Present Illness:   Latoya Cordova is a 74 y.o. female with history of HFpEF, HTN, obesity, and anxiety who presents for follow up of her HFpEF.   Prior echo from 08/2014 showed an EF of 50-55% and mild mitral regurgitation. She was admitted to the hospital in 03/2015 with acute respiratory distress felt to be secondary to viral pneumonitis vs PNA. Echo at that time showed an EF of 60-65%, mild focal and moderate concentric LVH, LVOT gradient of 41 mmHg with Valsalva, Gr1DD, mildly dilated ascending aorta measuring 37 mm, normal RVSF, normal PASP. CTA chest was negative for PE and there were no significant coronary artery calcifications or aortic atherosclerosis. She was treated with Levaquin with symptoms improvement. She was last seen virtually in 10/2018, noting chronic SOB, "due to weight," and was taking Lasix every other day. There was high fluid intake and it was noted she would eat when bored. No changes were made at that time.  She did have a mechanical fall on 01/20/2020, while squatting down to put flowers on her husband's grave and scraped her knee. No LOC and she did not hit her head.   She comes in and is doing reasonably well from a cardiac perspective.  She does note an approximate 1 to 2-year history of progressive exertional dyspnea.  She reports having to stop and take a break when bringing in groceries up to her house.  There has been no associated chest pain, palpitations, dizziness, presyncope, or syncope associated with exertion.  At baseline she lives a relatively sedentary lifestyle which she feels may be contributing to her underlying dyspnea.  Her weight has been stable.  She denies any lower extremity swelling, abdominal distention,  orthopnea, PND, or early satiety.   Labs independently reviewed: 02/2019 - TC 177, TG 141, HDL 48, LDL 104, TSH normal, BUN 13, SCr 1.13, potassium 3.9, albumin 4.5, AST 42, ALT normal, HGB 15.7, PLT 211  Past Medical History:  Diagnosis Date  . Anxiety   . Diastolic dysfunction    a. echo 08/2014: EF 50-55%, mild MR; b. echo 03/2015: EF 60-65%, RWMA could not be excluded, GR1DD, ascending aorta mildly dilated at 79mm, normal right-sided pressure  . Hyperlipidemia   . Hypertension   . Morbid obesity (HCC)     Past Surgical History:  Procedure Laterality Date  . ABDOMINAL HYSTERECTOMY  08/18/2004  . APPENDECTOMY      Current Medications: Current Meds  Medication Sig  . ALPRAZolam (XANAX) 0.5 MG tablet TAKE 1 TABLET(0.5 MG) BY MOUTH AT BEDTIME AS NEEDED FOR ANXIETY  . buPROPion (WELLBUTRIN XL) 150 MG 24 hr tablet TAKE 1 TABLET(150 MG) BY MOUTH DAILY  . butalbital-aspirin-caffeine (FIORINAL) 50-325-40 MG capsule Take 1 capsule by mouth 2 (two) times daily as needed for headache.  . furosemide (LASIX) 20 MG tablet TAKE 1 TABLET(20 MG) BY MOUTH EVERY MORNING  . hydrochlorothiazide (HYDRODIURIL) 25 MG tablet TAKE 1 TABLET(25 MG) BY MOUTH DAILY  . HYDROcodone-acetaminophen (NORCO/VICODIN) 5-325 MG tablet Take 1-2 tablets by mouth every 6 (six) hours as needed for moderate pain.  Marland Kitchen levothyroxine (SYNTHROID) 25 MCG tablet TAKE 1 TABLET BY MOUTH ONCE DAILY  . LORazepam (ATIVAN) 0.5 MG tablet TAKE 1 TABLET(0.5 MG)  BY MOUTH TWICE DAILY AS NEEDED FOR ANXIETY  . PARoxetine (PAXIL) 40 MG tablet TAKE 1 TABLET(40 MG) BY MOUTH DAILY    Allergies:   Sulfa antibiotics and Lodine  [etodolac]   Social History   Socioeconomic History  . Marital status: Widowed    Spouse name: Not on file  . Number of children: 1  . Years of education: Not on file  . Highest education level: Associate degree: occupational, Scientist, product/process developmenttechnical, or vocational program  Occupational History  . Occupation: retired  Tobacco  Use  . Smoking status: Never Smoker  . Smokeless tobacco: Never Used  Vaping Use  . Vaping Use: Never used  Substance and Sexual Activity  . Alcohol use: No  . Drug use: No  . Sexual activity: Not on file  Other Topics Concern  . Not on file  Social History Narrative  . Not on file   Social Determinants of Health   Financial Resource Strain:   . Difficulty of Paying Living Expenses: Not on file  Food Insecurity:   . Worried About Programme researcher, broadcasting/film/videounning Out of Food in the Last Year: Not on file  . Ran Out of Food in the Last Year: Not on file  Transportation Needs:   . Lack of Transportation (Medical): Not on file  . Lack of Transportation (Non-Medical): Not on file  Physical Activity:   . Days of Exercise per Week: Not on file  . Minutes of Exercise per Session: Not on file  Stress:   . Feeling of Stress : Not on file  Social Connections:   . Frequency of Communication with Friends and Family: Not on file  . Frequency of Social Gatherings with Friends and Family: Not on file  . Attends Religious Services: Not on file  . Active Member of Clubs or Organizations: Not on file  . Attends BankerClub or Organization Meetings: Not on file  . Marital Status: Not on file     Family History:  The patient's family history includes Breast cancer in her cousin and maternal aunt; Congestive Heart Failure in her mother; Heart attack in her paternal grandfather; Hypertension in her mother; Ulcers in her father.  ROS:   Review of Systems  Constitutional: Positive for malaise/fatigue. Negative for chills, diaphoresis, fever and weight loss.  HENT: Negative for congestion.   Eyes: Negative for discharge and redness.  Respiratory: Positive for shortness of breath. Negative for cough, sputum production and wheezing.   Cardiovascular: Negative for chest pain, palpitations, orthopnea, claudication, leg swelling and PND.  Gastrointestinal: Negative for abdominal pain, heartburn, nausea and vomiting.    Musculoskeletal: Positive for falls. Negative for myalgias.  Skin: Negative for rash.  Neurological: Positive for weakness. Negative for dizziness, tingling, tremors, sensory change, speech change, focal weakness and loss of consciousness.  Endo/Heme/Allergies: Does not bruise/bleed easily.  Psychiatric/Behavioral: Negative for substance abuse. The patient is not nervous/anxious.   All other systems reviewed and are negative.    EKGs/Labs/Other Studies Reviewed:    Studies reviewed were summarized above. The additional studies were reviewed today:  2D echo 08/2014: - Left ventricle: The cavity size was normal. Systolic function was  normal. The estimated ejection fraction was in the range of 50%  to 55%.  - Mitral valve: There was mild regurgitation. __________  2D echo 03/2015: - Left ventricle: The cavity size was normal. There was mild focal  basal and moderate concentric hypertrophy of the septum. There is  LVOT gradient of 41 mm Hg with valsalva (not well visualized)  Systolic function was normal. The estimated ejection fraction was  in the range of 60% to 65%. Regional wall motion abnormalities  cannot be excluded. Doppler parameters are consistent with  abnormal left ventricular relaxation (grade 1 diastolic  dysfunction).  - Aorta: Ascending aorta is mildly dilated, diameter: 37 mm (S).  - Left atrium: The atrium was normal in size.  - Right ventricle: Systolic function was normal.  - Pulmonary arteries: Systolic pressure was within the normal  range.   Impressions:   - Challenging image quality.   EKG:  EKG is ordered today.  The EKG ordered today demonstrates NSR, 96 bpm, left axis deviation, LVH, poor R wave progression along the precordial leads, baseline wandering, no acute ST-T changes  Recent Labs: 03/13/2019: ALT 28; BUN 13; Creatinine, Ser 1.13; Hemoglobin 15.7; Platelets 211; Potassium 3.9; Sodium 139; TSH 2.640  Recent Lipid Panel     Component Value Date/Time   CHOL 177 03/13/2019 1206   TRIG 141 03/13/2019 1206   HDL 48 03/13/2019 1206   CHOLHDL 3.7 03/13/2019 1206   CHOLHDL 3.8 02/19/2017 1445   LDLCALC 104 (H) 03/13/2019 1206   LDLCALC 109 (H) 02/19/2017 1445    PHYSICAL EXAM:    VS:  BP (!) 138/100 (BP Location: Left Arm, Patient Position: Sitting, Cuff Size: Normal)   Pulse 97   Ht 5\' 2"  (1.575 m)   Wt 217 lb 8 oz (98.7 kg)   SpO2 96%   BMI 39.78 kg/m   BMI: Body mass index is 39.78 kg/m.  Physical Exam Constitutional:      Appearance: She is well-developed.  HENT:     Head: Normocephalic and atraumatic.  Eyes:     General:        Right eye: No discharge.        Left eye: No discharge.  Neck:     Vascular: No JVD.  Cardiovascular:     Rate and Rhythm: Normal rate and regular rhythm.     Pulses: No midsystolic click and no opening snap.          Posterior tibial pulses are 2+ on the right side and 2+ on the left side.     Heart sounds: Normal heart sounds, S1 normal and S2 normal. Heart sounds not distant. No murmur heard.  No friction rub.  Pulmonary:     Effort: Pulmonary effort is normal. No respiratory distress.     Breath sounds: Normal breath sounds. No decreased breath sounds, wheezing or rales.  Chest:     Chest wall: No tenderness.  Abdominal:     General: There is no distension.     Palpations: Abdomen is soft.     Tenderness: There is no abdominal tenderness.  Musculoskeletal:     Cervical back: Normal range of motion.  Skin:    General: Skin is warm and dry.     Nails: There is no clubbing.  Neurological:     Mental Status: She is alert and oriented to person, place, and time.  Psychiatric:        Speech: Speech normal.        Behavior: Behavior normal.        Thought Content: Thought content normal.        Judgment: Judgment normal.     Wt Readings from Last 3 Encounters:  01/28/20 217 lb 8 oz (98.7 kg)  10/20/19 (!) 217 lb 12.8 oz (98.8 kg)  03/06/19 219 lb  (99.3 kg)  ASSESSMENT & PLAN:   1. HFpEF with exertional dyspnea: Volume status is somewhat difficult to assess on exam secondary to body habitus though she does appear euvolemic and well compensated.  Weight is stable.  Suspect a fair component of her exertional dyspnea is related to significant physical deconditioning with sedentary lifestyle and morbid obesity.  We will update an echo to evaluate for new cardiomyopathy, valvulopathy, PA pressure, or wall motion abnormalities.  Could consider Lexiscan MPI to evaluate for high risk ischemia and follow-up.  Continue Lasix.  Low-sodium diet recommended.  2. HTN: Blood pressure is mildly elevated in the office today though she does eat a diet high in sodium.  Discussed dietary changes in detail.  She remains on HCTZ.  3. Morbid obesity/physical deconditioning: Weight loss is advised.  She has previously been referred to physical therapy.  Disposition: F/u with Dr. Mariah Cordova or an APP after echo.   Medication Adjustments/Labs and Tests Ordered: Current medicines are reviewed at length with the patient today.  Concerns regarding medicines are outlined above. Medication changes, Labs and Tests ordered today are summarized above and listed in the Patient Instructions accessible in Encounters.   Signed, Eula Listen, PA-C 01/28/2020 11:19 AM     [facility] [address] [place], [place] [address] [phone]

## 2020-01-28 ENCOUNTER — Encounter: Payer: Self-pay | Admitting: Physician Assistant

## 2020-01-28 ENCOUNTER — Other Ambulatory Visit: Payer: Self-pay

## 2020-01-28 ENCOUNTER — Ambulatory Visit: Payer: PPO | Admitting: Physician Assistant

## 2020-01-28 VITALS — BP 138/100 | HR 97 | Ht 62.0 in | Wt 217.5 lb

## 2020-01-28 DIAGNOSIS — R0602 Shortness of breath: Secondary | ICD-10-CM

## 2020-01-28 DIAGNOSIS — I10 Essential (primary) hypertension: Secondary | ICD-10-CM | POA: Diagnosis not present

## 2020-01-28 DIAGNOSIS — R5381 Other malaise: Secondary | ICD-10-CM | POA: Diagnosis not present

## 2020-01-28 DIAGNOSIS — I5032 Chronic diastolic (congestive) heart failure: Secondary | ICD-10-CM | POA: Diagnosis not present

## 2020-01-28 NOTE — Patient Instructions (Signed)
Medication Instructions:  Your physician recommends that you continue on your current medications as directed. Please refer to the Current Medication list given to you today.  *If you need a refill on your cardiac medications before your next appointment, please call your pharmacy*   Lab Work: None ordered.  If you have labs (blood work) drawn today and your tests are completely normal, you will receive your results only by: Marland Kitchen MyChart Message (if you have MyChart) OR . A paper copy in the mail If you have any lab test that is abnormal or we need to change your treatment, we will call you to review the results.   Testing/Procedures:  Your physician has requested that you have an echocardiogram. Echocardiography is a painless test that uses sound waves to create images of your heart. It provides your doctor with information about the size and shape of your heart and how well your heart's chambers and valves are working. This procedure takes approximately one hour. There are no restrictions for this procedure.   Follow-Up: At [facility], you and your health needs are our priority.  As part of our continuing mission to provide you with exceptional heart care, we have created designated Provider Care Teams.  These Care Teams include your primary Cardiologist (physician) and Advanced Practice Providers (APPs -  Physician Assistants and Nurse Practitioners) who all work together to provide you with the care you need, when you need it.  We recommend signing up for the patient portal called "MyChart".  Sign up information is provided on this After Visit Summary.  MyChart is used to connect with patients for Virtual Visits (Telemedicine).  Patients are able to view lab/test results, encounter notes, upcoming appointments, etc.  Non-urgent messages can be sent to your provider as well.   To learn more about what you can do with MyChart, go to [email or web address].    Your next appointment:    Follow up AFTER the Echocardiogram.  The format for your next appointment:   In Person  Provider:   You may see Julien Nordmann, MD or one of the following Advanced Practice Providers on your designated Care Team:    Nicolasa Ducking, NP  Eula Listen, PA-C  Marisue Ivan, PA-C  Cadence Fransico Michael, [place]    Other Instructions None.

## 2020-01-29 ENCOUNTER — Other Ambulatory Visit: Payer: Self-pay | Admitting: Family Medicine

## 2020-01-29 DIAGNOSIS — F3289 Other specified depressive episodes: Secondary | ICD-10-CM

## 2020-02-10 ENCOUNTER — Other Ambulatory Visit: Payer: PPO

## 2020-02-11 ENCOUNTER — Other Ambulatory Visit: Payer: Self-pay

## 2020-02-11 ENCOUNTER — Ambulatory Visit (INDEPENDENT_AMBULATORY_CARE_PROVIDER_SITE_OTHER): Payer: PPO

## 2020-02-11 DIAGNOSIS — R0602 Shortness of breath: Secondary | ICD-10-CM | POA: Diagnosis not present

## 2020-02-11 LAB — ECHOCARDIOGRAM COMPLETE
AR max vel: 2.96 cm2
AV Area VTI: 2.64 cm2
AV Area mean vel: 2.5 cm2
AV Mean grad: 4 mmHg
AV Peak grad: 6 mmHg
Ao pk vel: 1.22 m/s
Area-P 1/2: 7.74 cm2

## 2020-02-11 MED ORDER — PERFLUTREN LIPID MICROSPHERE
1.0000 mL | INTRAVENOUS | Status: AC | PRN
  Administered 2020-02-11: 2 mL via INTRAVENOUS

## 2020-02-23 NOTE — Progress Notes (Signed)
Cardiology Office Note    Date:  02/24/2020   ID:  Latoya Cordova, DOB September 27, 1945, MRN [id number]  PCP:  Latoya Cordova., MD  Cardiologist:  Latoya Nordmann, MD  Electrophysiologist:  None   Chief Complaint: Follow-up  History of Present Illness:   Latoya Cordova is a 74 y.o. female with history of HFpEF, HTN, obesity, and anxiety who presents for follow up of her HFpEF.   Prior echo from 08/2014 showed an EF of 50-55% and mild mitral regurgitation. She was admitted to the hospital in 03/2015 with acute respiratory distress felt to be secondary to viral pneumonitis vs PNA. Echo at that time showed an EF of 60-65%, mild focal and moderate concentric LVH, LVOT gradient of 41 mmHg with Valsalva, Gr1DD, mildly dilated ascending aorta measuring 37 mm, normal RVSF and PASP. CTA chest was negative for PE and there were no significant coronary artery calcifications or aortic atherosclerosis noted. She was treated with Levaquin with symptom improvement. She was seen virtually in 10/2018, noting chronic SOB, "due to weight," and was taking Lasix every other day. There was high fluid intake and it was noted she would eat when bored. No changes were made at that time.  She did have a mechanical fall on 01/20/2020, while squatting down to put flowers on her husband's grave and scraped her knee. No LOC and she did not hit her head.  She was last seen in the office on 01/28/2020 and was doing well from a cardiac perspective.  She noted a 1 to 2-year history of progressive exertional dyspnea.  She did feel like her sedentary lifestyle was contributing to this.  Echo on 02/11/2020 showed an EF of 55 to 60%, no regional wall motion abnormalities, grade 1 diastolic dysfunction, normal RV systolic function, estimated right atrial pressure of 3 mmHg, and no significant valvular abnormalities.  She comes in today and is doing well from a cardiac perspective.  She continues to note stable 1 to 2-year history of  progressive exertional dyspnea without chest pain, palpitations, dizziness, presyncope, syncope, lower extremity swelling, abdominal tension, orthopnea, PND, early satiety.  She continues to live a sedentary lifestyle which she feels is contributing to her dyspnea.  She would like to start a walking regimen in the near future.  She has not had any falls since she was last seen.   Labs independently reviewed: 02/2019 - TC 177, TG 141, HDL 48, LDL 104, TSH normal, BUN 13, SCr 1.13, potassium 3.9, albumin 4.5, AST 42, ALT normal, HGB 15.7, PLT 211    Past Medical History:  Diagnosis Date  . Anxiety   . Diastolic dysfunction    a. echo 08/2014: EF 50-55%, mild MR; b. echo 03/2015: EF 60-65%, RWMA could not be excluded, GR1DD, ascending aorta mildly dilated at 5mm, normal right-sided pressure  . Hyperlipidemia   . Hypertension   . Morbid obesity (HCC)     Past Surgical History:  Procedure Laterality Date  . ABDOMINAL HYSTERECTOMY  08/18/2004  . APPENDECTOMY      Current Medications: Current Meds  Medication Sig  . ALPRAZolam (XANAX) 0.5 MG tablet TAKE 1 TABLET(0.5 MG) BY MOUTH AT BEDTIME AS NEEDED FOR ANXIETY  . buPROPion (WELLBUTRIN XL) 150 MG 24 hr tablet TAKE 1 TABLET(150 MG) BY MOUTH DAILY  . butalbital-aspirin-caffeine (FIORINAL) 50-325-40 MG capsule Take 1 capsule by mouth 2 (two) times daily as needed for headache.  . furosemide (LASIX) 20 MG tablet TAKE 1 TABLET(20 MG) BY MOUTH  EVERY MORNING  . hydrochlorothiazide (HYDRODIURIL) 25 MG tablet TAKE 1 TABLET(25 MG) BY MOUTH DAILY  . HYDROcodone-acetaminophen (NORCO/VICODIN) 5-325 MG tablet Take 1-2 tablets by mouth every 6 (six) hours as needed for moderate pain.  Marland Kitchen. levothyroxine (SYNTHROID) 25 MCG tablet TAKE 1 TABLET BY MOUTH ONCE DAILY  . LORazepam (ATIVAN) 0.5 MG tablet TAKE 1 TABLET(0.5 MG) BY MOUTH TWICE DAILY AS NEEDED FOR ANXIETY  . PARoxetine (PAXIL) 40 MG tablet TAKE 1 TABLET(40 MG) BY MOUTH DAILY    Allergies:   Sulfa  antibiotics and Lodine  [etodolac]   Social History   Socioeconomic History  . Marital status: Widowed    Spouse name: Not on file  . Number of children: 1  . Years of education: Not on file  . Highest education level: Associate degree: occupational, Scientist, product/process developmenttechnical, or vocational program  Occupational History  . Occupation: retired  Tobacco Use  . Smoking status: Never Smoker  . Smokeless tobacco: Never Used  Vaping Use  . Vaping Use: Never used  Substance and Sexual Activity  . Alcohol use: No  . Drug use: No  . Sexual activity: Not on file  Other Topics Concern  . Not on file  Social History Narrative  . Not on file   Social Determinants of Health   Financial Resource Strain:   . Difficulty of Paying Living Expenses: Not on file  Food Insecurity:   . Worried About Programme researcher, broadcasting/film/videounning Out of Food in the Last Year: Not on file  . Ran Out of Food in the Last Year: Not on file  Transportation Needs:   . Lack of Transportation (Medical): Not on file  . Lack of Transportation (Non-Medical): Not on file  Physical Activity:   . Days of Exercise per Week: Not on file  . Minutes of Exercise per Session: Not on file  Stress:   . Feeling of Stress : Not on file  Social Connections:   . Frequency of Communication with Friends and Family: Not on file  . Frequency of Social Gatherings with Friends and Family: Not on file  . Attends Religious Services: Not on file  . Active Member of Clubs or Organizations: Not on file  . Attends BankerClub or Organization Meetings: Not on file  . Marital Status: Not on file     Family History:  The patient's family history includes Breast cancer in her cousin and maternal aunt; Congestive Heart Failure in her mother; Heart attack in her paternal grandfather; Hypertension in her mother; Ulcers in her father.  ROS:   Review of Systems  Constitutional: Positive for malaise/fatigue. Negative for chills, diaphoresis, fever and weight loss.  HENT: Negative for  congestion.   Eyes: Negative for discharge and redness.  Respiratory: Positive for shortness of breath. Negative for cough, sputum production and wheezing.   Cardiovascular: Negative for chest pain, palpitations, orthopnea, claudication, leg swelling and PND.  Gastrointestinal: Negative for abdominal pain, heartburn, nausea and vomiting.  Musculoskeletal: Negative for falls and myalgias.  Skin: Negative for rash.  Neurological: Negative for dizziness, tingling, tremors, sensory change, speech change, focal weakness, loss of consciousness and weakness.  Endo/Heme/Allergies: Does not bruise/bleed easily.  Psychiatric/Behavioral: Negative for substance abuse. The patient is not nervous/anxious.   All other systems reviewed and are negative.    EKGs/Labs/Other Studies Reviewed:    Studies reviewed were summarized above. The additional studies were reviewed today:  2D echo 08/2014: - Left ventricle: The cavity size was normal. Systolic function was  normal. The estimated ejection  fraction was in the range of 50%  to 55%.  - Mitral valve: There was mild regurgitation. __________  2D echo 03/2015: - Left ventricle: The cavity size was normal. There was mild focal  basal and moderate concentric hypertrophy of the septum. There is  LVOT gradient of 41 mm Hg with valsalva (not well visualized)  Systolic function was normal. The estimated ejection fraction was  in the range of 60% to 65%. Regional wall motion abnormalities  cannot be excluded. Doppler parameters are consistent with  abnormal left ventricular relaxation (grade 1 diastolic  dysfunction).  - Aorta: Ascending aorta is mildly dilated, diameter: 37 mm (S).  - Left atrium: The atrium was normal in size.  - Right ventricle: Systolic function was normal.  - Pulmonary arteries: Systolic pressure was within the normal  range.   Impressions:   - Challenging image quality.   EKG:  EKG is not ordered today.     Recent Labs: 03/13/2019: ALT 28; BUN 13; Creatinine, Ser 1.13; Hemoglobin 15.7; Platelets 211; Potassium 3.9; Sodium 139; TSH 2.640  Recent Lipid Panel    Component Value Date/Time   CHOL 177 03/13/2019 1206   TRIG 141 03/13/2019 1206   HDL 48 03/13/2019 1206   CHOLHDL 3.7 03/13/2019 1206   CHOLHDL 3.8 02/19/2017 1445   LDLCALC 104 (H) 03/13/2019 1206   LDLCALC 109 (H) 02/19/2017 1445    PHYSICAL EXAM:    VS:  BP 120/76   Pulse 90   Ht 5\' 2"  (1.575 m)   Wt 217 lb (98.4 kg)   BMI 39.69 kg/m   BMI: Body mass index is 39.69 kg/m.  Physical Exam Constitutional:      Appearance: She is well-developed.  HENT:     Head: Normocephalic and atraumatic.  Eyes:     General:        Right eye: No discharge.        Left eye: No discharge.  Neck:     Vascular: No JVD.  Cardiovascular:     Rate and Rhythm: Normal rate and regular rhythm.     Pulses: No midsystolic click and no opening snap.          Posterior tibial pulses are 2+ on the right side and 2+ on the left side.     Heart sounds: Normal heart sounds, S1 normal and S2 normal. Heart sounds not distant. No murmur heard.  No friction rub.  Pulmonary:     Effort: Pulmonary effort is normal. No respiratory distress.     Breath sounds: Normal breath sounds. No decreased breath sounds, wheezing or rales.  Chest:     Chest wall: No tenderness.  Abdominal:     General: There is no distension.     Palpations: Abdomen is soft.     Tenderness: There is no abdominal tenderness.  Musculoskeletal:     Cervical back: Normal range of motion.  Skin:    General: Skin is warm and dry.     Nails: There is no clubbing.  Neurological:     Mental Status: She is alert and oriented to person, place, and time.  Psychiatric:        Speech: Speech normal.        Behavior: Behavior normal.        Thought Content: Thought content normal.        Judgment: Judgment normal.     Wt Readings from Last 3 Encounters:  02/24/20 217 lb (98.4  kg)  01/28/20 217  lb 8 oz (98.7 kg)  10/20/19 (!) 217 lb 12.8 oz (98.8 kg)     ASSESSMENT & PLAN:   1. Exertional dyspnea: Echo was reassuring and stable as outlined above.  Schedule Lexiscan MPI to evaluate for high risk ischemia.  Likely multifactorial including morbid obesity and physical deconditioning.  Lexiscan is low risk and no further cardiac testing is indicated at this time.  2. HFpEF: Volume status is somewhat difficult to assess on exam secondary to body habitus though she does appear euvolemic and well compensated.  Her weight is stable.  Echo reassuring as outlined above.  She remains on low-dose Lasix.  Sodium restriction is advised.  3. HTN: Blood pressure is well controlled in the office today.  She remains on HCTZ.  4. Morbid obesity/physical deconditioning: Likely contributing to her symptoms of shortness of breath.  Weight loss is advised.  She has previously been referred to physical therapy.  Disposition: F/u with Dr. Mariah Milling or an APP in 1 month.   Medication Adjustments/Labs and Tests Ordered: Current medicines are reviewed at length with the patient today.  Concerns regarding medicines are outlined above. Medication changes, Labs and Tests ordered today are summarized above and listed in the Patient Instructions accessible in Encounters.   Signed, Eula Listen, PA-C 02/24/2020 3:45 PM     [facility] [place] [address] [place], [place] [address] [phone]

## 2020-02-24 ENCOUNTER — Encounter: Payer: Self-pay | Admitting: Physician Assistant

## 2020-02-24 ENCOUNTER — Ambulatory Visit: Payer: PPO | Admitting: Physician Assistant

## 2020-02-24 ENCOUNTER — Other Ambulatory Visit: Payer: Self-pay

## 2020-02-24 VITALS — BP 120/76 | HR 90 | Ht 62.0 in | Wt 217.0 lb

## 2020-02-24 DIAGNOSIS — R5381 Other malaise: Secondary | ICD-10-CM

## 2020-02-24 DIAGNOSIS — R0600 Dyspnea, unspecified: Secondary | ICD-10-CM | POA: Diagnosis not present

## 2020-02-24 DIAGNOSIS — E782 Mixed hyperlipidemia: Secondary | ICD-10-CM | POA: Diagnosis not present

## 2020-02-24 DIAGNOSIS — I10 Essential (primary) hypertension: Secondary | ICD-10-CM

## 2020-02-24 DIAGNOSIS — I5032 Chronic diastolic (congestive) heart failure: Secondary | ICD-10-CM

## 2020-02-24 DIAGNOSIS — R0609 Other forms of dyspnea: Secondary | ICD-10-CM

## 2020-02-24 NOTE — Patient Instructions (Signed)
Medication Instructions:  Your physician recommends that you continue on your current medications as directed. Please refer to the Current Medication list given to you today.  *If you need a refill on your cardiac medications before your next appointment, please call your pharmacy*   Lab Work: None Ordered If you have labs (blood work) drawn today and your tests are completely normal, you will receive your results only by: Marland Kitchen MyChart Message (if you have MyChart) OR . A paper copy in the mail If you have any lab test that is abnormal or we need to change your treatment, we will call you to review the results.   Testing/Procedures:  [facility] MYOVIEW     Your caregiver has ordered a Stress Test with nuclear imaging. The purpose of this test is to evaluate the blood supply to your heart muscle. This procedure is referred to as a "Non-Invasive Stress Test." This is because other than having an IV started in your vein, nothing is inserted or "invades" your body. Cardiac stress tests are done to find areas of poor blood flow to the heart by determining the extent of coronary artery disease (CAD). Some patients exercise on a treadmill, which naturally increases the blood flow to your heart, while others who are  unable to walk on a treadmill due to physical limitations have a pharmacologic/chemical stress agent called Lexiscan . This medicine will mimic walking on a treadmill by temporarily increasing your coronary blood flow.      PLEASE REPORT TO [facility] MEDICAL MALL ENTRANCE   THE VOLUNTEERS AT THE FIRST DESK WILL DIRECT YOU WHERE TO GO     *Please note: these test may take anywhere between 2-4 hours to complete       Date of Procedure:_____________________________________   Arrival Time for Procedure:______________________________    PLEASE NOTIFY THE OFFICE AT LEAST 24 HOURS IN ADVANCE IF YOU ARE UNABLE TO KEEP YOUR APPOINTMENT.  [phone]  PLEASE NOTIFY NUCLEAR MEDICINE AT [facility] AT LEAST  24 HOURS IN ADVANCE IF YOU ARE UNABLE TO KEEP YOUR APPOINTMENT. [phone]         How to prepare for your Myoview test:         ____:  Hold diabetes medication the morning of procedure:   ____:  Hold betablocker(s) night before procedure and morning of procedure:     _XX___:  Hold other medications as follows: furosemide (LASIX), hydrochlorothiazide (HYDRODIURIL)    1. Do not eat or drink after midnight  2. No caffeine for 24 hours prior to test  3. No smoking 24 hours prior to test.  4. Unless instructed otherwise, Take your medication with a small sips of water.    5.         Ladies, please do not wear dresses. Skirts or pants are appropriate. Please wear a short sleeve shirt.  6. No perfume, cologne or lotion.  7. Wear comfortable walking shoes. No heels!     Follow-Up: At [facility], you and your health needs are our priority.  As part of our continuing mission to provide you with exceptional heart care, we have created designated Provider Care Teams.  These Care Teams include your primary Cardiologist (physician) and Advanced Practice Providers (APPs -  Physician Assistants and Nurse Practitioners) who all work together to provide you with the care you need, when you need it.  We recommend signing up for the patient portal called "MyChart".  Sign up information is provided on this After Visit Summary.  MyChart  is used to connect with patients for Virtual Visits (Telemedicine).  Patients are able to view lab/test results, encounter notes, upcoming appointments, etc.  Non-urgent messages can be sent to your provider as well.   To learn more about what you can do with MyChart, go to [email or web address].    Your next appointment:   1 month(s)  The format for your next appointment:   In Person  Provider:   You may see Julien Nordmann, MD or one of the following Advanced Practice Providers on your designated Care Team:    Nicolasa Ducking, NP  Eula Listen,  PA-C  Marisue Ivan, PA-C  Cadence [place], [place]  Gillian Shields, NP    Other Instructions

## 2020-03-01 ENCOUNTER — Other Ambulatory Visit: Payer: Self-pay

## 2020-03-01 ENCOUNTER — Encounter
Admission: RE | Admit: 2020-03-01 | Discharge: 2020-03-01 | Disposition: A | Discharge status: Home / Self Care | Payer: PPO | Source: Ambulatory Visit | Attending: Physician Assistant | Admitting: Physician Assistant

## 2020-03-01 DIAGNOSIS — R0609 Other forms of dyspnea: Secondary | ICD-10-CM

## 2020-03-01 DIAGNOSIS — R0600 Dyspnea, unspecified: Secondary | ICD-10-CM | POA: Insufficient documentation

## 2020-03-01 LAB — NM MYOCAR MULTI W/SPECT W/WALL MOTION / EF
Peak HR: 105 {beats}/min
Percent HR: 72 %
Rest HR: 92 {beats}/min

## 2020-03-01 MED ORDER — REGADENOSON 0.4 MG/5ML IV SOLN
0.4000 mg | Freq: Once | INTRAVENOUS | Status: AC
  Administered 2020-03-01: 0.4 mg via INTRAVENOUS

## 2020-03-01 MED ORDER — TECHNETIUM TC 99M TETROFOSMIN IV KIT
30.0000 | PACK | Freq: Once | INTRAVENOUS | Status: AC | PRN
  Administered 2020-03-01: 31.656 via INTRAVENOUS

## 2020-03-01 MED ORDER — TECHNETIUM TC 99M TETROFOSMIN IV KIT
10.5500 | PACK | Freq: Once | INTRAVENOUS | Status: AC | PRN
  Administered 2020-03-01: 10.55 via INTRAVENOUS

## 2020-03-02 ENCOUNTER — Other Ambulatory Visit: Payer: Self-pay | Admitting: Cardiovascular Disease

## 2020-03-02 ENCOUNTER — Other Ambulatory Visit: Payer: Self-pay | Admitting: Family Medicine

## 2020-03-02 DIAGNOSIS — I10 Essential (primary) hypertension: Secondary | ICD-10-CM

## 2020-03-02 DIAGNOSIS — E038 Other specified hypothyroidism: Secondary | ICD-10-CM

## 2020-03-02 DIAGNOSIS — F3289 Other specified depressive episodes: Secondary | ICD-10-CM

## 2020-04-02 ENCOUNTER — Ambulatory Visit: Payer: PPO | Admitting: Cardiovascular Disease

## 2020-04-05 NOTE — Progress Notes (Deleted)
Cardiology Office Note    Date:  04/05/2020   ID:  HELYNE GENTHER, DOB 1945-06-08, MRN [id number]  PCP:  Maple Hudson., MD  Cardiologist:  Julien Nordmann, MD  Electrophysiologist:  None   Chief Complaint: Follow up  History of Present Illness:   Latoya Cordova is a 75 y.o. female with history of HFpEF, HTN, obesity, and anxiety who presents for follow up of her HFpEF.   Prior echo from 08/2014 showed an EF of 50-55% and mild mitral regurgitation. She was admitted to the hospital in 03/2015 with acute respiratory distress felt to be secondary to viral pneumonitis vs PNA. Echo at that time showed an EF of 60-65%, mild focal and moderate concentric LVH, LVOT gradient of 41 mmHg with Valsalva, Gr1DD, mildly dilated ascending aorta measuring 37 mm, normal RVSF and PASP. CTA chest was negative for PE and there were no significant coronary artery calcifications or aortic atherosclerosis noted. She was treated with Levaquin with symptom improvement. She was seen virtually in 10/2018, noting chronic SOB, "due to weight," and was taking Lasix every other day. There was high fluid intake and it was noted she would eat when bored. No changes were made at that time. She did have a mechanical fall on 01/20/2020, while squatting down to put flowers on her husband's grave and scraped her knee. No LOC and she did not hit her head.  She was seen in the office on 01/28/2020 and was doing well from a cardiac perspective.  She noted a 1 to 2-year history of progressive exertional dyspnea.  She did feel like her sedentary lifestyle was contributing to this.  Echo on 02/11/2020 showed an EF of 55 to 60%, no regional wall motion abnormalities, grade 1 diastolic dysfunction, normal RV systolic function, estimated right atrial pressure of 3 mmHg, and no significant valvular abnormalities.  She was last seen in the office on 02/24/2020 and was doing well from a cardiac perspective.  She continued to note a stable 1  to 2-year history of progressive exertional dyspnea without chest pain.  In this setting she underwent Lexiscan MPI on 03/01/2020 which showed no significant ischemia or scar and was overall low risk.  CT attenuation corrected images showed evidence of aortic atherosclerosis with minimal coronary artery calcification.  ***   Labs independently reviewed: 02/2019 - TC 177, TG 141, HDL 48, LDL 104, TSH normal, BUN 13, SCr 1.13, potassium 3.9, albumin 4.5, AST 42, ALT normal, HGB 15.7, PLT 211   Past Medical History:  Diagnosis Date  . Anxiety   . Diastolic dysfunction    a. echo 08/2014: EF 50-55%, mild MR; b. echo 03/2015: EF 60-65%, RWMA could not be excluded, GR1DD, ascending aorta mildly dilated at 52mm, normal right-sided pressure  . Hyperlipidemia   . Hypertension   . Morbid obesity (HCC)     Past Surgical History:  Procedure Laterality Date  . ABDOMINAL HYSTERECTOMY  08/18/2004  . APPENDECTOMY      Current Medications: No outpatient medications have been marked as taking for the 04/09/20 encounter (Appointment) with Sondra Barges, PA-C.    Allergies:   Sulfa antibiotics and Lodine  [etodolac]   Social History   Socioeconomic History  . Marital status: Widowed    Spouse name: Not on file  . Number of children: 1  . Years of education: Not on file  . Highest education level: Associate degree: occupational, Scientist, product/process development, or vocational program  Occupational History  . Occupation: retired  Tobacco Use  . Smoking status: Never Smoker  . Smokeless tobacco: Never Used  Vaping Use  . Vaping Use: Never used  Substance and Sexual Activity  . Alcohol use: No  . Drug use: No  . Sexual activity: Not on file  Other Topics Concern  . Not on file  Social History Narrative  . Not on file   Social Determinants of Health   Financial Resource Strain: Not on file  Food Insecurity: Not on file  Transportation Needs: Not on file  Physical Activity: Not on file  Stress: Not on file   Social Connections: Not on file     Family History:  The patient's family history includes Breast cancer in her cousin and maternal aunt; Congestive Heart Failure in her mother; Heart attack in her paternal grandfather; Hypertension in her mother; Ulcers in her father.  ROS:   ROS   EKGs/Labs/Other Studies Reviewed:    Studies reviewed were summarized above. The additional studies were reviewed today:  2D echo 08/2014: - Left ventricle: The cavity size was normal. Systolic function was  normal. The estimated ejection fraction was in the range of 50%  to 55%.  - Mitral valve: There was mild regurgitation. __________  2D echo 03/2015: - Left ventricle: The cavity size was normal. There was mild focal  basal and moderate concentric hypertrophy of the septum. There is  LVOT gradient of 41 mm Hg with valsalva (not well visualized)  Systolic function was normal. The estimated ejection fraction was  in the range of 60% to 65%. Regional wall motion abnormalities  cannot be excluded. Doppler parameters are consistent with  abnormal left ventricular relaxation (grade 1 diastolic  dysfunction).  - Aorta: Ascending aorta is mildly dilated, diameter: 37 mm (S).  - Left atrium: The atrium was normal in size.  - Right ventricle: Systolic function was normal.  - Pulmonary arteries: Systolic pressure was within the normal  range.   Impressions:   - Challenging image quality. __________  2D echo 02/11/2020: 1. Left ventricular ejection fraction, by estimation, is 55 to 60%. The  left ventricle has normal function. The left ventricle has no regional  wall motion abnormalities. Left ventricular diastolic parameters are  consistent with Grade I diastolic  dysfunction (impaired relaxation).  2. Right ventricular systolic function is normal. The right ventricular  size is not well visualized.  3. The mitral valve is grossly normal. No evidence of mitral valve   regurgitation.  4. The aortic valve was not well visualized. Aortic valve regurgitation  not assessed.  5. The inferior vena cava is normal in size with greater than 50%  respiratory variability, suggesting right atrial pressure of 3 mmHg. __________  Eugenie Birks MPI 03/01/2020:  There was no ST segment deviation noted during stress.  No T wave inversion was noted during stress.  The study is normal.  This is a low risk study.  The left ventricular ejection fraction is hyperdynamic (>65%).  CT attenuation images show evidence of aortic calcifications but minimal coronary calcifications.   EKG:  EKG is ordered today.  The EKG ordered today demonstrates ***  Recent Labs: No results found for requested labs within last 8760 hours.  Recent Lipid Panel    Component Value Date/Time   CHOL 177 03/13/2019 1206   TRIG 141 03/13/2019 1206   HDL 48 03/13/2019 1206   CHOLHDL 3.7 03/13/2019 1206   CHOLHDL 3.8 02/19/2017 1445   LDLCALC 104 (H) 03/13/2019 1206   LDLCALC 109 (H) 02/19/2017  1445    PHYSICAL EXAM:    VS:  There were no vitals taken for this visit.  BMI: There is no height or weight on file to calculate BMI.  Physical Exam  Wt Readings from Last 3 Encounters:  02/24/20 217 lb (98.4 kg)  01/28/20 217 lb 8 oz (98.7 kg)  10/20/19 (!) 217 lb 12.8 oz (98.8 kg)     ASSESSMENT & PLAN:   1. ***  Disposition: F/u with Dr. Mariah Milling or an APP in ***.   Medication Adjustments/Labs and Tests Ordered: Current medicines are reviewed at length with the patient today.  Concerns regarding medicines are outlined above. Medication changes, Labs and Tests ordered today are summarized above and listed in the Patient Instructions accessible in Encounters.   Signed, Eula Listen, PA-C 04/05/2020 11:03 AM     [facility] [place] [address] [place], [place] [address] [phone]

## 2020-04-09 ENCOUNTER — Ambulatory Visit: Payer: PPO | Admitting: Physician Assistant

## 2020-04-15 NOTE — Progress Notes (Unsigned)
Cardiology Office Note    Date:  04/22/2020   ID:  Latoya Cordova, DOB June 15, 1945, MRN [id number]  PCP:  Latoya Cordova  Cardiologist:  Latoya Cordova  Electrophysiologist:  None   Chief Complaint: Follow up  History of Present Illness:   Latoya Cordova is a 75 y.o. female with history of HFpEF, HTN, HLD, obesity, and anxiety who presents for follow up of her HFpEF.   Prior echo from 08/2014 showed an EF of 50-55% and mild mitral regurgitation. She was admitted to the hospital in 03/2015 with acute respiratory distress felt to be secondary to viral pneumonitis vs PNA. Echo at that time showed an EF of 60-65%, mild focal and moderate concentric LVH, LVOT gradient of 41 mmHg with Valsalva, Gr1DD, mildly dilated ascending aorta measuring 37 mm, normal RVSF and PASP. CTA chest was negative for PE and there were no significant coronary artery calcifications or aortic atherosclerosis noted. She was treated with Levaquin with symptom improvement. She was seen virtually in 10/2018, noting chronic SOB, "due to weight," and was taking Lasix every other day. There was high fluid intake and it was noted she would eat when bored. No changes were made at that time. She did have a mechanical fall on 01/20/2020, while squatting down to put flowers on her husband's grave and scraped her knee. No LOC and she did not hit her head.  She was seen in the office on 01/28/2020 and was doing well from a cardiac perspective.  She noted a 1 to 2-year history of progressive exertional dyspnea.  She did feel like her sedentary lifestyle was contributing to this.  Echo on 02/11/2020 showed an EF of 55 to 60%, no regional wall motion abnormalities, grade 1 diastolic dysfunction, normal RV systolic function, estimated right atrial pressure of 3 mmHg, and no significant valvular abnormalities.  She was last seen in the office on 02/24/2020 and was doing well from a cardiac perspective.  She continued to note a  stable 1 to 2-year history of progressive exertional dyspnea without chest pain.  In this setting she underwent Lexiscan MPI on 03/01/2020 which showed no significant ischemia or scar and was overall low risk.  CT attenuation corrected images showed evidence of aortic atherosclerosis with minimal coronary artery calcification.  She comes in today doing well from a cardiac perspective.  She does continue to note stable longstanding exertional dyspnea without chest pain, palpitations, dizziness, presyncope, syncope, lower extremity swelling, abdominal distention, orthopnea, PND, early satiety.  There are sedentary lifestyle remains though she does plan on beginning a walking regimen.  She is working on a [employer]health[employer] diet.  No falls.   Labs independently reviewed: 02/2019 - TC 177, TG 141, HDL 48, LDL 104, TSH normal, BUN 13, SCr 1.13, potassium 3.9, albumin 4.5, AST 42, ALT normal, HGB 15.7, PLT 211    Past Medical History:  Diagnosis Date  . Anxiety   . Diastolic dysfunction    a. echo 08/2014: EF 50-55%, mild MR; b. echo 03/2015: EF 60-65%, RWMA could not be excluded, GR1DD, ascending aorta mildly dilated at 37mm, normal right-sided pressure  . Hyperlipidemia   . Hypertension   . Morbid obesity (HCC)     Past Surgical History:  Procedure Laterality Date  . ABDOMINAL HYSTERECTOMY  08/18/2004  . APPENDECTOMY      Current Medications: Current Meds  Medication Sig  . ALPRAZolam (XANAX) 0.5 MG tablet TAKE 1 TABLET(0.5 MG) BY MOUTH AT BEDTIME AS NEEDED  FOR ANXIETY  . buPROPion (WELLBUTRIN XL) 150 MG 24 hr tablet TAKE 1 TABLET(150 MG) BY MOUTH DAILY  . butalbital-aspirin-caffeine (FIORINAL) 50-325-40 MG capsule Take 1 capsule by mouth 2 (two) times daily as needed for headache.  . furosemide (LASIX) 20 MG tablet TAKE 1 TABLET(20 MG) BY MOUTH EVERY MORNING  . hydrochlorothiazide (HYDRODIURIL) 25 MG tablet TAKE 1 TABLET(25 MG) BY MOUTH DAILY  . HYDROcodone-acetaminophen (NORCO/VICODIN) 5-325 MG  tablet Take 1-2 tablets by mouth every 6 (six) hours as needed for moderate pain.  Marland Kitchen levothyroxine (SYNTHROID) 25 MCG tablet TAKE 1 TABLET BY MOUTH EVERY DAY  . LORazepam (ATIVAN) 0.5 MG tablet TAKE 1 TABLET(0.5 MG) BY MOUTH TWICE DAILY AS NEEDED FOR ANXIETY  . PARoxetine (PAXIL) 40 MG tablet TAKE 1 TABLET(40 MG) BY MOUTH DAILY    Allergies:   Sulfa antibiotics and Lodine  [etodolac]   Social History   Socioeconomic History  . Marital status: Widowed    Spouse name: Not on file  . Number of children: 1  . Years of education: Not on file  . Highest education level: Associate degree: occupational, Scientist, product/process development, or vocational program  Occupational History  . Occupation: retired  Tobacco Use  . Smoking status: Never Smoker  . Smokeless tobacco: Never Used  Vaping Use  . Vaping Use: Never used  Substance and Sexual Activity  . Alcohol use: No  . Drug use: No  . Sexual activity: Not on file  Other Topics Concern  . Not on file  Social History Narrative  . Not on file   Social Determinants of Health   Financial Resource Strain: Not on file  Food Insecurity: Not on file  Transportation Needs: Not on file  Physical Activity: Not on file  Stress: Not on file  Social Connections: Not on file     Family History:  The patient's family history includes Breast cancer in her cousin and maternal aunt; Congestive Heart Failure in her mother; Heart attack in her paternal grandfather; Hypertension in her mother; Ulcers in her father.  ROS:   Review of Systems  Constitutional: Positive for malaise/fatigue. Negative for chills, diaphoresis, fever and weight loss.  HENT: Negative for congestion.   Eyes: Negative for discharge and redness.  Respiratory: Positive for shortness of breath. Negative for cough, sputum production and wheezing.   Cardiovascular: Negative for chest pain, palpitations, orthopnea, claudication, leg swelling and PND.  Gastrointestinal: Negative for abdominal pain,  heartburn, nausea and vomiting.  Musculoskeletal: Negative for falls and myalgias.  Skin: Negative for rash.  Neurological: Negative for dizziness, tingling, tremors, sensory change, speech change, focal weakness, loss of consciousness and weakness.  Endo/Heme/Allergies: Does not bruise/bleed easily.  Psychiatric/Behavioral: Negative for substance abuse. The patient is not nervous/anxious.   All other systems reviewed and are negative.    EKGs/Labs/Other Studies Reviewed:    Studies reviewed were summarized above. The additional studies were reviewed today:  2D echo 08/2014: - Left ventricle: The cavity size was normal. Systolic function was  normal. The estimated ejection fraction was in the range of 50%  to 55%.  - Mitral valve: There was mild regurgitation. __________  2D echo 03/2015: - Left ventricle: The cavity size was normal. There was mild focal  basal and moderate concentric hypertrophy of the septum. There is  LVOT gradient of 41 mm Hg with valsalva (not well visualized)  Systolic function was normal. The estimated ejection fraction was  in the range of 60% to 65%. Regional wall motion abnormalities  cannot  be excluded. Doppler parameters are consistent with  abnormal left ventricular relaxation (grade 1 diastolic  dysfunction).  - Aorta: Ascending aorta is mildly dilated, diameter: 37 mm (S).  - Left atrium: The atrium was normal in size.  - Right ventricle: Systolic function was normal.  - Pulmonary arteries: Systolic pressure was within the normal  range.   Impressions:   - Challenging image quality. __________  2D echo 02/11/2020: 1. Left ventricular ejection fraction, by estimation, is 55 to 60%. The  left ventricle has normal function. The left ventricle has no regional  wall motion abnormalities. Left ventricular diastolic parameters are  consistent with Grade I diastolic  dysfunction (impaired relaxation).  2. Right ventricular  systolic function is normal. The right ventricular  size is not well visualized.  3. The mitral valve is grossly normal. No evidence of mitral valve  regurgitation.  4. The aortic valve was not well visualized. Aortic valve regurgitation  not assessed.  5. The inferior vena cava is normal in size with greater than 50%  respiratory variability, suggesting right atrial pressure of 3 mmHg. __________  Eugenie Birks MPI 03/01/2020:  There was no ST segment deviation noted during stress.  No T wave inversion was noted during stress.  The study is normal.  This is a low risk study.  The left ventricular ejection fraction is hyperdynamic (>65%).  CT attenuation images show evidence of aortic calcifications but minimal coronary calcifications.   EKG:  EKG is not ordered today.    Recent Labs: No results found for requested labs within last 8760 hours.  Recent Lipid Panel    Component Value Date/Time   CHOL 177 03/13/2019 1206   TRIG 141 03/13/2019 1206   HDL 48 03/13/2019 1206   CHOLHDL 3.7 03/13/2019 1206   CHOLHDL 3.8 02/19/2017 1445   LDLCALC 104 (H) 03/13/2019 1206   LDLCALC 109 (H) 02/19/2017 1445    PHYSICAL EXAM:    VS:  BP 110/80 (BP Location: Left Arm, Patient Position: Sitting, Cuff Size: Normal)   Pulse (!) 105   Ht 5\' 3"  (1.6 m)   Wt 221 lb (100.2 kg)   SpO2 96%   BMI 39.15 kg/m   BMI: Body mass index is 39.15 kg/m.  Physical Exam Vitals reviewed.  Constitutional:      Appearance: She is well-developed and well-nourished.  HENT:     Head: Normocephalic and atraumatic.  Eyes:     General:        Right eye: No discharge.        Left eye: No discharge.  Neck:     Vascular: No JVD.  Cardiovascular:     Rate and Rhythm: Normal rate and regular rhythm.     Pulses: No midsystolic click and no opening snap.          Posterior tibial pulses are 2+ on the right side and 2+ on the left side.     Heart sounds: Normal heart sounds, S1 normal and S2 normal.  Heart sounds not distant. No murmur heard. No friction rub.  Pulmonary:     Effort: Pulmonary effort is normal. No respiratory distress.     Breath sounds: Normal breath sounds. No decreased breath sounds, wheezing or rales.  Chest:     Chest wall: No tenderness.  Abdominal:     General: There is no distension.     Palpations: Abdomen is soft.     Tenderness: There is no abdominal tenderness.  Musculoskeletal:  General: No edema.     Cervical back: Normal range of motion.  Skin:    General: Skin is warm and dry.     Nails: There is no clubbing or cyanosis.  Neurological:     Mental Status: She is alert and oriented to person, place, and time.  Psychiatric:        Mood and Affect: Mood and affect normal.        Speech: Speech normal.        Behavior: Behavior normal.        Thought Content: Thought content normal.        Judgment: Judgment normal.     Wt Readings from Last 3 Encounters:  04/22/20 221 lb (100.2 kg)  04/21/20 221 lb (100.2 kg)  02/24/20 217 lb (98.4 kg)     ASSESSMENT & PLAN:   1. Exertional dyspnea: Cardiac work-up including echo and Lexiscan MPI have been reassuring.  Suspect her longstanding dyspnea is multifactorial including morbid obesity and physical deconditioning.  Could consider PFTs.  2. HFpEF: Volume status is somewhat difficult to assess on exam secondary to body habitus though she does appear euvolemic and well compensated with stable weight.  Recent echo reassuring as outlined above.  She remains on low-dose Lasix.  Sodium restriction is advised.  3. HTN: Blood pressure is well controlled in the office today.  She remains on HCTZ.  Check CMP.  4. HLD: LDL 104 with normal ALT in 02/2019.  With noted minimal coronary artery calcification and aortic atherosclerosis on recent stress testing images, her goal LDL is now less than 70.  Check lipid panel, CMP, and direct LDL.  If update LDL remains above goal we will plan to initiate rosuvastatin  10 mg daily with follow-up fasting lipid panel and LFT to be obtained in approximately 8 weeks.  5. Morbid obesity/physical deconditioning: Likely the main contributing factor to her underlying dyspnea.  Recommend she follow-up with prior physical therapy referral.  Weight loss advised.  Disposition: F/u with Dr. Mariah Milling or an APP in 6 months.   Medication Adjustments/Labs and Tests Ordered: Current medicines are reviewed at length with the patient today.  Concerns regarding medicines are outlined above. Medication changes, Labs and Tests ordered today are summarized above and listed in the Patient Instructions accessible in Encounters.   Signed, Eula Listen, PA-C 04/22/2020 2:23 PM     [facility] [address] [place], [place] [address] [phone]

## 2020-04-21 ENCOUNTER — Encounter: Payer: Self-pay | Admitting: Family Medicine

## 2020-04-21 ENCOUNTER — Ambulatory Visit (INDEPENDENT_AMBULATORY_CARE_PROVIDER_SITE_OTHER): Payer: PPO | Admitting: Family Medicine

## 2020-04-21 ENCOUNTER — Other Ambulatory Visit: Payer: Self-pay

## 2020-04-21 VITALS — BP 132/84 | HR 99 | Temp 98.4°F | Wt 221.0 lb

## 2020-04-21 DIAGNOSIS — Z23 Encounter for immunization: Secondary | ICD-10-CM

## 2020-04-21 DIAGNOSIS — I5189 Other ill-defined heart diseases: Secondary | ICD-10-CM | POA: Diagnosis not present

## 2020-04-21 DIAGNOSIS — Z6838 Body mass index (BMI) 38.0-38.9, adult: Secondary | ICD-10-CM | POA: Diagnosis not present

## 2020-04-21 DIAGNOSIS — R519 Headache, unspecified: Secondary | ICD-10-CM | POA: Diagnosis not present

## 2020-04-21 DIAGNOSIS — I10 Essential (primary) hypertension: Secondary | ICD-10-CM

## 2020-04-21 DIAGNOSIS — G8929 Other chronic pain: Secondary | ICD-10-CM

## 2020-04-21 DIAGNOSIS — E039 Hypothyroidism, unspecified: Secondary | ICD-10-CM

## 2020-04-21 DIAGNOSIS — E782 Mixed hyperlipidemia: Secondary | ICD-10-CM

## 2020-04-21 DIAGNOSIS — M1990 Unspecified osteoarthritis, unspecified site: Secondary | ICD-10-CM

## 2020-04-21 DIAGNOSIS — I5032 Chronic diastolic (congestive) heart failure: Secondary | ICD-10-CM | POA: Diagnosis not present

## 2020-04-21 DIAGNOSIS — M797 Fibromyalgia: Secondary | ICD-10-CM

## 2020-04-21 MED ORDER — BUTALBITAL-ASPIRIN-CAFFEINE 50-325-40 MG PO CAPS: 1.0000 | ORAL_CAPSULE | Freq: Two times a day (BID) | ORAL | 2 refills | Status: DC | PRN

## 2020-04-21 MED ORDER — HYDROCODONE-ACETAMINOPHEN 5-325 MG PO TABS: 1.0000 | ORAL_TABLET | Freq: Four times a day (QID) | ORAL | 0 refills | Status: DC | PRN

## 2020-04-21 NOTE — Progress Notes (Signed)
Established patient visit   Patient: Latoya Cordova   DOB: Oct 21, 1945   75 y.o. Female  MRN: [id number] Visit Date: 04/21/2020  Today's healthcare provider: Megan Mans, MD   Chief Complaint  Patient presents with  . Hypertension  . Depression   Subjective    HPI  Patient comes in today for follow-up.  She states she feels "good". Emotionally she is also feeling well. Hypertension, follow-up  BP Readings from Last 3 Encounters:  04/21/20 132/84  02/24/20 120/76  01/28/20 (!) 138/100   Wt Readings from Last 3 Encounters:  04/21/20 221 lb (100.2 kg)  02/24/20 217 lb (98.4 kg)  01/28/20 217 lb 8 oz (98.7 kg)     She was last seen for hypertension 6 months ago.  BP at that visit was 138/79. Management since that visit includes; on HCTZ. She reports excellent compliance with treatment. She is not having side effects.  She is not exercising. She is adherent to low salt diet.   Outside blood pressures are .  She does not smoke.  Use of agents associated with hypertension: none.   --------------------------------------------------------------------------------------------------- Depression, Follow-up  She  was last seen for this 6 months ago. Changes made at last visit include; Consider stopping Paxil in the future.   She reports excellent compliance with treatment. She is not having side effects.   She reports excellent tolerance of treatment.    Depression screen [facility] 2/9 04/21/2020 10/20/2019 10/03/2018  Decreased Interest 1 1 0  Down, Depressed, Hopeless 1 0 1  PHQ - 2 Score 2 1 1   Altered sleeping 1 0 -  Tired, decreased energy 1 1 -  Change in appetite 1 1 -  Feeling bad or failure about yourself  0 0 -  Trouble concentrating 0 0 -  Moving slowly or fidgety/restless 0 0 -  Suicidal thoughts 0 0 -  PHQ-9 Score 5 3 -  Difficult doing work/chores Not difficult at all - -     ----------------------------------------------------------------------------------------- Anxiety, Follow-up  She was last seen for anxiety 6 months ago. Changes made at last visit include; And to cut back on either lorazepam or Ativan.   She reports excellent compliance with treatment. She reports excellent tolerance of treatment. She is not having side effects.   Symptoms: No chest pain No difficulty concentrating  No dizziness No fatigue  No feelings of losing control No insomnia  No irritable No palpitations  No panic attacks No racing thoughts  No shortness of breath No sweating  No tremors/shakes    GAD-7 Results No flowsheet data found.  PHQ-9 Scores PHQ9 SCORE ONLY 04/21/2020 10/20/2019 10/03/2018  PHQ-9 Total Score 5 3 1     ---------------------------------------------------------------------------------------------------  Chronic nonintractable headache, unspecified headache type From 10/20/2019-Use Fiorinal infrequently as recommended - butalbital-aspirin-caffeine (FIORINAL) 50-325-40 MG capsule; Take 1 capsule by mouth 2 (two) times daily as needed for headache.  Dispense: 50 capsule; Refill: 2  Osteoarthritis, unspecified osteoarthritis type, unspecified site From 10/20/2019-given rx for HYDROcodone-acetaminophen 5-325 mg.   Patient Active Problem List   Diagnosis Date Noted  . Chronic diastolic CHF (congestive heart failure) (HCC) 05/30/2016  . Sinus tachycardia 06/17/2015  . Diastolic dysfunction   . Hypertension   . Chest pain 04/08/2015  . Pneumonia 04/08/2015  . SOB (shortness of breath)   . Hypoxia   . Morbid obesity due to excess calories (HCC)   . Fibromyalgia   . Allergic rhinitis 01/12/2015  . Clinical depression 01/12/2015  .  Anxiety, generalized 01/12/2015  . Fibrositis 01/12/2015  . Acid reflux 01/12/2015  . Need for prophylactic hormone replacement therapy (postmenopausal) 01/12/2015  . HLD (hyperlipidemia) 01/12/2015  . Adult  hypothyroidism 01/12/2015  . Arthritis, degenerative 01/12/2015  . Cardiac murmur 01/12/2015  . Osteopenia 01/12/2015  . Adiposity 01/12/2015   Past Medical History:  Diagnosis Date  . Anxiety   . Diastolic dysfunction    a. echo 08/2014: EF 50-55%, mild MR; b. echo 03/2015: EF 60-65%, RWMA could not be excluded, GR1DD, ascending aorta mildly dilated at 72mm, normal right-sided pressure  . Hyperlipidemia   . Hypertension   . Morbid obesity (HCC)    Social History   Tobacco Use  . Smoking status: Never Smoker  . Smokeless tobacco: Never Used  Vaping Use  . Vaping Use: Never used  Substance Use Topics  . Alcohol use: No  . Drug use: No   Allergies  Allergen Reactions  . Sulfa Antibiotics     Per Mother  . Lodine  [Etodolac] Rash    Abdominal rash.     Medications: Outpatient Medications Prior to Visit  Medication Sig  . ALPRAZolam (XANAX) 0.5 MG tablet TAKE 1 TABLET(0.5 MG) BY MOUTH AT BEDTIME AS NEEDED FOR ANXIETY  . buPROPion (WELLBUTRIN XL) 150 MG 24 hr tablet TAKE 1 TABLET(150 MG) BY MOUTH DAILY  . butalbital-aspirin-caffeine (FIORINAL) 50-325-40 MG capsule Take 1 capsule by mouth 2 (two) times daily as needed for headache.  . furosemide (LASIX) 20 MG tablet TAKE 1 TABLET(20 MG) BY MOUTH EVERY MORNING  . hydrochlorothiazide (HYDRODIURIL) 25 MG tablet TAKE 1 TABLET(25 MG) BY MOUTH DAILY  . HYDROcodone-acetaminophen (NORCO/VICODIN) 5-325 MG tablet Take 1-2 tablets by mouth every 6 (six) hours as needed for moderate pain.  Marland Kitchen levothyroxine (SYNTHROID) 25 MCG tablet TAKE 1 TABLET BY MOUTH EVERY DAY  . LORazepam (ATIVAN) 0.5 MG tablet TAKE 1 TABLET(0.5 MG) BY MOUTH TWICE DAILY AS NEEDED FOR ANXIETY  . PARoxetine (PAXIL) 40 MG tablet TAKE 1 TABLET(40 MG) BY MOUTH DAILY   No facility-administered medications prior to visit.    Review of Systems  Constitutional: Negative.  Negative for appetite change, chills, fatigue and fever.  Respiratory: Negative.  Negative for chest  tightness and shortness of breath.   Cardiovascular: Negative.  Negative for chest pain and palpitations.  Gastrointestinal: Negative.  Negative for abdominal pain, nausea and vomiting.  Neurological: Negative for dizziness, weakness, light-headedness and headaches.       Objective    BP 132/84 (BP Location: Left Arm, Patient Position: Sitting, Cuff Size: Large)   Pulse 99   Temp 98.4 F (36.9 C) (Oral)   Wt 221 lb (100.2 kg)   BMI 40.42 kg/m     Physical Exam Vitals reviewed.  Constitutional:      Appearance: She is well-developed. She is obese.  HENT:     Head: Normocephalic and atraumatic.     Right Ear: Tympanic membrane and external ear normal.     Left Ear: Tympanic membrane and external ear normal.     Nose: Nose normal.     Mouth/Throat:     Pharynx: Oropharynx is clear.  Eyes:     General: No scleral icterus.    Conjunctiva/sclera: Conjunctivae normal.     Pupils: Pupils are equal, round, and reactive to light.  Neck:     Thyroid: No thyromegaly.  Cardiovascular:     Rate and Rhythm: Normal rate and regular rhythm.     Heart sounds: Normal heart sounds.  Pulmonary:     Effort: Pulmonary effort is normal.     Breath sounds: Normal breath sounds.  Abdominal:     General: Bowel sounds are normal.     Palpations: Abdomen is soft.  Musculoskeletal:     Cervical back: Normal range of motion and neck supple.  Skin:    General: Skin is warm and dry.  Neurological:     General: No focal deficit present.     Mental Status: She is alert and oriented to person, place, and time.     Deep Tendon Reflexes: Reflexes are normal and symmetric.  Psychiatric:        Mood and Affect: Mood normal.        Behavior: Behavior normal.        Thought Content: Thought content normal.        Judgment: Judgment normal.       No results found for any visits on 04/21/20.  Assessment & Plan     1. Primary hypertension Good control.  2. Adult hypothyroidism Follow  TSH.  3. Osteoarthritis, unspecified osteoarthritis type, unspecified site And to cut down on doses of hydrocodone in the future. - HYDROcodone-acetaminophen (NORCO/VICODIN) 5-325 MG tablet; Take 1-2 tablets by mouth every 6 (six) hours as needed for moderate pain.  Dispense: 50 tablet; Refill: 0  4. Fibrositis   5. Class 2 severe obesity due to excess calories with serious comorbidity and body mass index (BMI) of 38.0 to 38.9 in adult [facility]) Diet and exercise stressed.  6. Diastolic dysfunction   7. Mixed hyperlipidemia   8. Chronic nonintractable headache, unspecified headache type Ashby Dawes not to use more than 2/week. - butalbital-aspirin-caffeine (FIORINAL) 50-325-40 MG capsule; Take 1 capsule by mouth 2 (two) times daily as needed for headache.  Dispense: 50 capsule; Refill: 2  9. Chronic diastolic CHF (congestive heart failure) (HCC)   10. Need for influenza vaccination  - Flu Vaccine QUAD High Dose(Fluad)   No follow-ups on file.      I, Megan Mans, MD, have reviewed all documentation for this visit. The documentation on 04/25/20 for the exam, diagnosis, procedures, and orders are all accurate and complete.    Richard Wendelyn Breslow, MD  [facility] [phone] (phone) [phone] (fax)  [facility]

## 2020-04-22 ENCOUNTER — Other Ambulatory Visit: Payer: Self-pay

## 2020-04-22 ENCOUNTER — Ambulatory Visit: Payer: PPO | Admitting: Physician Assistant

## 2020-04-22 ENCOUNTER — Encounter: Payer: Self-pay | Admitting: Physician Assistant

## 2020-04-22 VITALS — BP 110/80 | HR 105 | Ht 63.0 in | Wt 221.0 lb

## 2020-04-22 DIAGNOSIS — R0609 Other forms of dyspnea: Secondary | ICD-10-CM

## 2020-04-22 DIAGNOSIS — E785 Hyperlipidemia, unspecified: Secondary | ICD-10-CM | POA: Diagnosis not present

## 2020-04-22 DIAGNOSIS — R5381 Other malaise: Secondary | ICD-10-CM | POA: Diagnosis not present

## 2020-04-22 DIAGNOSIS — I10 Essential (primary) hypertension: Secondary | ICD-10-CM | POA: Diagnosis not present

## 2020-04-22 DIAGNOSIS — R0600 Dyspnea, unspecified: Secondary | ICD-10-CM | POA: Diagnosis not present

## 2020-04-22 DIAGNOSIS — I5032 Chronic diastolic (congestive) heart failure: Secondary | ICD-10-CM

## 2020-04-22 NOTE — Patient Instructions (Signed)
Medication Instructions:  No changes  *If you need a refill on your cardiac medications before your next appointment, please call your pharmacy*   Lab Work: CMET, Lipid, Direct LDL If you have labs (blood work) drawn today and your tests are completely normal, you will receive your results only by: Marland Kitchen MyChart Message (if you have MyChart) OR . A paper copy in the mail If you have any lab test that is abnormal or we need to change your treatment, we will call you to review the results.   Testing/Procedures: None   Follow-Up: At [facility], you and your health needs are our priority.  As part of our continuing mission to provide you with exceptional heart care, we have created designated Provider Care Teams.  These Care Teams include your primary Cardiologist (physician) and Advanced Practice Providers (APPs -  Physician Assistants and Nurse Practitioners) who all work together to provide you with the care you need, when you need it.  We recommend signing up for the patient portal called "MyChart".  Sign up information is provided on this After Visit Summary.  MyChart is used to connect with patients for Virtual Visits (Telemedicine).  Patients are able to view lab/test results, encounter notes, upcoming appointments, etc.  Non-urgent messages can be sent to your provider as well.   To learn more about what you can do with MyChart, go to [email or web address].    Your next appointment:   6 month(s)  The format for your next appointment:   In Person  Provider:   You may see Julien Nordmann, MD or one of the following Advanced Practice Providers on your designated Care Team:    Nicolasa Ducking, NP  Eula Listen, PA-C  Marisue Ivan, PA-C  Cadence Jenkins, [place]  Gillian Shields, NP

## 2020-04-23 ENCOUNTER — Telehealth: Payer: Self-pay | Admitting: *Deleted

## 2020-04-23 DIAGNOSIS — E785 Hyperlipidemia, unspecified: Secondary | ICD-10-CM

## 2020-04-23 LAB — COMPREHENSIVE METABOLIC PANEL
ALT: 31 IU/L (ref 0–32)
AST: 35 IU/L (ref 0–40)
Albumin/Globulin Ratio: 1.3 (ref 1.2–2.2)
Albumin: 4.3 g/dL (ref 3.7–4.7)
Alkaline Phosphatase: 85 IU/L (ref 44–121)
BUN/Creatinine Ratio: 20 (ref 12–28)
BUN: 18 mg/dL (ref 8–27)
Bilirubin Total: 0.3 mg/dL (ref 0.0–1.2)
CO2: 27 mmol/L (ref 20–29)
Calcium: 9.4 mg/dL (ref 8.7–10.3)
Chloride: 98 mmol/L (ref 96–106)
Creatinine, Ser: 0.9 mg/dL (ref 0.57–1.00)
GFR calc Af Amer: 72 mL/min/{1.73_m2} (ref >59)
GFR calc non Af Amer: 63 mL/min/{1.73_m2} (ref >59)
Globulin, Total: 3.2 g/dL (ref 1.5–4.5)
Glucose: 109 mg/dL — ABNORMAL HIGH (ref 65–99)
Potassium: 4.1 mmol/L (ref 3.5–5.2)
Sodium: 141 mmol/L (ref 134–144)
Total Protein: 7.5 g/dL (ref 6.0–8.5)

## 2020-04-23 LAB — LIPID PANEL
Chol/HDL Ratio: 4.1 ratio (ref 0.0–4.4)
Cholesterol, Total: 181 mg/dL (ref 100–199)
HDL: 44 mg/dL (ref >39)
LDL Chol Calc (NIH): 107 mg/dL — ABNORMAL HIGH (ref 0–99)
Triglycerides: 173 mg/dL — ABNORMAL HIGH (ref 0–149)
VLDL Cholesterol Cal: 30 mg/dL (ref 5–40)

## 2020-04-23 LAB — LDL CHOLESTEROL, DIRECT: LDL Direct: 104 mg/dL — ABNORMAL HIGH (ref 0–99)

## 2020-04-23 NOTE — Telephone Encounter (Signed)
No answer/Voicemail box is full.  

## 2020-04-23 NOTE — Telephone Encounter (Signed)
-----   Message from Sondra Barges, PA-C sent at 04/23/2020  7:27 AM EST ----- Random glucose okay Renal and liver function normal  Potassium at goal Triglycerides mildly elevated though this was a nonfasting sample LDL remains above goal with a current value of 104  Recommendations: -Start Crestor 10 mg daily -Recheck fasting lipid panel and liver function in approximately 8 weeks

## 2020-04-26 ENCOUNTER — Encounter: Payer: Self-pay | Admitting: *Deleted

## 2020-04-26 MED ORDER — ROSUVASTATIN CALCIUM 10 MG PO TABS: 10.0000 mg | ORAL_TABLET | Freq: Every day | ORAL | 3 refills | Status: DC

## 2020-04-26 NOTE — Telephone Encounter (Signed)
Reviewed results and recommendations with patient. She requested labs and lab slips to be sent to her with instructions. Discussed starting Rosuvastatin (Crestor) 10 mg once daily and repeat labs to be done in 8 weeks. Instructed her to have those done at the [facility] entrance around the beginning of April. Reviewed that we would like her to fast for these labs to be done. Instructed her not to eat or drink anything after midnight before except sip of water with her medications. Called pharmacy and reviewed that she is not able to print coupon to get the lower price of $14.60 for 90 day supply. Prescription sent in electronically and they are aware she will come in to pick up tomorrow. All questions were answered for patient and pharmacy. No further concerns at this time.

## 2020-05-31 ENCOUNTER — Other Ambulatory Visit: Payer: Self-pay | Admitting: Family Medicine

## 2020-05-31 ENCOUNTER — Other Ambulatory Visit: Payer: Self-pay | Admitting: Cardiovascular Disease

## 2020-05-31 DIAGNOSIS — F3289 Other specified depressive episodes: Secondary | ICD-10-CM

## 2020-05-31 DIAGNOSIS — I10 Essential (primary) hypertension: Secondary | ICD-10-CM

## 2020-05-31 DIAGNOSIS — E038 Other specified hypothyroidism: Secondary | ICD-10-CM

## 2020-07-26 ENCOUNTER — Other Ambulatory Visit: Payer: Self-pay | Admitting: Family Medicine

## 2020-07-26 NOTE — Telephone Encounter (Signed)
Requested medication (s) are due for refill today Yes  Requested medication (s) are on the active medication list Yes  Future visit scheduled Yes for 09/02/20.  Note to clinic-Alprazolam is not delegated for [facility] to refill.   Requested Prescriptions  Pending Prescriptions Disp Refills   ALPRAZolam (XANAX) 0.5 MG tablet [Pharmacy Med Name: ALPRAZOLAM 0.5MG  TABLETS] 30 tablet     Sig: TAKE 1 TABLET(0.5 MG) BY MOUTH AT BEDTIME AS NEEDED FOR ANXIETY      Not Delegated - Psychiatry:  Anxiolytics/Hypnotics Failed - 07/26/2020  4:26 PM      Failed - This refill cannot be delegated      Failed - Urine Drug Screen completed in last 360 days      Passed - Valid encounter within last 6 months    Recent Outpatient Visits           3 months ago Primary hypertension   [facility] Maple Hudson., MD   9 months ago Annual physical exam   [facility] Maple Hudson., MD   1 year ago Essential (primary) hypertension   [facility] Maple Hudson., MD   1 year ago Class 2 severe obesity due to excess calories with serious comorbidity and body mass index (BMI) of 38.0 to 38.9 in adult [facility])   [facility] Maple Hudson., MD   2 years ago Need for influenza vaccination   [facility] Maple Hudson[facility] MD       Future Appointments             In 1 month Maple Hudson., MD [facility], [facility]

## 2020-08-19 ENCOUNTER — Other Ambulatory Visit: Payer: Self-pay | Admitting: Family Medicine

## 2020-08-19 NOTE — Telephone Encounter (Signed)
   Notes to clinic This was ordered on 5/4 with two refills, it is not delegated so I could not approve it but she has not filled the presciption yet that I can see, the last refill 04/09/20 was on the old rx. Not Delegated.

## 2020-08-20 NOTE — Telephone Encounter (Signed)
Already filled with refills on 5/4

## 2020-09-02 ENCOUNTER — Other Ambulatory Visit: Payer: Self-pay

## 2020-09-02 ENCOUNTER — Ambulatory Visit (INDEPENDENT_AMBULATORY_CARE_PROVIDER_SITE_OTHER): Payer: PPO | Admitting: Family Medicine

## 2020-09-02 ENCOUNTER — Encounter: Payer: Self-pay | Admitting: Family Medicine

## 2020-09-02 VITALS — BP 117/77 | HR 96 | Temp 97.9°F | Resp 24 | Ht 63.0 in | Wt 229.0 lb

## 2020-09-02 DIAGNOSIS — I5032 Chronic diastolic (congestive) heart failure: Secondary | ICD-10-CM | POA: Diagnosis not present

## 2020-09-02 DIAGNOSIS — E039 Hypothyroidism, unspecified: Secondary | ICD-10-CM | POA: Diagnosis not present

## 2020-09-02 DIAGNOSIS — Z6838 Body mass index (BMI) 38.0-38.9, adult: Secondary | ICD-10-CM | POA: Diagnosis not present

## 2020-09-02 DIAGNOSIS — F3289 Other specified depressive episodes: Secondary | ICD-10-CM

## 2020-09-02 DIAGNOSIS — M1990 Unspecified osteoarthritis, unspecified site: Secondary | ICD-10-CM

## 2020-09-02 DIAGNOSIS — R519 Headache, unspecified: Secondary | ICD-10-CM

## 2020-09-02 DIAGNOSIS — G8929 Other chronic pain: Secondary | ICD-10-CM | POA: Diagnosis not present

## 2020-09-02 DIAGNOSIS — F411 Generalized anxiety disorder: Secondary | ICD-10-CM

## 2020-09-02 DIAGNOSIS — E782 Mixed hyperlipidemia: Secondary | ICD-10-CM

## 2020-09-02 DIAGNOSIS — I10 Essential (primary) hypertension: Secondary | ICD-10-CM | POA: Diagnosis not present

## 2020-09-02 NOTE — Progress Notes (Signed)
Established patient visit   Patient: Latoya Cordova   DOB: 03-Jul-1945   75 y.o. Female  MRN: [id number] Visit Date: 09/02/2020  Today's healthcare provider: Megan Mans, MD   Chief Complaint  Patient presents with   Hypertension   Anxiety   Subjective    HPI  Overall patient is doing well and stable.  He is making intentional attempt to cut back on all her for controlled substances which she knows I am against her taking.  Mild she was recently prescribed alprazolam instead of lorazepam..  Her reflux is stable and she is having no new issues. She has had 3 COVID vaccines. Hypertension, follow-up  BP Readings from Last 3 Encounters:  09/02/20 117/77  04/22/20 110/80  04/21/20 132/84   Wt Readings from Last 3 Encounters:  09/02/20 229 lb (103.9 kg)  04/22/20 221 lb (100.2 kg)  04/21/20 221 lb (100.2 kg)     She was last seen for hypertension 6 months ago.  BP at that visit was 110/80. Management since that visit includes no medication changes.  She reports good compliance with treatment. She is not having side effects.  She is following a Regular diet. She is not exercising. She does not smoke.  Use of agents associated with hypertension: none.   Outside blood pressures are checked occasionally. Symptoms: No chest pain No chest pressure  No palpitations No syncope  No dyspnea No orthopnea  No paroxysmal nocturnal dyspnea No lower extremity edema   Pertinent labs: Lab Results  Component Value Date   CHOL 181 04/22/2020   HDL 44 04/22/2020   LDLCALC 107 (H) 04/22/2020   LDLDIRECT 104 (H) 04/22/2020   TRIG 173 (H) 04/22/2020   CHOLHDL 4.1 04/22/2020   Lab Results  Component Value Date   NA 141 04/22/2020   K 4.1 04/22/2020   CREATININE 0.90 04/22/2020   GFRNONAA 63 04/22/2020   GFRAA 72 04/22/2020   GLUCOSE 109 (H) 04/22/2020     The 10-year ASCVD risk score Denman George DC Jr., et al., 2013) is: 19.5%    --------------------------------------------------------------------------------------------------- Anxiety, Follow-up  She was last seen for anxiety 6 months ago. Changes made at last visit include no medication changes.   She reports good compliance with treatment. She reports good tolerance of treatment. She is not having side effects.   She feels her anxiety is mild and Unchanged since last visit.  Symptoms: No chest pain No difficulty concentrating  No dizziness No fatigue  No feelings of losing control No insomnia  No irritable No palpitations  No panic attacks No racing thoughts  No shortness of breath No sweating  No tremors/shakes    GAD-7 Results GAD-7 Generalized Anxiety Disorder Screening Tool 09/02/2020  1. Feeling Nervous, Anxious, or on Edge 1  2. Not Being Able to Stop or Control Worrying 1  3. Worrying Too Much About Different Things 1  4. Trouble Relaxing 0  5. Being So Restless it's Hard To Sit Still 0  6. Becoming Easily Annoyed or Irritable 0  7. Feeling Afraid As If Something Awful Might Happen 0  Total GAD-7 Score 3  Difficulty At Work, Home, or Getting  Along With Others? Not difficult at all    PHQ-9 Scores PHQ9 SCORE ONLY 09/02/2020 04/21/2020 10/20/2019  PHQ-9 Total Score 6 5 3    Chronic nonintractable headache, unspecified headache type From 04/21/2020- Not to use Fiorinal more than 2/week.       Medications: Outpatient Medications Prior to  Visit  Medication Sig   ALPRAZolam (XANAX) 0.5 MG tablet TAKE 1 TABLET(0.5 MG) BY MOUTH AT BEDTIME AS NEEDED FOR ANXIETY   buPROPion (WELLBUTRIN XL) 150 MG 24 hr tablet TAKE 1 TABLET(150 MG) BY MOUTH DAILY   butalbital-aspirin-caffeine (FIORINAL) 50-325-40 MG capsule Take 1 capsule by mouth 2 (two) times daily as needed for headache.   furosemide (LASIX) 20 MG tablet TAKE 1 TABLET(20 MG) BY MOUTH EVERY MORNING   hydrochlorothiazide (HYDRODIURIL) 25 MG tablet TAKE 1 TABLET(25 MG) BY MOUTH DAILY    HYDROcodone-acetaminophen (NORCO/VICODIN) 5-325 MG tablet Take 1-2 tablets by mouth every 6 (six) hours as needed for moderate pain.   levothyroxine (SYNTHROID) 25 MCG tablet TAKE 1 TABLET BY MOUTH EVERY DAY   LORazepam (ATIVAN) 0.5 MG tablet TAKE 1 TABLET(0.5 MG) BY MOUTH TWICE DAILY AS NEEDED FOR ANXIETY   PARoxetine (PAXIL) 40 MG tablet TAKE 1 TABLET(40 MG) BY MOUTH DAILY   rosuvastatin (CRESTOR) 10 MG tablet Take 1 tablet (10 mg total) by mouth daily.   No facility-administered medications prior to visit.    Review of Systems  Constitutional:  Positive for fatigue. Negative for activity change.  Respiratory:  Positive for shortness of breath. Negative for cough.   Cardiovascular:  Negative for chest pain, palpitations and leg swelling.  Neurological:  Negative for dizziness, light-headedness and headaches.  Psychiatric/Behavioral:  Negative for agitation, self-injury, sleep disturbance and suicidal ideas. The patient is not nervous/anxious.        Objective    BP 117/77   Pulse 96   Temp 97.9 F (36.6 C)   Resp (!) 24   Ht 5\' 3"  (1.6 m)   Wt 229 lb (103.9 kg)   BMI 40.57 kg/m  BP Readings from Last 3 Encounters:  09/02/20 117/77  04/22/20 110/80  04/21/20 132/84   Wt Readings from Last 3 Encounters:  09/02/20 229 lb (103.9 kg)  04/22/20 221 lb (100.2 kg)  04/21/20 221 lb (100.2 kg)       Physical Exam Vitals reviewed.  Constitutional:      Appearance: She is well-developed. She is obese.  HENT:     Head: Normocephalic and atraumatic.     Right Ear: Tympanic membrane and external ear normal.     Left Ear: Tympanic membrane and external ear normal.     Nose: Nose normal.     Mouth/Throat:     Pharynx: Oropharynx is clear.  Eyes:     General: No scleral icterus.    Conjunctiva/sclera: Conjunctivae normal.     Pupils: Pupils are equal, round, and reactive to light.  Neck:     Thyroid: No thyromegaly.  Cardiovascular:     Rate and Rhythm: Normal rate and  regular rhythm.     Heart sounds: Normal heart sounds.  Pulmonary:     Effort: Pulmonary effort is normal.     Breath sounds: Normal breath sounds.  Abdominal:     General: Bowel sounds are normal.     Palpations: Abdomen is soft.  Musculoskeletal:     Cervical back: Normal range of motion and neck supple.     Right lower leg: No edema.     Left lower leg: No edema.  Skin:    General: Skin is warm and dry.  Neurological:     General: No focal deficit present.     Mental Status: She is alert and oriented to person, place, and time.     Deep Tendon Reflexes: Reflexes are normal and symmetric.  Psychiatric:        Mood and Affect: Mood normal.        Behavior: Behavior normal.        Thought Content: Thought content normal.        Judgment: Judgment normal.      No results found for any visits on 09/02/20.  Assessment & Plan     1. Primary hypertension Blood pressure well controlled on HCTZ - CBC with Differential/Platelet - Comprehensive metabolic panel  2. Adult hypothyroidism Follow-up TSH - TSH  3. Osteoarthritis, unspecified osteoarthritis type, unspecified site Continue to work on weight loss and staying active  4. Mixed hyperlipidemia On rosuvastatin 10 - Lipid panel  5. Class 2 severe obesity due to excess calories with serious comorbidity and body mass index (BMI) of 38.0 to 38.9 in adult [facility]) Diet and exercise stressed.  6. Other depression On Wellbutrin and paroxetine  7. Anxiety, generalized Consider cutting back on paroxetine as it may help her to lose weight a little bit of the lower dose.  8. Chronic diastolic CHF (congestive heart failure) (HCC) On Lasix as needed.  9. Chronic nonintractable headache, unspecified headache type Patient on butalbital and as needed Norco.  Have strongly advised her not to overuse any of these.  Maybe once or twice a month for each of these.  She understands.  Follow-up for physical this fall   No follow-ups on  file.     I, Megan Mans, MD, have reviewed all documentation for this visit. The documentation on 09/06/20 for the exam, diagnosis, procedures, and orders are all accurate and complete.     Josanna Hefel Wendelyn Breslow, MD  [facility] [phone] (phone) [phone] (fax)  [facility]

## 2020-09-05 ENCOUNTER — Other Ambulatory Visit: Payer: Self-pay | Admitting: Family Medicine

## 2020-09-05 DIAGNOSIS — F3289 Other specified depressive episodes: Secondary | ICD-10-CM

## 2020-09-05 NOTE — Telephone Encounter (Signed)
Requested Prescriptions  Pending Prescriptions Disp Refills  . buPROPion (WELLBUTRIN XL) 150 MG 24 hr tablet [Pharmacy Med Name: BUPROPION XL 150MG  TABLETS (24 H)] 90 tablet 0    Sig: TAKE 1 TABLET(150 MG) BY MOUTH DAILY     Psychiatry: Antidepressants - bupropion Passed - 09/05/2020  9:58 AM      Passed - Completed PHQ-2 or PHQ-9 in the last 360 days      Passed - Last BP in normal range    BP Readings from Last 1 Encounters:  09/02/20 117/77         Passed - Valid encounter within last 6 months    Recent Outpatient Visits          3 days ago Primary hypertension   [facility]   4 months ago Primary hypertension   [facility]   10 months ago Annual physical exam   [facility]   1 year ago Essential (primary) hypertension   [facility]   1 year ago Class 2 severe obesity due to excess calories with serious comorbidity and body mass index (BMI) of 38.0 to 38.9 in adult [facility])   [facility] MD

## 2020-09-08 ENCOUNTER — Other Ambulatory Visit: Payer: Self-pay

## 2020-09-08 DIAGNOSIS — F411 Generalized anxiety disorder: Secondary | ICD-10-CM

## 2020-09-08 DIAGNOSIS — M1990 Unspecified osteoarthritis, unspecified site: Secondary | ICD-10-CM

## 2020-09-08 MED ORDER — LORAZEPAM 0.5 MG PO TABS
ORAL_TABLET | ORAL | 2 refills | Status: DC
Start: 2020-09-08 — End: 2020-11-30

## 2020-09-08 MED ORDER — HYDROCODONE-ACETAMINOPHEN 5-325 MG PO TABS: 1.0000 | ORAL_TABLET | Freq: Four times a day (QID) | ORAL | 0 refills | Status: DC | PRN

## 2020-09-08 NOTE — Telephone Encounter (Signed)
Patient is in the waiting room.... She said all her meds were to supposed to have been refilled to [facility] on the day she was here (09/02/20) instead of [facility] but they were all sent to [facility].  She has had these transferred since her OV to [facility].   The Lorazepam and Hydrocodone- Acet 5-325 mg. cannot be transferred because its a controlled drug.  She does not want the Xanax any more.  This shouldve been d/c from her med list.   She is waiting in the waiting room now for a written RX for Lorazepam 0.5 mg. And Hydrocodone-Acet 5-325 mg.

## 2020-09-08 NOTE — Addendum Note (Signed)
Addended by: Bosie Clos on: 09/08/2020 09:16 AM   Modules accepted: Orders

## 2020-09-08 NOTE — Telephone Encounter (Signed)
Please print out Rx. Thanks!

## 2020-11-29 ENCOUNTER — Other Ambulatory Visit: Payer: Self-pay | Admitting: Family Medicine

## 2020-11-29 DIAGNOSIS — F3289 Other specified depressive episodes: Secondary | ICD-10-CM

## 2020-11-29 DIAGNOSIS — F411 Generalized anxiety disorder: Secondary | ICD-10-CM

## 2020-11-29 DIAGNOSIS — E038 Other specified hypothyroidism: Secondary | ICD-10-CM

## 2020-12-10 ENCOUNTER — Other Ambulatory Visit: Payer: Self-pay | Admitting: Cardiovascular Disease

## 2020-12-10 DIAGNOSIS — I10 Essential (primary) hypertension: Secondary | ICD-10-CM

## 2020-12-10 NOTE — Telephone Encounter (Signed)
Please schedule overdue office visit. Thank you! 

## 2020-12-13 NOTE — Telephone Encounter (Signed)
No ans no vm   °

## 2021-02-27 ENCOUNTER — Other Ambulatory Visit: Payer: Self-pay | Admitting: Family Medicine

## 2021-02-27 DIAGNOSIS — F411 Generalized anxiety disorder: Secondary | ICD-10-CM

## 2021-02-27 DIAGNOSIS — F3289 Other specified depressive episodes: Secondary | ICD-10-CM

## 2021-02-27 DIAGNOSIS — E038 Other specified hypothyroidism: Secondary | ICD-10-CM

## 2021-02-27 NOTE — Telephone Encounter (Signed)
Requested Prescriptions  Pending Prescriptions Disp Refills  . PARoxetine (PAXIL) 40 MG tablet [Pharmacy Med Name: PAROXETINE 40 MG TAB[!]] 90 tablet 0    Sig: TAKE ONE TABLET BY MOUTH ONE TIME DAILY     Psychiatry:  Antidepressants - SSRI Passed - 02/27/2021  1:58 AM      Passed - Completed PHQ-2 or PHQ-9 in the last 360 days      Passed - Valid encounter within last 6 months    Recent Outpatient Visits          5 months ago Primary hypertension   [facility] Maple Hudson., MD   10 months ago Primary hypertension   [facility] Hudson., MD   1 year ago Annual physical exam   [facility] Maple Hudson., MD   1 year ago Essential (primary) hypertension   [facility] Maple Hudson., MD   2 years ago Class 2 severe obesity due to excess calories with serious comorbidity and body mass index (BMI) of 38.0 to 38.9 in adult [facility])   [facility] Maple Hudson., MD             . LORazepam (ATIVAN) 0.5 MG tablet [Pharmacy Med Name: LORAZEPAM 0.5 MG TAB] 30 tablet     Sig: TAKE ONE TABLET BY MOUTH TWICE A DAY AS NEEDED     Not Delegated - Psychiatry:  Anxiolytics/Hypnotics Failed - 02/27/2021  1:58 AM      Failed - This refill cannot be delegated      Failed - Urine Drug Screen completed in last 360 days      Passed - Valid encounter within last 6 months    Recent Outpatient Visits          5 months ago Primary hypertension   [facility] Maple Hudson., MD   10 months ago Primary hypertension   [facility] Maple Hudson., MD   1 year ago Annual physical exam   [facility] Maple Hudson., MD   1 year ago Essential (primary) hypertension   [facility] Maple Hudson., MD   2 years ago Class 2 severe obesity due to excess calories with serious comorbidity and body mass index  (BMI) of 38.0 to 38.9 in adult [facility])   [facility] Maple Hudson., MD             . levothyroxine (SYNTHROID) 25 MCG tablet [Pharmacy Med Name: LEVOTHYROXINE 25 MCG TAB[*]] 90 tablet     Sig: TAKE ONE TABLET BY MOUTH ONE TIME DAILY     Endocrinology:  Hypothyroid Agents Failed - 02/27/2021  1:58 AM      Failed - TSH needs to be rechecked within 3 months after an abnormal result. Refill until TSH is due.      Failed - TSH in normal range and within 360 days    TSH  Date Value Ref Range Status  03/13/2019 2.640 0.450 - 4.500 uIU/mL Final         Passed - Valid encounter within last 12 months    Recent Outpatient Visits          5 months ago Primary hypertension   [facility] Maple Hudson., MD   10 months ago Primary hypertension   [facility] Maple Hudson., MD   1 year ago  Annual physical exam   [facility] Maple Hudson., MD   1 year ago Essential (primary) hypertension   [facility] Maple Hudson., MD   2 years ago Class 2 severe obesity due to excess calories with serious comorbidity and body mass index (BMI) of 38.0 to 38.9 in adult [facility])   [facility] Maple Hudson., MD

## 2021-02-27 NOTE — Telephone Encounter (Signed)
Requested medication (s) are due for refill today: yes  Requested medication (s) are on the active medication list: yes  Last refill:  11/30/20 for both meds  Future visit scheduled: yes  Notes to clinic:  lorazepam: not delegated to NT to RF    levothyroxine: overdue blood work (TSH)   Requested Prescriptions  Pending Prescriptions Disp Refills   LORazepam (ATIVAN) 0.5 MG tablet [Pharmacy Med Name: LORAZEPAM 0.5 MG TAB] 30 tablet     Sig: TAKE ONE TABLET BY MOUTH TWICE A DAY AS NEEDED     Not Delegated - Psychiatry:  Anxiolytics/Hypnotics Failed - 02/27/2021  1:58 AM      Failed - This refill cannot be delegated      Failed - Urine Drug Screen completed in last 360 days      Passed - Valid encounter within last 6 months    Recent Outpatient Visits           5 months ago Primary hypertension   [facility] Maple Hudson., MD   10 months ago Primary hypertension   [facility] Maple Hudson., MD   1 year ago Annual physical exam   [facility] Maple Hudson., MD   1 year ago Essential (primary) hypertension   [facility] Maple Hudson., MD   2 years ago Class 2 severe obesity due to excess calories with serious comorbidity and body mass index (BMI) of 38.0 to 38.9 in adult [facility])   [facility] Maple Hudson., MD               levothyroxine (SYNTHROID) 25 MCG tablet [Pharmacy Med Name: LEVOTHYROXINE 25 MCG TAB[*]] 90 tablet     Sig: TAKE ONE TABLET BY MOUTH ONE TIME DAILY     Endocrinology:  Hypothyroid Agents Failed - 02/27/2021  1:58 AM      Failed - TSH needs to be rechecked within 3 months after an abnormal result. Refill until TSH is due.      Failed - TSH in normal range and within 360 days    TSH  Date Value Ref Range Status  03/13/2019 2.640 0.450 - 4.500 uIU/mL Final          Passed - Valid encounter within last 12 months    Recent Outpatient  Visits           5 months ago Primary hypertension   [facility] Maple Hudson., MD   10 months ago Primary hypertension   [facility] Maple Hudson., MD   1 year ago Annual physical exam   [facility] Maple Hudson., MD   1 year ago Essential (primary) hypertension   [facility] Maple Hudson., MD   2 years ago Class 2 severe obesity due to excess calories with serious comorbidity and body mass index (BMI) of 38.0 to 38.9 in adult [facility])   [facility] Maple Hudson., MD              Signed Prescriptions Disp Refills   PARoxetine (PAXIL) 40 MG tablet 90 tablet 0    Sig: TAKE ONE TABLET BY MOUTH ONE TIME DAILY     Psychiatry:  Antidepressants - SSRI Passed - 02/27/2021  1:58 AM      Passed - Completed PHQ-2 or PHQ-9 in the last 360 days      Passed -  Valid encounter within last 6 months    Recent Outpatient Visits           5 months ago Primary hypertension   [facility] Maple Hudson., MD   10 months ago Primary hypertension   [facility] Maple Hudson., MD   1 year ago Annual physical exam   [facility] Maple Hudson., MD   1 year ago Essential (primary) hypertension   [facility] Maple Hudson., MD   2 years ago Class 2 severe obesity due to excess calories with serious comorbidity and body mass index (BMI) of 38.0 to 38.9 in adult [facility])   [facility] Maple Hudson., MD

## 2021-02-28 NOTE — Telephone Encounter (Signed)
Please advise refill?  LOV: 09/02/2020 NOV: 03/10/2021 Last refill: 11/30/2020 #30 w/2 refills

## 2021-03-10 ENCOUNTER — Encounter: Payer: Self-pay | Admitting: Family Medicine

## 2021-03-14 ENCOUNTER — Encounter: Payer: PPO | Admitting: Family Medicine

## 2021-03-14 ENCOUNTER — Telehealth: Payer: Self-pay | Admitting: Family Medicine

## 2021-03-14 NOTE — Telephone Encounter (Signed)
[facility] faxed refill request for the following medications:  ALPRAZolam (XANAX) 0.5 MG tablet   Please advise.

## 2021-03-17 ENCOUNTER — Encounter: Payer: PPO | Admitting: Physician Assistant

## 2021-03-17 NOTE — Progress Notes (Deleted)
Complete physical exam   Patient: Latoya Cordova   DOB: April 12, 1945   75 y.o. Female  MRN: [id number] Visit Date: 03/17/2021  Today's healthcare provider: Alfredia Ferguson, PA-C   No chief complaint on file.  Subjective    Latoya Cordova is a 75 y.o. female who presents today for a complete physical exam.  She reports consuming a {diet types:17450} diet. {Exercise:19826} She generally feels {well/fairly well/poorly:18703}. She reports sleeping {well/fairly well/poorly:18703}. She {does/does not:200015} have additional problems to discuss today.  HPI  ***  Past Medical History:  Diagnosis Date   Anxiety    Diastolic dysfunction    a. echo 08/2014: EF 50-55%, mild MR; b. echo 03/2015: EF 60-65%, RWMA could not be excluded, GR1DD, ascending aorta mildly dilated at 53mm, normal right-sided pressure   Hyperlipidemia    Hypertension    Morbid obesity [facility])    Past Surgical History:  Procedure Laterality Date   ABDOMINAL HYSTERECTOMY  08/18/2004   APPENDECTOMY     Social History   Socioeconomic History   Marital status: Widowed    Spouse name: Not on file   Number of children: 1   Years of education: Not on file   Highest education level: Associate degree: occupational, Scientist, product/process development, or vocational program  Occupational History   Occupation: retired  Tobacco Use   Smoking status: Never   Smokeless tobacco: Never  Vaping Use   Vaping Use: Never used  Substance and Sexual Activity   Alcohol use: No   Drug use: No   Sexual activity: Not on file  Other Topics Concern   Not on file  Social History Narrative   Not on file   Social Determinants of Health   Financial Resource Strain: Not on file  Food Insecurity: Not on file  Transportation Needs: Not on file  Physical Activity: Not on file  Stress: Not on file  Social Connections: Not on file  Intimate Partner Violence: Not on file   Family Status  Relation Name Status   Father  Deceased at age 48       from  accident   PGF  Deceased   Mother  Deceased at age 49   Mat Aunt  (Not Specified)   Cousin  (Not Specified)   Family History  Problem Relation Age of Onset   Ulcers Father    Heart attack Paternal Grandfather    Hypertension Mother    Congestive Heart Failure Mother    Breast cancer Maternal Aunt    Breast cancer Cousin    Allergies  Allergen Reactions   Sulfa Antibiotics     Per Mother   Lodine  [Etodolac] Rash    Abdominal rash.    Patient Care Team: Maple Hudson., MD as PCP - General (Family Medicine) Antonieta Iba, MD as PCP - Cardiology (Cardiology)   Medications: Outpatient Medications Prior to Visit  Medication Sig   buPROPion (WELLBUTRIN XL) 150 MG 24 hr tablet TAKE ONE TABLET BY MOUTH ONE TIME DAILY   butalbital-aspirin-caffeine (FIORINAL) 50-325-40 MG capsule Take 1 capsule by mouth 2 (two) times daily as needed for headache.   furosemide (LASIX) 20 MG tablet TAKE 1 TABLET(20 MG) BY MOUTH EVERY MORNING   hydrochlorothiazide (HYDRODIURIL) 25 MG tablet Take 1 tablet (25 mg total) by mouth daily. Pt needs to make appt with provider for further refills   HYDROcodone-acetaminophen (NORCO/VICODIN) 5-325 MG tablet Take 1-2 tablets by mouth every 6 (six) hours as needed for moderate  pain.   levothyroxine (SYNTHROID) 25 MCG tablet TAKE ONE TABLET BY MOUTH ONE TIME DAILY   LORazepam (ATIVAN) 0.5 MG tablet TAKE ONE TABLET BY MOUTH TWICE A DAY AS NEEDED   PARoxetine (PAXIL) 40 MG tablet TAKE ONE TABLET BY MOUTH ONE TIME DAILY   rosuvastatin (CRESTOR) 10 MG tablet Take 1 tablet (10 mg total) by mouth daily.   No facility-administered medications prior to visit.    Review of Systems  Last CBC Lab Results  Component Value Date   WBC 10.4 03/13/2019   HGB 15.7 03/13/2019   HCT 44.6 03/13/2019   MCV 93 03/13/2019   MCH 32.6 03/13/2019   RDW 13.1 03/13/2019   PLT 211 03/13/2019   Last metabolic panel Lab Results  Component Value Date   GLUCOSE 109 (H)  04/22/2020   NA 141 04/22/2020   K 4.1 04/22/2020   CL 98 04/22/2020   CO2 27 04/22/2020   BUN 18 04/22/2020   CREATININE 0.90 04/22/2020   GFRNONAA 63 04/22/2020   CALCIUM 9.4 04/22/2020   PHOS 3.9 04/22/2015   PROT 7.5 04/22/2020   ALBUMIN 4.3 04/22/2020   LABGLOB 3.2 04/22/2020   AGRATIO 1.3 04/22/2020   BILITOT 0.3 04/22/2020   ALKPHOS 85 04/22/2020   AST 35 04/22/2020   ALT 31 04/22/2020   ANIONGAP 5 04/10/2015   Last lipids Lab Results  Component Value Date   CHOL 181 04/22/2020   HDL 44 04/22/2020   LDLCALC 107 (H) 04/22/2020   LDLDIRECT 104 (H) 04/22/2020   TRIG 173 (H) 04/22/2020   CHOLHDL 4.1 04/22/2020   Last hemoglobin A1c No results found for: HGBA1C Last thyroid functions Lab Results  Component Value Date   TSH 2.640 03/13/2019   Last vitamin D No results found for: 25OHVITD2, 25OHVITD3, VD25OH Last vitamin B12 and Folate No results found for: VITAMINB12, FOLATE    Objective    There were no vitals taken for this visit. BP Readings from Last 3 Encounters:  09/02/20 117/77  04/22/20 110/80  04/21/20 132/84   Wt Readings from Last 3 Encounters:  09/02/20 229 lb (103.9 kg)  04/22/20 221 lb (100.2 kg)  04/21/20 221 lb (100.2 kg)      Physical Exam  ***  Last depression screening scores PHQ 2/9 Scores 09/02/2020 04/21/2020 10/20/2019  PHQ - 2 Score 2 2 1   PHQ- 9 Score 6 5 3    Last fall risk screening Fall Risk  04/21/2020  Falls in the past year? 0  Comment -  Number falls in past yr: 0  Comment -  Injury with Fall? 0  Risk for fall due to : No Fall Risks  Follow up Falls evaluation completed   Last Audit-C alcohol use screening Alcohol Use Disorder Test (AUDIT) 04/21/2020  1. How often do you have a drink containing alcohol? 0  2. How many drinks containing alcohol do you have on a typical day when you are drinking? 0  3. How often do you have six or more drinks on one occasion? 0  AUDIT-C Score 0  Alcohol Brief  Interventions/Follow-up AUDIT Score <7 follow-up not indicated   A score of 3 or more in women, and 4 or more in men indicates increased risk for alcohol abuse, EXCEPT if all of the points are from question 1   No results found for any visits on 03/17/21.  Assessment & Plan    Routine Health Maintenance and Physical Exam  Exercise Activities and Dietary recommendations  Goals  Exercise 3x per week (30 min per time)     Recommend to start exercising 3 days a week for at least 30 minutes at a time.         Immunization History  Administered Date(s) Administered   Fluad Quad(high Dose 65+) 03/06/2019, 04/21/2020   Influenza, High Dose Seasonal PF 01/17/2017, 04/04/2018   Influenza-Unspecified 02/24/2013   Moderna Sars-Covid-2 Vaccination 06/04/2019, 07/02/2019, 03/30/2020   Pneumococcal Conjugate-13 08/08/2013   Pneumococcal Polysaccharide-23 05/24/2011, 01/20/2017   Tdap 12/13/2006    Health Maintenance  Topic Date Due   Hepatitis C Screening  Never done   Zoster Vaccines- Shingrix (1 of 2) Never done   TETANUS/TDAP  12/12/2016   COVID-19 Vaccine (4 - Booster for Moderna series) 05/25/2020   INFLUENZA VACCINE  10/25/2020   Pneumonia Vaccine 41+ Years old  Completed   DEXA SCAN  Completed   HPV VACCINES  Aged Out   COLONOSCOPY (Pts 45-74yrs Insurance coverage will need to be confirmed)  Discontinued    Discussed health benefits of physical activity, and encouraged her to engage in regular exercise appropriate for her age and condition.  ***  No follow-ups on file.     {provider attestation***:1}   Alfredia Ferguson, PA-C  [facility] [phone] (phone) [phone] (fax)  [facility]

## 2021-04-18 ENCOUNTER — Other Ambulatory Visit: Payer: Self-pay | Admitting: Family Medicine

## 2021-04-18 DIAGNOSIS — F411 Generalized anxiety disorder: Secondary | ICD-10-CM

## 2021-04-19 NOTE — Telephone Encounter (Signed)
LOV

## 2021-04-19 NOTE — Telephone Encounter (Signed)
Requested medications are due for refill today.  unsure  Requested medications are on the active medications list.  yes  Last refill. 03/01/2021  Future visit scheduled.   no  Notes to clinic.  Medication not delegated.    Requested Prescriptions  Pending Prescriptions Disp Refills   LORazepam (ATIVAN) 0.5 MG tablet [Pharmacy Med Name: LORAZEPAM 0.5 MG TAB] 15 tablet 0    Sig: TAKE ONE TABLET BY MOUTH TWICE A DAY AS NEEDED     Not Delegated - Psychiatry:  Anxiolytics/Hypnotics Failed - 04/18/2021  7:09 PM      Failed - This refill cannot be delegated      Failed - Urine Drug Screen completed in last 360 days      Failed - Valid encounter within last 6 months    Recent Outpatient Visits           7 months ago Primary hypertension   [facility] Maple Hudson., MD   12 months ago Primary hypertension   [facility] Maple Hudson., MD   1 year ago Annual physical exam   [facility] Maple Hudson., MD   2 years ago Essential (primary) hypertension   [facility] Maple Hudson., MD   2 years ago Class 2 severe obesity due to excess calories with serious comorbidity and body mass index (BMI) of 38.0 to 38.9 in adult [facility])   [facility] Maple Hudson., MD

## 2021-05-28 ENCOUNTER — Other Ambulatory Visit: Payer: Self-pay | Admitting: Cardiovascular Disease

## 2021-05-28 ENCOUNTER — Other Ambulatory Visit: Payer: Self-pay | Admitting: Family Medicine

## 2021-05-28 DIAGNOSIS — I10 Essential (primary) hypertension: Secondary | ICD-10-CM

## 2021-05-28 DIAGNOSIS — F3289 Other specified depressive episodes: Secondary | ICD-10-CM

## 2021-06-20 ENCOUNTER — Telehealth: Payer: Self-pay | Admitting: Family Medicine

## 2021-06-20 ENCOUNTER — Other Ambulatory Visit: Payer: Self-pay

## 2021-06-20 DIAGNOSIS — F411 Generalized anxiety disorder: Secondary | ICD-10-CM

## 2021-06-20 MED ORDER — LORAZEPAM 0.5 MG PO TABS: 0.5000 mg | ORAL_TABLET | Freq: Two times a day (BID) | ORAL | 0 refills | Status: DC | PRN

## 2021-06-20 NOTE — Telephone Encounter (Signed)
[facility] faxed refill request for the following medications:  LORazepam (ATIVAN) 0.5 MG tablet   Please advise.  

## 2021-06-20 NOTE — Telephone Encounter (Signed)
LOV 09/02/20 ?NOV none ?LRF 03/01/21 15 x 0  ?

## 2021-06-20 NOTE — Telephone Encounter (Signed)
Converted to refill request and sent to provider °

## 2021-08-24 ENCOUNTER — Telehealth: Payer: Self-pay | Admitting: Family Medicine

## 2021-08-24 NOTE — Telephone Encounter (Signed)
Copied from CRM [id number]. Topic: Medicare AWV >> Aug 24, 2021  1:30 PM Claudette Laws R wrote: Reason for CRM:  No answer unable to leave a message for patient to call back and schedule Medicare Annual Wellness Visit (AWV) in office.   If unable to come into the office for AWV,  please offer to do virtually or by telephone.  Last AWV:  10/03/2018  Please schedule at anytime with [facility] Health Advisor.  30 minute appointment for Virtual or phone 45 minute appointment for in office or Initial virtual/phone  Any questions, please contact me at [phone]

## 2021-08-30 ENCOUNTER — Other Ambulatory Visit: Payer: Self-pay | Admitting: Family Medicine

## 2021-09-02 ENCOUNTER — Ambulatory Visit (INDEPENDENT_AMBULATORY_CARE_PROVIDER_SITE_OTHER): Payer: PPO | Admitting: Family Medicine

## 2021-09-02 ENCOUNTER — Encounter: Payer: Self-pay | Admitting: Family Medicine

## 2021-09-02 VITALS — BP 174/81 | HR 95 | Temp 97.6°F | Resp 20 | Wt 219.0 lb

## 2021-09-02 DIAGNOSIS — G8929 Other chronic pain: Secondary | ICD-10-CM | POA: Diagnosis not present

## 2021-09-02 DIAGNOSIS — M1990 Unspecified osteoarthritis, unspecified site: Secondary | ICD-10-CM | POA: Diagnosis not present

## 2021-09-02 DIAGNOSIS — R739 Hyperglycemia, unspecified: Secondary | ICD-10-CM | POA: Diagnosis not present

## 2021-09-02 DIAGNOSIS — R519 Headache, unspecified: Secondary | ICD-10-CM

## 2021-09-02 DIAGNOSIS — I10 Essential (primary) hypertension: Secondary | ICD-10-CM | POA: Diagnosis not present

## 2021-09-02 DIAGNOSIS — F411 Generalized anxiety disorder: Secondary | ICD-10-CM

## 2021-09-02 DIAGNOSIS — E785 Hyperlipidemia, unspecified: Secondary | ICD-10-CM

## 2021-09-02 DIAGNOSIS — E039 Hypothyroidism, unspecified: Secondary | ICD-10-CM

## 2021-09-02 DIAGNOSIS — K219 Gastro-esophageal reflux disease without esophagitis: Secondary | ICD-10-CM

## 2021-09-02 DIAGNOSIS — M8580 Other specified disorders of bone density and structure, unspecified site: Secondary | ICD-10-CM

## 2021-09-02 DIAGNOSIS — F32A Depression, unspecified: Secondary | ICD-10-CM | POA: Diagnosis not present

## 2021-09-02 MED ORDER — LORAZEPAM 0.5 MG PO TABS: 0.5000 mg | ORAL_TABLET | Freq: Two times a day (BID) | ORAL | 0 refills | Status: DC | PRN

## 2021-09-02 MED ORDER — HYDROCODONE-ACETAMINOPHEN 5-325 MG PO TABS: 1.0000 | ORAL_TABLET | Freq: Four times a day (QID) | ORAL | 0 refills | Status: DC | PRN

## 2021-09-02 MED ORDER — BUTALBITAL-ASPIRIN-CAFFEINE 50-325-40 MG PO CAPS: 1.0000 | ORAL_CAPSULE | Freq: Two times a day (BID) | ORAL | 2 refills | Status: AC | PRN

## 2021-09-02 NOTE — Progress Notes (Signed)
I,[name],acting as a scribe for [name], MD.,have documented all relevant documentation on the behalf of [name], MD,as directed by  [name], MD while in the presence of [name], MD.   Established patient visit   Patient: Latoya Cordova   DOB: [birth year]   76 y.o. Female  MRN: [id number] Visit Date: 09/02/2021  Today's healthcare provider: [name], MD   Chief Complaint  Patient presents with   Depression   Anxiety   Subjective    HPI  Anxiety and Depression, Follow-up  She was last seen for anxiety 1  year  ago. Changes made at last visit include none; continue same treatment (Wellbutrin and paroxetine).   She reports fair compliance with treatment. She reports good tolerance of treatment. Ran out of Lorazepam more than 1 month ago.  She is not having side effects.   She feels her anxiety is  Worse since last visit. Patient would like to have medication dosages increased.   Symptoms: No chest pain No difficulty concentrating  Yes dizziness Yes fatigue  No feelings of losing control No insomnia  No irritable No palpitations  No panic attacks No racing thoughts  Yes shortness of breath No sweating  No tremors/shakes    GAD-7 Results    09/02/2021   10:05 AM 09/02/2020    3:06 PM  GAD-7 Generalized Anxiety Disorder Screening Tool  1. Feeling Nervous, Anxious, or on Edge 3 1  2. Not Being Able to Stop or Control Worrying 3 1  3. Worrying Too Much About Different Things 3 1  4. Trouble Relaxing 3 0  5. Being So Restless it's Hard To Sit Still 0 0  6. Becoming Easily Annoyed or Irritable 0 0  7. Feeling Afraid As If Something Awful Might Happen 3 0  Total GAD-7 Score 15 3  Difficulty At Work, Home, or Getting  Along With Others? Somewhat difficult Not difficult at all    PHQ-9 Scores    09/02/2021    9:55 AM 09/02/2020    3:06 PM 04/21/2020    3:06 PM  PHQ9 SCORE ONLY  PHQ-9 Total Score 14 6 5    She states she has  recently had some unusually stressful events in her personal life, but would not elaborate. She is tolerating current medications, but states she feels like she needs to see a psychiatrist. She was apparently referred last year but referral fell through for unclear reasons.  ---------------------------------------------------------------------------------------------------  Hypertension, follow-up  BP Readings from Last 3 Encounters:  09/02/21 (!) 174/81  09/02/20 117/77  04/22/20 110/80   Wt Readings from Last 3 Encounters:  09/02/21 219 lb (99.3 kg)  09/02/20 229 lb (103.9 kg)  04/22/20 221 lb (100.2 kg)     She was last seen for hypertension 1  year  ago.  BP at that visit was 117/77. Management since that visit includes continuing same treatment.  She reports good compliance with treatment. However she states she has not taken her medications today.  She is not having side effects.  She is following a Regular diet. She is not exercising. She does not smoke.  Use of agents associated with hypertension: thyroid hormones.   Outside blood pressures are not checked. Symptoms: No chest pain No chest pressure  No palpitations No syncope  Yes dyspnea Yes orthopnea  No paroxysmal nocturnal dyspnea Yes lower extremity edema   Pertinent labs Lab Results  Component Value Date   CHOL 181 04/22/2020  HDL 44 04/22/2020   LDLCALC 107 (H) 04/22/2020   LDLDIRECT 104 (H) 04/22/2020   TRIG 173 (H) 04/22/2020   CHOLHDL 4.1 04/22/2020   Lab Results  Component Value Date   NA 141 04/22/2020   K 4.1 04/22/2020   CREATININE 0.90 04/22/2020   GFRNONAA 63 04/22/2020   GLUCOSE 109 (H) 04/22/2020   TSH 2.640 03/13/2019     The 10-year ASCVD risk score (Arnett DK, et al., 2019) is: 41.6%  ---------------------------------------------------------------------------------------------------    Medications: Outpatient Medications Prior to Visit  Medication Sig   buPROPion (WELLBUTRIN XL)  150 MG 24 hr tablet TAKE ONE TABLET BY MOUTH ONE TIME DAILY   furosemide (LASIX) 20 MG tablet TAKE 1 TABLET(20 MG) BY MOUTH EVERY MORNING   hydrochlorothiazide (HYDRODIURIL) 25 MG tablet Take 1 tablet (25 mg total) by mouth daily. PLEASE SCHEDULE OFFICE VISIT FOR FURTHER REFILLS. THANK YOU. FINAL ATTEMPT.   levothyroxine (SYNTHROID) 25 MCG tablet TAKE ONE TABLET BY MOUTH ONE TIME DAILY   LORazepam (ATIVAN) 0.5 MG tablet Take 1 tablet (0.5 mg total) by mouth 2 (two) times daily as needed.   PARoxetine (PAXIL) 40 MG tablet TAKE ONE TABLET BY MOUTH ONE TIME DAILY   rosuvastatin (CRESTOR) 10 MG tablet TAKE ONE TABLET BY MOUTH ONE TIME DAILY   butalbital-aspirin-caffeine (FIORINAL) 50-325-40 MG capsule Take 1 capsule by mouth 2 (two) times daily as needed for headache. (Patient not taking: Reported on 09/02/2021)   HYDROcodone-acetaminophen (NORCO/VICODIN) 5-325 MG tablet Take 1-2 tablets by mouth every 6 (six) hours as needed for moderate pain. (Patient not taking: Reported on 09/02/2021)   No facility-administered medications prior to visit.    Review of Systems  Constitutional:  Positive for fatigue. Negative for appetite change, chills and fever.  Respiratory:  Positive for shortness of breath. Negative for chest tightness.   Cardiovascular:  Positive for leg swelling. Negative for chest pain and palpitations.  Gastrointestinal:  Negative for abdominal pain, nausea and vomiting.  Neurological:  Positive for dizziness. Negative for weakness.  Psychiatric/Behavioral:  Positive for dysphoric mood. The patient is nervous/anxious.        Objective    BP (!) 174/81 (BP Location: Right Arm, Patient Position: Sitting, Cuff Size: Large)   Pulse 95   Temp 97.6 F (36.4 C) (Oral)   Resp 20   Wt 219 lb (99.3 kg)   SpO2 97% Comment: room air  BMI 38.79 kg/m   Today's Vitals   09/02/21 0950 09/02/21 1003  BP: (!) 159/107 (!) 174/81  Pulse: 95   Resp: 20   Temp: 97.6 F (36.4 C)   TempSrc: Oral    SpO2: 97%   Weight: 219 lb (99.3 kg)    Body mass index is 38.79 kg/m.    Physical Exam   General: Appearance:    Mildly obese female in no acute distress  Eyes:    PERRL, conjunctiva/corneas clear, EOM's intact       Lungs:     Clear to auscultation bilaterally, respirations unlabored  Heart:    Normal heart rate. Normal rhythm.  2/6 systolic murmur at right upper sternal border   MS:   All extremities are intact.    Neurologic:   Awake, alert, oriented x 3. No apparent focal neurological defect.         Assessment & Plan     Patient of Dr. Alben Spittle who I am seeing for the first time as she is running out of medications and cannot wait for open  appointment slot with Dr. Rosanna Randy.   1. Chronic nonintractable headache, unspecified headache type refill butalbital-aspirin-caffeine (FIORINAL) 50-325-40 MG capsule; Take 1 capsule by mouth 2 (two) times daily as needed for headache.  Dispense: 50 capsule; Refill: 2  She reports she takes this rarely.   2. Anxiety, generalized She states lorazepam is effective, but takes rarely.   refill LORazepam (ATIVAN) 0.5 MG tablet; Take 1 tablet (0.5 mg total) by mouth 2 (two) times daily as needed.  Dispense: 30 tablet; Refill: 0  She has had increased stress and anxiety in her personal life and request psychiatry referral.  - Ambulatory referral to Psychiatry   3. Depression, unspecified depression type - Ambulatory referral to Psychiatry  4. Primary hypertension Elevated today, but she states she did not take her BP medication, which is usually prescribed by Dr. Rockey Situ. Advised she needs to schedule a follow up with him.   5. Adult hypothyroidism Due to check.  TSH  6. Hyperlipidemia, unspecified hyperlipidemia type She is tolerating rosuvastatin well with no adverse effects.   - CBC - Comprehensive metabolic panel - Lipid panel  7. Hyperglycemia  - Hemoglobin A1c  8. Gastroesophageal reflux disease without  esophagitis Currently managed with diet.   9. Osteopenia, unspecified location  - VITAMIN D 25 Hydroxy (Vit-D Deficiency, Fractures)  10. Osteoarthritis, unspecified osteoarthritis type, unspecified site refill HYDROcodone-acetaminophen (NORCO/VICODIN) 5-325 MG tablet; Take 1-2 tablets by mouth every 6 (six) hours as needed for moderate pain.  Dispense: 50 tablet; Refill: 0  She reports she takes this rare, but it is effective when she does take it.      The entirety of the information documented in the History of Present Illness, Review of Systems and Physical Exam were personally obtained by me. Portions of this information were initially documented by the CMA and reviewed by me for thoroughness and accuracy.     [name], MD  [facility] [phone] (phone) [phone] (fax)  [place]

## 2021-09-03 LAB — COMPREHENSIVE METABOLIC PANEL
ALT: 21 IU/L (ref 0–32)
AST: 41 IU/L — ABNORMAL HIGH (ref 0–40)
Albumin/Globulin Ratio: 1.5 (ref 1.2–2.2)
Albumin: 4.6 g/dL (ref 3.7–4.7)
Alkaline Phosphatase: 81 IU/L (ref 44–121)
BUN/Creatinine Ratio: 14 (ref 12–28)
BUN: 12 mg/dL (ref 8–27)
Bilirubin Total: 0.5 mg/dL (ref 0.0–1.2)
CO2: 25 mmol/L (ref 20–29)
Calcium: 9.7 mg/dL (ref 8.7–10.3)
Chloride: 103 mmol/L (ref 96–106)
Creatinine, Ser: 0.86 mg/dL (ref 0.57–1.00)
Globulin, Total: 3 g/dL (ref 1.5–4.5)
Glucose: 105 mg/dL — ABNORMAL HIGH (ref 70–99)
Potassium: 4.5 mmol/L (ref 3.5–5.2)
Sodium: 143 mmol/L (ref 134–144)
Total Protein: 7.6 g/dL (ref 6.0–8.5)
eGFR: 70 mL/min/{1.73_m2} (ref >59)

## 2021-09-03 LAB — LIPID PANEL
Chol/HDL Ratio: 2.7 ratio (ref 0.0–4.4)
Cholesterol, Total: 141 mg/dL (ref 100–199)
HDL: 52 mg/dL (ref >39)
LDL Chol Calc (NIH): 71 mg/dL (ref 0–99)
Triglycerides: 99 mg/dL (ref 0–149)
VLDL Cholesterol Cal: 18 mg/dL (ref 5–40)

## 2021-09-03 LAB — HEMOGLOBIN A1C
Est. average glucose Bld gHb Est-mCnc: 120 mg/dL
Hgb A1c MFr Bld: 5.8 % — ABNORMAL HIGH (ref 4.8–5.6)

## 2021-09-03 LAB — CBC
Hematocrit: 46.5 % (ref 34.0–46.6)
Hemoglobin: 15.6 g/dL (ref 11.1–15.9)
MCH: 30.8 pg (ref 26.6–33.0)
MCHC: 33.5 g/dL (ref 31.5–35.7)
MCV: 92 fL (ref 79–97)
Platelets: 180 10*3/uL (ref 150–450)
RBC: 5.06 x10E6/uL (ref 3.77–5.28)
RDW: 13 % (ref 11.7–15.4)
WBC: 8.7 10*3/uL (ref 3.4–10.8)

## 2021-09-03 LAB — TSH: TSH: 3.73 u[IU]/mL (ref 0.450–4.500)

## 2021-09-03 LAB — VITAMIN D 25 HYDROXY (VIT D DEFICIENCY, FRACTURES): Vit D, 25-Hydroxy: <4 ng/mL — ABNORMAL LOW (ref 30.0–100.0)

## 2021-09-07 ENCOUNTER — Telehealth: Payer: Self-pay | Admitting: Family Medicine

## 2021-09-07 DIAGNOSIS — E038 Other specified hypothyroidism: Secondary | ICD-10-CM

## 2021-09-07 MED ORDER — LEVOTHYROXINE SODIUM 25 MCG PO TABS: 25.0000 ug | ORAL_TABLET | Freq: Every day | ORAL | 1 refills | Status: DC

## 2021-09-07 NOTE — Telephone Encounter (Signed)
[facility] faxed refill request for the following medications:  levothyroxine (SYNTHROID) 25 MCG tablet   Please advise.

## 2021-09-25 ENCOUNTER — Other Ambulatory Visit: Payer: Self-pay | Admitting: Family Medicine

## 2021-09-25 DIAGNOSIS — F411 Generalized anxiety disorder: Secondary | ICD-10-CM

## 2021-10-07 ENCOUNTER — Telehealth: Payer: Self-pay | Admitting: Cardiovascular Disease

## 2021-10-07 NOTE — Telephone Encounter (Signed)
3 attempts to schedule fu appt from recall list.   Deleting recall.   

## 2021-10-13 DIAGNOSIS — H25813 Combined forms of age-related cataract, bilateral: Secondary | ICD-10-CM | POA: Diagnosis not present

## 2021-11-03 ENCOUNTER — Telehealth: Payer: Self-pay | Admitting: Family Medicine

## 2021-11-03 NOTE — Telephone Encounter (Signed)
Copied from CRM [id number]. Topic: Medicare AWV >> Nov 03, 2021 11:58 AM Zannie Kehr wrote: Reason for CRM:  No answer unable to leave a message for patient to call back and schedule Medicare Annual Wellness Visit (AWV) in office.   If unable to come into the office for AWV,  please offer to do virtually or by telephone.  Last AWV: 10/03/2018  Please schedule at anytime with [facility] Health Advisor.  30 minute appointment for Virtual or phone 45 minute appointment for in office or Initial virtual/phone  Any questions, please contact me at [phone]

## 2021-11-24 ENCOUNTER — Other Ambulatory Visit: Payer: Self-pay | Admitting: Family Medicine

## 2021-11-24 DIAGNOSIS — F3289 Other specified depressive episodes: Secondary | ICD-10-CM

## 2021-12-12 NOTE — Progress Notes (Unsigned)
Cardiology Office Note    Date:  12/14/2021   ID:  Latoya BenderSylvia K Macbeth, DOB January 20, 1946, MRN [id number]  PCP:  Maple HudsonGilbert, Richard L Jr., MD  Cardiologist:  Julien Nordmannimothy Gollan, MD  Electrophysiologist:  None   Chief Complaint: Follow-up  History of Present Illness:   Latoya Cordova is a 76 y.o. female with history of HFpEF, HTN, HLD, obesity, and anxiety who presents for follow up of her HFpEF.    Prior echo from 08/2014 showed an EF of 50-55% and mild mitral regurgitation. She was admitted to the hospital in 03/2015 with acute respiratory distress felt to be secondary to viral pneumonitis vs PNA. Echo at that time showed an EF of 60-65%, mild focal and moderate concentric LVH, LVOT gradient of 41 mmHg with Valsalva, Gr1DD, mildly dilated ascending aorta measuring 37 mm, normal RVSF and PASP. CTA chest was negative for PE, unremarkable thoracic aorta, and there were no significant coronary artery calcifications or aortic atherosclerosis noted. She was treated with Levaquin with symptom improvement. She was seen virtually in 10/2018, noting chronic SOB, "due to weight," and was taking Lasix every other day. There was high fluid intake and it was noted she would eat when bored. No changes were made at that time.  She did have a mechanical fall on 01/20/2020, while squatting down to put flowers on her husband's grave and scraped her knee. No LOC and she did not hit her head.  She was seen in the office on 01/28/2020 and was doing well from a cardiac perspective.  She noted a 1 to 2-year history of progressive exertional dyspnea.  She did feel like her sedentary lifestyle was contributing to this.  Echo on 02/11/2020 showed an EF of 55 to 60%, no regional wall motion abnormalities, grade 1 diastolic dysfunction, normal RV systolic function, estimated right atrial pressure of 3 mmHg, and no significant valvular abnormalities.  She was seen in the office on 02/24/2020 and was doing well from a cardiac perspective.  She  continued to note a stable 1 to 2-year history of progressive exertional dyspnea without chest pain.  In this setting she underwent Lexiscan MPI on 03/01/2020 which showed no significant ischemia or scar and was overall low risk.  CT attenuation corrected images showed evidence of aortic atherosclerosis with minimal coronary artery calcification.  She was last seen in the office in 03/2020 and was without symptoms of angina or decompensation with chronic stable dyspnea.  She comes in doing reasonably well from a cardiac perspective.  She is without symptoms of angina or decompensation.  She is under increased stress as her son has had to move back in with her.  She also indicates he has a pending court date which has significantly stressed her.  She believes this is contributing to her elevated BP readings.  No significant lower extremity swelling or orthopnea.  Weight remains stable.  No dizziness, presyncope, or syncope.   Labs independently reviewed: 08/2021 - A1c 5.8, TSH normal, TC 141, TG 99, HDL 52, LDL 71, BUN 12, serum creatinine 0.86, potassium 4.5, albumin 4.6, AST 41, ALT normal, Hgb 15.6, PLT 180  Past Medical History:  Diagnosis Date   Anxiety    Diastolic dysfunction    a. echo 08/2014: EF 50-55%, mild MR; b. echo 03/2015: EF 60-65%, RWMA could not be excluded, GR1DD, ascending aorta mildly dilated at 37mm, normal right-sided pressure   Hyperlipidemia    Hypertension    Morbid obesity (HCC)     Past  Surgical History:  Procedure Laterality Date   ABDOMINAL HYSTERECTOMY  08/18/2004   APPENDECTOMY      Current Medications: Current Meds  Medication Sig   amLODipine (NORVASC) 5 MG tablet Take 1 tablet (5 mg total) by mouth daily.   buPROPion (WELLBUTRIN XL) 150 MG 24 hr tablet TAKE ONE TABLET BY MOUTH ONE TIME DAILY   butalbital-aspirin-caffeine (FIORINAL) 50-325-40 MG capsule Take 1 capsule by mouth 2 (two) times daily as needed for headache.   Cholecalciferol (VITAMIN D) 50 MCG  (2000 UT) CAPS Take 1 capsule by mouth daily.   furosemide (LASIX) 20 MG tablet TAKE 1 TABLET(20 MG) BY MOUTH EVERY MORNING   hydrochlorothiazide (HYDRODIURIL) 25 MG tablet Take 1 tablet (25 mg total) by mouth daily. PLEASE SCHEDULE OFFICE VISIT FOR FURTHER REFILLS. THANK YOU. FINAL ATTEMPT.   HYDROcodone-acetaminophen (NORCO/VICODIN) 5-325 MG tablet Take 1-2 tablets by mouth every 6 (six) hours as needed for moderate pain.   levothyroxine (SYNTHROID) 25 MCG tablet Take 1 tablet (25 mcg total) by mouth daily.   LORazepam (ATIVAN) 0.5 MG tablet TAKE ONE TABLET BY MOUTH TWICE A DAY AS NEEDED   PARoxetine (PAXIL) 40 MG tablet TAKE ONE TABLET BY MOUTH ONE TIME DAILY **MUST CALL DR. FOR APPOINTMENT FOR FURTHER REFILLS**   rosuvastatin (CRESTOR) 10 MG tablet TAKE ONE TABLET BY MOUTH ONE TIME DAILY    Allergies:   Sulfa antibiotics and Lodine  [etodolac]   Social History   Socioeconomic History   Marital status: Widowed    Spouse name: Not on file   Number of children: 1   Years of education: Not on file   Highest education level: Associate degree: occupational, Scientist, product/process development, or vocational program  Occupational History   Occupation: retired  Tobacco Use   Smoking status: Never   Smokeless tobacco: Never  Vaping Use   Vaping Use: Never used  Substance and Sexual Activity   Alcohol use: No   Drug use: No   Sexual activity: Not on file  Other Topics Concern   Not on file  Social History Narrative   Not on file   Social Determinants of Health   Financial Resource Strain: Low Risk  (07/09/2017)   Overall Financial Resource Strain (CARDIA)    Difficulty of Paying Living Expenses: Not hard at all  Food Insecurity: No Food Insecurity (07/09/2017)   Hunger Vital Sign    Worried About Running Out of Food in the Last Year: Never true    Ran Out of Food in the Last Year: Never true  Transportation Needs: No Transportation Needs (07/09/2017)   PRAPARE - Administrator, Civil Service  (Medical): No    Lack of Transportation (Non-Medical): No  Physical Activity: Inactive (10/03/2018)   Exercise Vital Sign    Days of Exercise per Week: 0 days    Minutes of Exercise per Session: 0 min  Stress: Stress Concern Present (07/09/2017)   [employer] of Occupational Health - Occupational Stress Questionnaire    Feeling of Stress : To some extent  Social Connections: Unknown (10/03/2018)   Social Connection and Isolation Panel [NHANES]    Frequency of Communication with Friends and Family: Patient refused    Frequency of Social Gatherings with Friends and Family: Patient refused    Attends Religious Services: Patient refused    Database administrator or Organizations: Patient refused    Attends Banker Meetings: Patient refused    Marital Status: Patient refused     Family History:  The patient's family history includes Breast cancer in her cousin and maternal aunt; Congestive Heart Failure in her mother; Heart attack in her paternal grandfather; Hypertension in her mother; Ulcers in her father.  ROS:   12-point review of systems is negative unless otherwise noted in HPI.   EKGs/Labs/Other Studies Reviewed:    Studies reviewed were summarized above. The additional studies were reviewed today:  2D echo 08/2014: - Left ventricle: The cavity size was normal. Systolic function was    normal. The estimated ejection fraction was in the range of 50%    to 55%.  - Mitral valve: There was mild regurgitation. __________   2D echo 03/2015: - Left ventricle: The cavity size was normal. There was mild focal    basal and moderate concentric hypertrophy of the septum. There is    LVOT gradient of 41 mm Hg with valsalva (not well visualized)    Systolic function was normal. The estimated ejection fraction was    in the range of 60% to 65%. Regional wall motion abnormalities    cannot be excluded. Doppler parameters are consistent with    abnormal left ventricular  relaxation (grade 1 diastolic    dysfunction).  - Aorta: Ascending aorta is mildly dilated, diameter: 37 mm (S).  - Left atrium: The atrium was normal in size.  - Right ventricle: Systolic function was normal.  - Pulmonary arteries: Systolic pressure was within the normal    range.   Impressions:   - Challenging image quality. __________   2D echo 02/11/2020: 1. Left ventricular ejection fraction, by estimation, is 55 to 60%. The  left ventricle has normal function. The left ventricle has no regional  wall motion abnormalities. Left ventricular diastolic parameters are  consistent with Grade I diastolic  dysfunction (impaired relaxation).   2. Right ventricular systolic function is normal. The right ventricular  size is not well visualized.   3. The mitral valve is grossly normal. No evidence of mitral valve  regurgitation.   4. The aortic valve was not well visualized. Aortic valve regurgitation  not assessed.   5. The inferior vena cava is normal in size with greater than 50%  respiratory variability, suggesting right atrial pressure of 3 mmHg. __________   Carlton Adam MPI 03/01/2020: There was no ST segment deviation noted during stress. No T wave inversion was noted during stress. The study is normal. This is a low risk study. The left ventricular ejection fraction is hyperdynamic (>65%). CT attenuation images show evidence of aortic calcifications but minimal coronary calcifications.    EKG:  EKG is ordered today.  The EKG ordered today demonstrates NSR, 81 bpm, left axis deviation, baseline wandering, poor R wave progression along the precordial leads, no acute ST-T changes, largely consistent with prior tracing  Recent Labs: 09/02/2021: ALT 21; BUN 12; Creatinine, Ser 0.86; Hemoglobin 15.6; Platelets 180; Potassium 4.5; Sodium 143; TSH 3.730  Recent Lipid Panel    Component Value Date/Time   CHOL 141 09/02/2021 1100   TRIG 99 09/02/2021 1100   HDL 52 09/02/2021 1100    CHOLHDL 2.7 09/02/2021 1100   CHOLHDL 3.8 02/19/2017 1445   LDLCALC 71 09/02/2021 1100   LDLCALC 109 (H) 02/19/2017 1445   LDLDIRECT 104 (H) 04/22/2020 1411    PHYSICAL EXAM:    VS:  BP (!) 150/80 (BP Location: Left Arm, Patient Position: Sitting, Cuff Size: Large)   Pulse 81   Ht 5\' 3"  (1.6 m)   Wt 218 lb (98.9 kg)  SpO2 95%   BMI 38.62 kg/m   BMI: Body mass index is 38.62 kg/m.  Physical Exam Vitals reviewed.  Constitutional:      Appearance: She is well-developed.  HENT:     Head: Normocephalic and atraumatic.  Eyes:     General:        Right eye: No discharge.        Left eye: No discharge.  Neck:     Vascular: No JVD.  Cardiovascular:     Rate and Rhythm: Normal rate and regular rhythm.     Pulses:          Posterior tibial pulses are 2+ on the right side and 2+ on the left side.     Heart sounds: Normal heart sounds, S1 normal and S2 normal. Heart sounds not distant. No midsystolic click and no opening snap. No murmur heard.    No friction rub.  Pulmonary:     Effort: Pulmonary effort is normal. No respiratory distress.     Breath sounds: Normal breath sounds. No decreased breath sounds, wheezing or rales.  Chest:     Chest wall: No tenderness.  Abdominal:     General: There is no distension.  Musculoskeletal:     Cervical back: Normal range of motion.     Right lower leg: No edema.     Left lower leg: No edema.  Skin:    General: Skin is warm and dry.     Nails: There is no clubbing.  Neurological:     Mental Status: She is alert and oriented to person, place, and time.  Psychiatric:        Speech: Speech normal.        Behavior: Behavior normal.        Thought Content: Thought content normal.        Judgment: Judgment normal.     Wt Readings from Last 3 Encounters:  12/14/21 218 lb (98.9 kg)  12/13/21 217 lb (98.4 kg)  09/02/21 219 lb (99.3 kg)     ASSESSMENT & PLAN:   HFpEF: She appears euvolemic and well compensated.  She remains on a  low-dose furosemide.  Could consider addition of SGLT2 inhibitor or MRA in follow-up once home stressors have improved some, in an effort to minimize healthcare burden on her with follow-up venipunctures.  HTN: Blood pressure elevated at triage at 150/80.  This is largely consistent with more recent readings.  Blood pressure was well controlled at 110/80 at her last visit with [place].  Suspect her elevated BP readings are in the setting of significant stress at home.  Add amlodipine 5 mg daily.  Otherwise, she remains on HCTZ.  Low-sodium diet is encouraged.  HLD: LDL 71 in 08/2021 with normal ALT.  She remains on rosuvastatin 10 mg.  Followed by PCP.  Obesity with physical deconditioning: Her weight is down 3 pounds when compared to her last clinic visit in our office.  Weight loss is encouraged heartily diet and regular exercise.    Disposition: F/u with Dr. Mariah Milling or an APP in 4 months.   Medication Adjustments/Labs and Tests Ordered: Current medicines are reviewed at length with the patient today.  Concerns regarding medicines are outlined above. Medication changes, Labs and Tests ordered today are summarized above and listed in the Patient Instructions accessible in Encounters.   Signed, Eula Listen, PA-C 12/14/2021 8:57 AM     [facility] [address] [place], [place] [address] [phone]  [phone] 

## 2021-12-13 ENCOUNTER — Encounter: Payer: Self-pay | Admitting: Family Medicine

## 2021-12-13 ENCOUNTER — Ambulatory Visit (INDEPENDENT_AMBULATORY_CARE_PROVIDER_SITE_OTHER): Payer: PPO | Admitting: Family Medicine

## 2021-12-13 VITALS — BP 148/79 | HR 87 | Resp 16 | Wt 217.0 lb

## 2021-12-13 DIAGNOSIS — I10 Essential (primary) hypertension: Secondary | ICD-10-CM | POA: Diagnosis not present

## 2021-12-13 DIAGNOSIS — M1990 Unspecified osteoarthritis, unspecified site: Secondary | ICD-10-CM | POA: Diagnosis not present

## 2021-12-13 DIAGNOSIS — M797 Fibromyalgia: Secondary | ICD-10-CM | POA: Diagnosis not present

## 2021-12-13 DIAGNOSIS — E039 Hypothyroidism, unspecified: Secondary | ICD-10-CM | POA: Diagnosis not present

## 2021-12-13 MED ORDER — HYDROCODONE-ACETAMINOPHEN 5-325 MG PO TABS: 1.0000 | ORAL_TABLET | Freq: Four times a day (QID) | ORAL | 0 refills | Status: AC | PRN

## 2021-12-13 NOTE — Progress Notes (Signed)
Established patient visit  I,April Miller,acting as a scribe for Wilhemena Durie, MD.,have documented all relevant documentation on the behalf of Wilhemena Durie, MD,as directed by  Wilhemena Durie, MD while in the presence of Wilhemena Durie, MD.   Patient: Latoya Cordova   DOB: [birth year]   76 y.o. Female  MRN: [id number] Visit Date: 12/13/2021  Today's healthcare provider: Wilhemena Durie, MD   Chief Complaint  Patient presents with   Follow-up   Hypertension   Subjective    HPI  Patient comes in today for follow-up.  Michela Pitcher today is her son's been in legal trouble this is a first of the year and is now living at home with her and unable to get a job.  She is worried about him and feels that he needs psychiatric help at this time.  She feels safe.  She feels he is suicidal but not homicidal. She states she takes an occasional Vicodin for flareup of recurrent/chronic back pain. Hypertension, follow-up  BP Readings from Last 3 Encounters:  12/13/21 (!) 148/79  09/02/21 (!) 174/81  09/02/20 117/77   Wt Readings from Last 3 Encounters:  12/13/21 217 lb (98.4 kg)  09/02/21 219 lb (99.3 kg)  09/02/20 229 lb (103.9 kg)     She was last seen for hypertension 3 months ago.  Management since that visit includes Advised she needs to schedule a follow up with Dr. Rockey Situ.  Outside blood pressures are not checking.  --------------------------------------------------------------------------------------------------- Follow up for Vitamin D Deficiency:  The patient was last seen for this 3 months ago. Changes made at last visit include; labs checked showing-labs good. Her vitamin D level is extremely  low. If she is already taking vitamin D then she needs to double the dose. If not, then she needs to start taking vitamin D3 2,000 units once a day. -----------------------------------------------------------------------------------------   Medications: Outpatient  Medications Prior to Visit  Medication Sig   buPROPion (WELLBUTRIN XL) 150 MG 24 hr tablet TAKE ONE TABLET BY MOUTH ONE TIME DAILY   butalbital-aspirin-caffeine (FIORINAL) 50-325-40 MG capsule Take 1 capsule by mouth 2 (two) times daily as needed for headache.   Cholecalciferol (VITAMIN D) 50 MCG (2000 UT) CAPS Take by mouth.   furosemide (LASIX) 20 MG tablet TAKE 1 TABLET(20 MG) BY MOUTH EVERY MORNING   hydrochlorothiazide (HYDRODIURIL) 25 MG tablet Take 1 tablet (25 mg total) by mouth daily. PLEASE SCHEDULE OFFICE VISIT FOR FURTHER REFILLS. THANK YOU. FINAL ATTEMPT.   HYDROcodone-acetaminophen (NORCO/VICODIN) 5-325 MG tablet Take 1-2 tablets by mouth every 6 (six) hours as needed for moderate pain.   levothyroxine (SYNTHROID) 25 MCG tablet Take 1 tablet (25 mcg total) by mouth daily.   LORazepam (ATIVAN) 0.5 MG tablet TAKE ONE TABLET BY MOUTH TWICE A DAY AS NEEDED   PARoxetine (PAXIL) 40 MG tablet TAKE ONE TABLET BY MOUTH ONE TIME DAILY **MUST CALL DR. FOR APPOINTMENT FOR FURTHER REFILLS**   rosuvastatin (CRESTOR) 10 MG tablet TAKE ONE TABLET BY MOUTH ONE TIME DAILY   No facility-administered medications prior to visit.    Review of Systems  Constitutional:  Negative for appetite change, chills, fatigue and fever.  Respiratory:  Negative for chest tightness and shortness of breath.   Cardiovascular:  Negative for chest pain and palpitations.  Gastrointestinal:  Negative for abdominal pain, nausea and vomiting.  Neurological:  Negative for dizziness and weakness.    Last hemoglobin A1c Lab Results  Component Value Date  HGBA1C 5.8 (H) 09/02/2021       Objective    BP (!) 148/79 (BP Location: Right Arm, Patient Position: Sitting, Cuff Size: Large)   Pulse 87   Resp 16   Wt 217 lb (98.4 kg)   SpO2 91%   BMI 38.44 kg/m  BP Readings from Last 3 Encounters:  12/14/21 (!) 150/80  12/13/21 (!) 148/79  09/02/21 (!) 174/81   Wt Readings from Last 3 Encounters:  12/14/21 218 lb  (98.9 kg)  12/14/21 218 lb (98.9 kg)  12/13/21 217 lb (98.4 kg)      Physical Exam Vitals reviewed.  Constitutional:      General: She is not in acute distress.    Appearance: She is well-developed.  HENT:     Head: Normocephalic and atraumatic.     Right Ear: Hearing normal.     Left Ear: Hearing normal.     Nose: Nose normal.  Eyes:     General: Lids are normal. No scleral icterus.       Right eye: No discharge.        Left eye: No discharge.     Conjunctiva/sclera: Conjunctivae normal.  Cardiovascular:     Rate and Rhythm: Normal rate and regular rhythm.     Heart sounds: Normal heart sounds.  Pulmonary:     Effort: Pulmonary effort is normal. No respiratory distress.     Breath sounds: Normal breath sounds.  Abdominal:     Palpations: Abdomen is soft.  Skin:    Findings: No lesion or rash.  Neurological:     General: No focal deficit present.     Mental Status: She is alert and oriented to person, place, and time.  Psychiatric:        Mood and Affect: Mood normal.        Speech: Speech normal.        Behavior: Behavior normal.        Thought Content: Thought content normal.        Judgment: Judgment normal.       No results found for any visits on 12/13/21.  Assessment & Plan     1. Osteoarthritis, unspecified osteoarthritis type, unspecified site Refilled earlier this year. - HYDROcodone-acetaminophen (NORCO/VICODIN) 5-325 MG tablet; Take 1-2 tablets by mouth every 6 (six) hours as needed for moderate pain.  Dispense: 50 tablet; Refill: 0  2. Primary hypertension Good control.  Check home blood pressure readings  3. Adult hypothyroidism Treat for euthyroid TSH.  4. Morbid obesity due to excess calories ([place]) Some weight loss would help everything with her health issues  5. Fibromyalgia In addition to chronic fibromyalgia patient has chronic anxiety depression.  Continue paroxetine indefinitely.   No follow-ups on file.      I, Wilhemena Durie, MD, have reviewed all documentation for this visit. The documentation on 12/17/21 for the exam, diagnosis, procedures, and orders are all accurate and complete.    Chanelle Hodsdon Cranford Mon, MD  [facility] [phone] (phone) [phone] (fax)  [place]

## 2021-12-13 NOTE — Patient Instructions (Signed)
CHECK BLOOD PRESSURES AT HOME WEEKLY. CHECK ON RHA FOR SON.

## 2021-12-14 ENCOUNTER — Ambulatory Visit: Payer: PPO | Attending: Physician Assistant | Admitting: Physician Assistant

## 2021-12-14 ENCOUNTER — Other Ambulatory Visit: Payer: Self-pay

## 2021-12-14 ENCOUNTER — Encounter: Payer: Self-pay | Admitting: Physician Assistant

## 2021-12-14 ENCOUNTER — Ambulatory Visit (INDEPENDENT_AMBULATORY_CARE_PROVIDER_SITE_OTHER): Payer: PPO

## 2021-12-14 VITALS — Wt 218.0 lb

## 2021-12-14 VITALS — BP 150/80 | HR 81 | Ht 63.0 in | Wt 218.0 lb

## 2021-12-14 DIAGNOSIS — E785 Hyperlipidemia, unspecified: Secondary | ICD-10-CM

## 2021-12-14 DIAGNOSIS — R5381 Other malaise: Secondary | ICD-10-CM | POA: Diagnosis not present

## 2021-12-14 DIAGNOSIS — I10 Essential (primary) hypertension: Secondary | ICD-10-CM

## 2021-12-14 DIAGNOSIS — Z0000 Encounter for general adult medical examination without abnormal findings: Secondary | ICD-10-CM | POA: Diagnosis not present

## 2021-12-14 DIAGNOSIS — I5032 Chronic diastolic (congestive) heart failure: Secondary | ICD-10-CM | POA: Diagnosis not present

## 2021-12-14 MED ORDER — AMLODIPINE BESYLATE 5 MG PO TABS: 5.0000 mg | ORAL_TABLET | Freq: Every day | ORAL | 3 refills | Status: DC

## 2021-12-14 MED ORDER — HYDROCHLOROTHIAZIDE 25 MG PO TABS: 25.0000 mg | ORAL_TABLET | Freq: Every day | ORAL | 3 refills | Status: DC

## 2021-12-14 NOTE — Patient Instructions (Signed)
Medication Instructions:   START Amlodipine - Take one tablet (5mg ) by mouth daily.   *If you need a refill on your cardiac medications before your next appointment, please call your pharmacy*   Lab Work:  None Ordered  If you have labs (blood work) drawn today and your tests are completely normal, you will receive your results only by: [place] (if you have MyChart) OR A paper copy in the mail If you have any lab test that is abnormal or we need to change your treatment, we will call you to review the results.   Testing/Procedures:  None Ordered   Follow-Up: At [facility], you and your health needs are our priority.  As part of our continuing mission to provide you with exceptional heart care, we have created designated Provider Care Teams.  These Care Teams include your primary Cardiologist (physician) and Advanced Practice Providers (APPs -  Physician Assistants and Nurse Practitioners) who all work together to provide you with the care you need, when you need it.  We recommend signing up for the patient portal called "MyChart".  Sign up information is provided on this After Visit Summary.  MyChart is used to connect with patients for Virtual Visits (Telemedicine).  Patients are able to view lab/test results, encounter notes, upcoming appointments, etc.  Non-urgent messages can be sent to your provider as well.   To learn more about what you can do with MyChart, go to [email or web address].    Your next appointment:   4 month(s)  The format for your next appointment:   In Person  Provider:   You may see Ida Rogue, MD or one of the following Advanced Practice Providers on your designated Care Team:   Murray Hodgkins, NP Christell Faith, PA-C Cadence Kathlen Mody, PA-C Gerrie Nordmann, NP

## 2021-12-14 NOTE — Progress Notes (Signed)
Virtual Visit via Telephone Note  I connected with  Denaya Horn Resetar on 12/14/21 at 11:45 AM EDT by telephone and verified that I am speaking with the correct person using two identifiers.  Location: Patient: HOME Provider: [facility] Persons participating in the virtual visit: patient/Nurse Health Advisor   I discussed the limitations, risks, security and privacy concerns of performing an evaluation and management service by telephone and the availability of in person appointments. The patient expressed understanding and agreed to proceed.  Interactive audio and video telecommunications were attempted between this nurse and patient, however failed, due to patient having technical difficulties OR patient did not have access to video capability.  We continued and completed visit with audio only.  Some vital signs may be absent or patient reported.   Dionisio David, LPN  Subjective:   Marizol Borror Philippi is a 76 y.o. female who presents for Medicare Annual (Subsequent) preventive examination.  Review of Systems     Cardiac Risk Factors include: advanced age (>60men, >48 women);dyslipidemia;hypertension;obesity (BMI >30kg/m2)     Objective:    There were no vitals filed for this visit. There is no height or weight on file to calculate BMI.     12/14/2021   11:47 AM 10/03/2018    9:57 AM 07/09/2017    2:22 PM 04/08/2015    4:56 AM  Advanced Directives  Does Patient Have a Medical Advance Directive? No Yes Yes No  Type of Corporate treasurer of [place];Living will [place];Living will   Copy of [place] in Chart?  No - copy requested No - copy requested   Would patient like information on creating a medical advance directive? No - Patient declined   No - patient declined information    Current Medications (verified) Outpatient Encounter Medications as of 12/14/2021  Medication Sig   amLODipine (NORVASC) 5 MG tablet Take 1 tablet (5 mg  total) by mouth daily.   buPROPion (WELLBUTRIN XL) 150 MG 24 hr tablet TAKE ONE TABLET BY MOUTH ONE TIME DAILY   butalbital-aspirin-caffeine (FIORINAL) 50-325-40 MG capsule Take 1 capsule by mouth 2 (two) times daily as needed for headache.   Cholecalciferol (VITAMIN D) 50 MCG (2000 UT) CAPS Take 1 capsule by mouth daily.   furosemide (LASIX) 20 MG tablet TAKE 1 TABLET(20 MG) BY MOUTH EVERY MORNING   hydrochlorothiazide (HYDRODIURIL) 25 MG tablet Take 1 tablet (25 mg total) by mouth daily. PLEASE SCHEDULE OFFICE VISIT FOR FURTHER REFILLS. THANK YOU. FINAL ATTEMPT.   HYDROcodone-acetaminophen (NORCO/VICODIN) 5-325 MG tablet Take 1-2 tablets by mouth every 6 (six) hours as needed for moderate pain.   levothyroxine (SYNTHROID) 25 MCG tablet Take 1 tablet (25 mcg total) by mouth daily.   LORazepam (ATIVAN) 0.5 MG tablet TAKE ONE TABLET BY MOUTH TWICE A DAY AS NEEDED   PARoxetine (PAXIL) 40 MG tablet TAKE ONE TABLET BY MOUTH ONE TIME DAILY **MUST CALL DR. FOR APPOINTMENT FOR FURTHER REFILLS**   rosuvastatin (CRESTOR) 10 MG tablet TAKE ONE TABLET BY MOUTH ONE TIME DAILY   No facility-administered encounter medications on file as of 12/14/2021.    Allergies (verified) Sulfa antibiotics and Lodine  [etodolac]   History: Past Medical History:  Diagnosis Date   Anxiety    Diastolic dysfunction    a. echo 08/2014: EF 50-55%, mild MR; b. echo 03/2015: EF 60-65%, RWMA could not be excluded, GR1DD, ascending aorta mildly dilated at 68mm, normal right-sided pressure   Hyperlipidemia  Hypertension    Morbid obesity (HCC)    Past Surgical History:  Procedure Laterality Date   ABDOMINAL HYSTERECTOMY  08/18/2004   APPENDECTOMY     Family History  Problem Relation Age of Onset   Ulcers Father    Heart attack Paternal Grandfather    Hypertension Mother    Congestive Heart Failure Mother    Breast cancer Maternal Aunt    Breast cancer Cousin    Social History   Socioeconomic History   Marital  status: Widowed    Spouse name: Not on file   Number of children: 1   Years of education: Not on file   Highest education level: Associate degree: occupational, Scientist, product/process development, or vocational program  Occupational History   Occupation: retired  Tobacco Use   Smoking status: Never   Smokeless tobacco: Never  Vaping Use   Vaping Use: Never used  Substance and Sexual Activity   Alcohol use: No   Drug use: No   Sexual activity: Not on file  Other Topics Concern   Not on file  Social History Narrative   Not on file   Social Determinants of Health   Financial Resource Strain: Low Risk  (12/14/2021)   Overall Financial Resource Strain (CARDIA)    Difficulty of Paying Living Expenses: Not very hard  Food Insecurity: No Food Insecurity (12/14/2021)   Hunger Vital Sign    Worried About Running Out of Food in the Last Year: Never true    Ran Out of Food in the Last Year: Never true  Transportation Needs: No Transportation Needs (12/14/2021)   PRAPARE - Administrator, Civil Service (Medical): No    Lack of Transportation (Non-Medical): No  Physical Activity: Inactive (12/14/2021)   Exercise Vital Sign    Days of Exercise per Week: 0 days    Minutes of Exercise per Session: 0 min  Stress: No Stress Concern Present (12/14/2021)   [employer] of Occupational Health - Occupational Stress Questionnaire    Feeling of Stress : Only a little  Social Connections: Socially Isolated (12/14/2021)   Social Connection and Isolation Panel [NHANES]    Frequency of Communication with Friends and Family: Twice a week    Frequency of Social Gatherings with Friends and Family: More than three times a week    Attends Religious Services: Never    Database administrator or Organizations: No    Attends Banker Meetings: Never    Marital Status: Widowed    Tobacco Counseling Counseling given: Not Answered   Clinical Intake:  Pre-visit preparation completed: Yes  Pain :  No/denies pain     Nutritional Risks: None Diabetes: No  How often do you need to have someone help you when you read instructions, pamphlets, or other written materials from your doctor or pharmacy?: 1 - Never  Diabetic?NO  Interpreter Needed?: No  Information entered by :: Kennedy Bucker, LPN   Activities of Daily Living    12/14/2021   11:48 AM 09/02/2021    9:55 AM  In your present state of health, do you have any difficulty performing the following activities:  Hearing? 0 1  Vision? 0 1  Difficulty concentrating or making decisions? 0 1  Walking or climbing stairs? 1 1  Dressing or bathing? 0 0  Doing errands, shopping? 0 0  Preparing Food and eating ? N   Using the Toilet? N   In the past six months, have you accidently leaked urine? N  Do you have problems with loss of bowel control? N   Managing your Medications? N   Managing your Finances? N   Housekeeping or managing your Housekeeping? N     Patient Care Team: Maple Hudson., MD as PCP - General (Family Medicine) Mariah Milling Tollie Pizza, MD as PCP - Cardiology (Cardiology)  Indicate any recent Medical Services you may have received from other than [facility] providers in the past year (date may be approximate).     Assessment:   This is a routine wellness examination for Belle.  Hearing/Vision screen Hearing Screening - Comments:: NO AIDS Vision Screening - Comments:: WEARS GLASSES- DR.BELL  Dietary issues and exercise activities discussed: Current Exercise Habits: The patient does not participate in regular exercise at present   Goals Addressed             This Visit's Progress    DIET - EAT MORE FRUITS AND VEGETABLES         Depression Screen    12/14/2021   11:45 AM 09/02/2021    9:55 AM 09/02/2020    3:06 PM 04/21/2020    3:06 PM 10/20/2019   12:17 PM 10/03/2018    9:44 AM 07/09/2017    2:22 PM  PHQ 2/9 Scores  PHQ - 2 Score 2 2 2 2 1 1 2   PHQ- 9 Score 6 14 6 5 3  8     Fall Risk     12/14/2021   11:48 AM 09/02/2021    9:55 AM 04/21/2020    3:05 PM 10/20/2019   12:17 PM 02/19/2019    5:02 PM  Fall Risk   Falls in the past year? 0 0 0 0 1  Comment     Emmi Telephone Survey: data to providers prior to load  Number falls in past yr: 0 0 0 0 1  Comment     Emmi Telephone Survey Actual Response = 9  Injury with Fall? 0 0 0 0 0  Risk for fall due to : No Fall Risks No Fall Risks No Fall Risks    Follow up Falls prevention discussed;Falls evaluation completed Falls evaluation completed Falls evaluation completed Falls evaluation completed     FALL RISK PREVENTION PERTAINING TO THE HOME:  Any stairs in or around the home? Yes  If so, are there any without handrails? No  Home free of loose throw rugs in walkways, pet beds, electrical cords, etc? Yes  Adequate lighting in your home to reduce risk of falls? Yes   ASSISTIVE DEVICES UTILIZED TO PREVENT FALLS:  Life alert? No  Use of a cane, walker or w/c? No  Grab bars in the bathroom? Yes  Shower chair or bench in shower? No  Elevated toilet seat or a handicapped toilet? No   Cognitive Function:        12/14/2021   11:49 AM 10/03/2018    9:50 AM  6CIT Screen  What Year? 0 points 0 points  What month? 0 points 0 points  What time? 0 points 0 points  Count back from 20 0 points 0 points  Months in reverse 0 points 0 points  Repeat phrase 0 points 0 points  Total Score 0 points 0 points    Immunizations Immunization History  Administered Date(s) Administered   Fluad Quad(high Dose 65+) 03/06/2019, 04/21/2020   Influenza, High Dose Seasonal PF 01/17/2017, 04/04/2018   Influenza-Unspecified 02/24/2013   Moderna Sars-Covid-2 Vaccination 06/04/2019, 07/02/2019, 03/30/2020   Pneumococcal Conjugate-13 08/08/2013   Pneumococcal  Polysaccharide-23 05/24/2011, 01/20/2017   Tdap 12/13/2006    TDAP status: Due, Education has been provided regarding the importance of this vaccine. Advised may receive this vaccine at local  pharmacy or Health Dept. Aware to provide a copy of the vaccination record if obtained from local pharmacy or Health Dept. Verbalized acceptance and understanding.  Flu Vaccine status: Up to date  Pneumococcal vaccine status: Up to date  Covid-19 vaccine status: Completed vaccines  Qualifies for Shingles Vaccine? Yes   Zostavax completed No   Shingrix Completed?: No.    Education has been provided regarding the importance of this vaccine. Patient has been advised to call insurance company to determine out of pocket expense if they have not yet received this vaccine. Advised may also receive vaccine at local pharmacy or Health Dept. Verbalized acceptance and understanding.  Screening Tests Health Maintenance  Topic Date Due   Hepatitis C Screening  Never done   Zoster Vaccines- Shingrix (1 of 2) Never done   DEXA SCAN  03/02/2014   TETANUS/TDAP  12/12/2016   COVID-19 Vaccine (4 - Moderna series) 05/25/2020   INFLUENZA VACCINE  06/25/2022 (Originally 10/25/2021)   Pneumonia Vaccine 7565+ Years old  Completed   HPV VACCINES  Aged Out   COLONOSCOPY (Pts [id number] Insurance coverage will need to be confirmed)  Discontinued    Health Maintenance  Health Maintenance Due  Topic Date Due   Hepatitis C Screening  Never done   Zoster Vaccines- Shingrix (1 of 2) Never done   DEXA SCAN  03/02/2014   TETANUS/TDAP  12/12/2016   COVID-19 Vaccine (4 - Moderna series) 05/25/2020    Colorectal cancer screening: No longer required.   Mammogram status: No longer required due to AGE.  [facility] declined referrral   Lung Cancer Screening: (Low Dose CT Chest recommended if Age 27-80 years, 30 pack-year currently smoking OR have quit w/in 15years.) does not qualify.   Additional Screening:  Hepatitis C Screening: does qualify; Completed no  Vision Screening: Recommended annual ophthalmology exams for early detection of glaucoma and other disorders of the eye. Is the patient up to date with their  annual eye exam?  Yes  Who is the provider or what is the name of the office in which the patient attends annual eye exams? Dr.Bell If pt is not established with a provider, would they like to be referred to a provider to establish care? No .   Dental Screening: Recommended annual dental exams for proper oral hygiene  Community Resource Referral / Chronic Care Management: CRR required this visit?  No   CCM required this visit?  No      Plan:     I have personally reviewed and noted the following in the patient's chart:   Medical and social history Use of alcohol, tobacco or illicit drugs  Current medications and supplements including opioid prescriptions. Patient is not currently taking opioid prescriptions. Functional ability and status Nutritional status Physical activity Advanced directives List of other physicians Hospitalizations, surgeries, and ER visits in previous 12 months Vitals Screenings to include cognitive, depression, and falls Referrals and appointments  In addition, I have reviewed and discussed with patient certain preventive protocols, quality metrics, and best practice recommendations. A written personalized care plan for preventive services as well as general preventive health recommendations were provided to patient.     Hal HopeLorrie S Keona Sheffler, LPN   1/61/09609/20/2023   Nurse Notes: none

## 2021-12-14 NOTE — Patient Instructions (Signed)
Latoya Cordova , Thank you for taking time to come for your Medicare Wellness Visit. I appreciate your ongoing commitment to your health goals. Please review the following plan we discussed and let me know if I can assist you in the future.   Screening recommendations/referrals: Colonoscopy: aged out Mammogram: aged out Bone Density: declined referral Recommended yearly ophthalmology/optometry visit for glaucoma screening and checkup Recommended yearly dental visit for hygiene and checkup  Vaccinations: Influenza vaccine: 04/21/20 Pneumococcal vaccine: 01/20/17 Tdap vaccine: 12/13/06, due if have injury Shingles vaccine: n/d   Covid-19:06/04/19, 07/02/19, 03/30/20  Advanced directives: no  Conditions/risks identified: none  Next appointment: Follow up in one year for your annual wellness visit 12/18/22 @ 2:45 pm by phone   Preventive Care 58 Years and Older, Female Preventive care refers to lifestyle choices and visits with your health care provider that can promote health and wellness. What does preventive care include? A yearly physical exam. This is also called an annual well check. Dental exams once or twice a year. Routine eye exams. Ask your health care provider how often you should have your eyes checked. Personal lifestyle choices, including: Daily care of your teeth and gums. Regular physical activity. Eating a healthy diet. Avoiding tobacco and drug use. Limiting alcohol use. Practicing safe sex. Taking low-dose aspirin every day. Taking vitamin and mineral supplements as recommended by your health care provider. What happens during an annual well check? The services and screenings done by your health care provider during your annual well check will depend on your age, overall health, lifestyle risk factors, and family history of disease. Counseling  Your health care provider may ask you questions about your: Alcohol use. Tobacco use. Drug use. Emotional well-being. Home  and relationship well-being. Sexual activity. Eating habits. History of falls. Memory and ability to understand (cognition). Work and work Statistician. Reproductive health. Screening  You may have the following tests or measurements: Height, weight, and BMI. Blood pressure. Lipid and cholesterol levels. These may be checked every 5 years, or more frequently if you are over 77 years old. Skin check. Lung cancer screening. You may have this screening every year starting at age 71 if you have a 30-pack-year history of smoking and currently smoke or have quit within the past 15 years. Fecal occult blood test (FOBT) of the stool. You may have this test every year starting at age 16. Flexible sigmoidoscopy or colonoscopy. You may have a sigmoidoscopy every 5 years or a colonoscopy every 10 years starting at age 41. Hepatitis C blood test. Hepatitis B blood test. Sexually transmitted disease (STD) testing. Diabetes screening. This is done by checking your blood sugar (glucose) after you have not eaten for a while (fasting). You may have this done every 1-3 years. Bone density scan. This is done to screen for osteoporosis. You may have this done starting at age 72. Mammogram. This may be done every 1-2 years. Talk to your health care provider about how often you should have regular mammograms. Talk with your health care provider about your test results, treatment options, and if necessary, the need for more tests. Vaccines  Your health care provider may recommend certain vaccines, such as: Influenza vaccine. This is recommended every year. Tetanus, diphtheria, and acellular pertussis (Tdap, Td) vaccine. You may need a Td booster every 10 years. Zoster vaccine. You may need this after age 28. Pneumococcal 13-valent conjugate (PCV13) vaccine. One dose is recommended after age 9. Pneumococcal polysaccharide (PPSV23) vaccine. One dose is recommended after  age 34. Talk to your health care provider  about which screenings and vaccines you need and how often you need them. This information is not intended to replace advice given to you by your health care provider. Make sure you discuss any questions you have with your health care provider. Document Released: 04/09/2015 Document Revised: 12/01/2015 Document Reviewed: 01/12/2015 Elsevier Interactive Patient Education  2017 [place] Prevention in the Home Falls can cause injuries. They can happen to people of all ages. There are many things you can do to make your home safe and to help prevent falls. What can I do on the outside of my home? Regularly fix the edges of walkways and driveways and fix any cracks. Remove anything that might make you trip as you walk through a door, such as a raised step or threshold. Trim any bushes or trees on the path to your home. Use bright outdoor lighting. Clear any walking paths of anything that might make someone trip, such as rocks or tools. Regularly check to see if handrails are loose or broken. Make sure that both sides of any steps have handrails. Any raised decks and porches should have guardrails on the edges. Have any leaves, snow, or ice cleared regularly. Use sand or salt on walking paths during winter. Clean up any spills in your garage right away. This includes oil or grease spills. What can I do in the bathroom? Use night lights. Install grab bars by the toilet and in the tub and shower. Do not use towel bars as grab bars. Use non-skid mats or decals in the tub or shower. If you need to sit down in the shower, use a plastic, non-slip stool. Keep the floor dry. Clean up any water that spills on the floor as soon as it happens. Remove soap buildup in the tub or shower regularly. Attach bath mats securely with double-sided non-slip rug tape. Do not have throw rugs and other things on the floor that can make you trip. What can I do in the bedroom? Use night lights. Make sure  that you have a light by your bed that is easy to reach. Do not use any sheets or blankets that are too big for your bed. They should not hang down onto the floor. Have a firm chair that has side arms. You can use this for support while you get dressed. Do not have throw rugs and other things on the floor that can make you trip. What can I do in the kitchen? Clean up any spills right away. Avoid walking on wet floors. Keep items that you use a lot in easy-to-reach places. If you need to reach something above you, use a strong step stool that has a grab bar. Keep electrical cords out of the way. Do not use floor polish or wax that makes floors slippery. If you must use wax, use non-skid floor wax. Do not have throw rugs and other things on the floor that can make you trip. What can I do with my stairs? Do not leave any items on the stairs. Make sure that there are handrails on both sides of the stairs and use them. Fix handrails that are broken or loose. Make sure that handrails are as long as the stairways. Check any carpeting to make sure that it is firmly attached to the stairs. Fix any carpet that is loose or worn. Avoid having throw rugs at the top or bottom of the stairs. If you do  have throw rugs, attach them to the floor with carpet tape. Make sure that you have a light switch at the top of the stairs and the bottom of the stairs. If you do not have them, ask someone to add them for you. What else can I do to help prevent falls? Wear shoes that: Do not have high heels. Have rubber bottoms. Are comfortable and fit you well. Are closed at the toe. Do not wear sandals. If you use a stepladder: Make sure that it is fully opened. Do not climb a closed stepladder. Make sure that both sides of the stepladder are locked into place. Ask someone to hold it for you, if possible. Clearly mark and make sure that you can see: Any grab bars or handrails. First and last steps. Where the edge of  each step is. Use tools that help you move around (mobility aids) if they are needed. These include: Canes. Walkers. Scooters. Crutches. Turn on the lights when you go into a dark area. Replace any light bulbs as soon as they burn out. Set up your furniture so you have a clear path. Avoid moving your furniture around. If any of your floors are uneven, fix them. If there are any pets around you, be aware of where they are. Review your medicines with your doctor. Some medicines can make you feel dizzy. This can increase your chance of falling. Ask your doctor what other things that you can do to help prevent falls. This information is not intended to replace advice given to you by your health care provider. Make sure you discuss any questions you have with your health care provider. Document Released: 01/07/2009 Document Revised: 08/19/2015 Document Reviewed: 04/17/2014 Elsevier Interactive Patient Education  2017 [employer].

## 2021-12-22 DIAGNOSIS — H40023 Open angle with borderline findings, high risk, bilateral: Secondary | ICD-10-CM | POA: Diagnosis not present

## 2021-12-22 DIAGNOSIS — H2512 Age-related nuclear cataract, left eye: Secondary | ICD-10-CM | POA: Diagnosis not present

## 2021-12-22 DIAGNOSIS — H2513 Age-related nuclear cataract, bilateral: Secondary | ICD-10-CM | POA: Diagnosis not present

## 2021-12-22 DIAGNOSIS — H25043 Posterior subcapsular polar age-related cataract, bilateral: Secondary | ICD-10-CM | POA: Diagnosis not present

## 2021-12-22 DIAGNOSIS — I10 Essential (primary) hypertension: Secondary | ICD-10-CM | POA: Diagnosis not present

## 2021-12-24 ENCOUNTER — Other Ambulatory Visit: Payer: Self-pay | Admitting: Family Medicine

## 2021-12-24 DIAGNOSIS — F411 Generalized anxiety disorder: Secondary | ICD-10-CM

## 2021-12-26 NOTE — Telephone Encounter (Signed)
Requested medication (s) are due for refill today: yes  Requested medication (s) are on the active medication list: yes  Last refill:  09/28/21 #30 with 1 RF  Future visit scheduled: no, just seen 12/13/21  Notes to clinic:  This medication can not be delegated, please assess.        Requested Prescriptions  Pending Prescriptions Disp Refills   LORazepam (ATIVAN) 0.5 MG tablet [Pharmacy Med Name: LORAZEPAM 0.5 MG TAB] 30 tablet 1    Sig: TAKE ONE TABLET BY MOUTH TWICE A DAY AS NEEDED     Not Delegated - Psychiatry: Anxiolytics/Hypnotics 2 Failed - 12/24/2021  2:31 AM      Failed - This refill cannot be delegated      Failed - Urine Drug Screen completed in last 360 days      Passed - Patient is not pregnant      Passed - Valid encounter within last 6 months    Recent Outpatient Visits           1 week ago Primary hypertension   [facility] Jerrol Banana., MD   3 months ago Chronic nonintractable headache, unspecified headache type   [facility] Birdie Sons, MD   1 year ago Primary hypertension   [facility] Jerrol Banana., MD   1 year ago Primary hypertension   [facility] Jerrol Banana., MD   2 years ago Annual physical exam   [facility] Jerrol Banana., MD       Future Appointments             In 3 months Dunn, Areta Haber, PA-C [place]. [place]

## 2022-01-05 DIAGNOSIS — H401234 Low-tension glaucoma, bilateral, indeterminate stage: Secondary | ICD-10-CM | POA: Diagnosis not present

## 2022-01-05 DIAGNOSIS — H40023 Open angle with borderline findings, high risk, bilateral: Secondary | ICD-10-CM | POA: Diagnosis not present

## 2022-01-20 DIAGNOSIS — H47013 Ischemic optic neuropathy, bilateral: Secondary | ICD-10-CM | POA: Diagnosis not present

## 2022-01-20 DIAGNOSIS — G9389 Other specified disorders of brain: Secondary | ICD-10-CM | POA: Diagnosis not present

## 2022-02-22 ENCOUNTER — Telehealth: Payer: Self-pay | Admitting: Family Medicine

## 2022-02-22 DIAGNOSIS — F3289 Other specified depressive episodes: Secondary | ICD-10-CM

## 2022-02-22 DIAGNOSIS — E038 Other specified hypothyroidism: Secondary | ICD-10-CM

## 2022-02-22 MED ORDER — LEVOTHYROXINE SODIUM 25 MCG PO TABS: 25.0000 ug | ORAL_TABLET | Freq: Every day | ORAL | 1 refills | Status: AC

## 2022-02-22 MED ORDER — BUPROPION HCL ER (XL) 150 MG PO TB24: ORAL_TABLET | ORAL | 1 refills | Status: DC

## 2022-02-22 NOTE — Telephone Encounter (Signed)
[facility] faxed refill request for the following medications:   levothyroxine (SYNTHROID) 25 MCG tablet     buPROPion (WELLBUTRIN XL) 150 MG 24 hr tablet   Please advise

## 2022-03-02 ENCOUNTER — Telehealth: Payer: Self-pay | Admitting: Family Medicine

## 2022-03-02 NOTE — Telephone Encounter (Signed)
Which medication ?

## 2022-03-08 DIAGNOSIS — H2512 Age-related nuclear cataract, left eye: Secondary | ICD-10-CM | POA: Diagnosis not present

## 2022-03-09 DIAGNOSIS — H2511 Age-related nuclear cataract, right eye: Secondary | ICD-10-CM | POA: Diagnosis not present

## 2022-03-22 DIAGNOSIS — H2511 Age-related nuclear cataract, right eye: Secondary | ICD-10-CM | POA: Diagnosis not present

## 2022-04-15 NOTE — Progress Notes (Signed)
Cardiology Office Note    Date:  04/17/2022   ID:  Latoya, Cordova 06-13-1945, MRN [id number]  PCP:  Maple Hudson., MD  Cardiologist:  Julien Nordmann, MD  Electrophysiologist:  None   Chief Complaint: Follow-up  History of Present Illness:   Latoya Cordova is a 77 y.o. female with history of HFpEF, HTN, HLD, obesity, and anxiety who presents for follow up of her HFpEF.    Prior echo from 08/2014 showed an EF of 50-55% and mild mitral regurgitation. She was admitted to the hospital in 03/2015 with acute respiratory distress felt to be secondary to viral pneumonitis vs PNA. Echo at that time showed an EF of 60-65%, mild focal and moderate concentric LVH, LVOT gradient of 41 mmHg with Valsalva, Gr1DD, mildly dilated ascending aorta measuring 37 mm, normal RVSF and PASP. CTA chest was negative for PE, unremarkable thoracic aorta, and there were no significant coronary artery calcifications or aortic atherosclerosis noted. She was treated with Levaquin with symptom improvement. She was seen virtually in 10/2018, noting chronic SOB, "due to weight," and was taking Lasix every other day. There was high fluid intake and it was noted she would eat when bored. No changes were made at that time.  She did have a mechanical fall on 01/20/2020, while squatting down to put flowers on her husband's grave and scraped her knee. No LOC and she did not hit her head.  She was seen in the office on 01/28/2020 and was doing well from a cardiac perspective.  She noted a 1 to 2-year history of progressive exertional dyspnea.  She did feel like her sedentary lifestyle was contributing to this.  Echo on 02/11/2020 showed an EF of 55 to 60%, no regional wall motion abnormalities, grade 1 diastolic dysfunction, normal RV systolic function, estimated right atrial pressure of 3 mmHg, and no significant valvular abnormalities.  She was seen in the office on 02/24/2020 and was doing well from a cardiac perspective.  She  continued to note a stable 1 to 2-year history of progressive exertional dyspnea without chest pain.  In this setting she underwent Lexiscan MPI on 03/01/2020 which showed no significant ischemia or scar and was overall low risk.  CT attenuation corrected images showed evidence of aortic atherosclerosis with minimal coronary artery calcification.  She was last seen in the office in 08/2021 and was without symptoms of angina or decompensation with chronic stable dyspnea, though was under increased stress with noted elevated BP readings.  Amlodipine 5 mg daily was added.  She comes in doing well from a cardiac perspective and is without symptoms of angina or decompensation.  She continues to note chronic stable dyspnea that has been unchanged over the past several years.  No significant lower extremity swelling or progressive orthopnea.  She continues to be under increased stress at home surrounding her son, though has noted well-controlled blood pressures following the addition of amlodipine.  In the setting of these stressors, she does feel safe at home.  No dizziness, presyncope, or syncope.  She would like to reestablish with pulmonology, previously followed by Dr. Sung Amabile.  She also notes a longstanding history of daily headaches and is hopeful a new eyeglasses prescription will help with this.  She reports having recently undergone an MRI of the brain which was unrevealing per her report.   Labs independently reviewed: 08/2021 - A1c 5.8, TSH normal, TC 141, TG 99, HDL 52, LDL 71, BUN 12, serum creatinine 0.86, potassium  4.5, albumin 4.6, AST 41, ALT normal, Hgb 15.6, PLT 180   Past Medical History:  Diagnosis Date   Anxiety    Diastolic dysfunction    a. echo 08/2014: EF 50-55%, mild MR; b. echo 03/2015: EF 60-65%, RWMA could not be excluded, GR1DD, ascending aorta mildly dilated at 41mm, normal right-sided pressure   Hyperlipidemia    Hypertension    Morbid obesity ([place])     Past Surgical History:   Procedure Laterality Date   ABDOMINAL HYSTERECTOMY  08/18/2004   APPENDECTOMY     CATARACT EXTRACTION Bilateral     Current Medications: Current Meds  Medication Sig   amLODipine (NORVASC) 5 MG tablet Take 1 tablet (5 mg total) by mouth daily.   brimonidine (ALPHAGAN) 0.2 % ophthalmic solution 1 drop.   buPROPion (WELLBUTRIN XL) 150 MG 24 hr tablet TAKE ONE TABLET BY MOUTH ONE TIME DAILY   butalbital-aspirin-caffeine (FIORINAL) 50-325-40 MG capsule Take 1 capsule by mouth 2 (two) times daily as needed for headache.   Cholecalciferol (VITAMIN D) 50 MCG (2000 UT) CAPS Take 1 capsule by mouth daily.   furosemide (LASIX) 20 MG tablet TAKE 1 TABLET(20 MG) BY MOUTH EVERY MORNING   hydrochlorothiazide (HYDRODIURIL) 25 MG tablet Take 1 tablet (25 mg total) by mouth daily.   HYDROcodone-acetaminophen (NORCO/VICODIN) 5-325 MG tablet Take 1-2 tablets by mouth every 6 (six) hours as needed for moderate pain.   ketorolac (ACULAR) 0.5 % ophthalmic solution 1 drop.   levothyroxine (SYNTHROID) 25 MCG tablet Take 1 tablet (25 mcg total) by mouth daily.   LORazepam (ATIVAN) 0.5 MG tablet TAKE ONE TABLET BY MOUTH TWICE A DAY AS NEEDED   moxifloxacin (VIGAMOX) 0.5 % ophthalmic solution Apply to eye.   prednisoLONE acetate (PRED FORTE) 1 % ophthalmic suspension    rosuvastatin (CRESTOR) 10 MG tablet TAKE ONE TABLET BY MOUTH ONE TIME DAILY   [DISCONTINUED] PARoxetine (PAXIL) 40 MG tablet TAKE ONE TABLET BY MOUTH ONE TIME DAILY **MUST CALL DR. FOR APPOINTMENT FOR FURTHER REFILLS**    Allergies:   Sulfa antibiotics and Lodine  [etodolac]   Social History   Socioeconomic History   Marital status: Widowed    Spouse name: Not on file   Number of children: 1   Years of education: Not on file   Highest education level: Associate degree: occupational, Hotel manager, or vocational program  Occupational History   Occupation: retired  Tobacco Use   Smoking status: Never   Smokeless tobacco: Never  Vaping Use    Vaping Use: Never used  Substance and Sexual Activity   Alcohol use: No   Drug use: No   Sexual activity: Not on file  Other Topics Concern   Not on file  Social History Narrative   Not on file   Social Determinants of Health   Financial Resource Strain: Low Risk  (12/14/2021)   Overall Financial Resource Strain (CARDIA)    Difficulty of Paying Living Expenses: Not very hard  Food Insecurity: No Food Insecurity (12/14/2021)   Hunger Vital Sign    Worried About Running Out of Food in the Last Year: Never true    Ran Out of Food in the Last Year: Never true  Transportation Needs: No Transportation Needs (12/14/2021)   PRAPARE - Hydrologist (Medical): No    Lack of Transportation (Non-Medical): No  Physical Activity: Inactive (12/14/2021)   Exercise Vital Sign    Days of Exercise per Week: 0 days    Minutes of  Exercise per Session: 0 min  Stress: No Stress Concern Present (12/14/2021)   [place]    Feeling of Stress : Only a little  Social Connections: Socially Isolated (12/14/2021)   Social Connection and Isolation Panel [NHANES]    Frequency of Communication with Friends and Family: Twice a week    Frequency of Social Gatherings with Friends and Family: More than three times a week    Attends Religious Services: Never    Marine scientist or Organizations: No    Attends Archivist Meetings: Never    Marital Status: Widowed     Family History:  The patient's family history includes Breast cancer in her cousin and maternal aunt; Congestive Heart Failure in her mother; Heart attack in her paternal grandfather; Hypertension in her mother; Ulcers in her father.  ROS:   12-point review of systems is negative unless otherwise noted in the HPI.   EKGs/Labs/Other Studies Reviewed:    Studies reviewed were summarized above. The additional studies were reviewed  today:  2D echo 08/2014: - Left ventricle: The cavity size was normal. Systolic function was    normal. The estimated ejection fraction was in the range of 50%    to 55%.  - Mitral valve: There was mild regurgitation. __________   2D echo 03/2015: - Left ventricle: The cavity size was normal. There was mild focal    basal and moderate concentric hypertrophy of the septum. There is    LVOT gradient of 41 mm Hg with valsalva (not well visualized)    Systolic function was normal. The estimated ejection fraction was    in the range of 60% to 65%. Regional wall motion abnormalities    cannot be excluded. Doppler parameters are consistent with    abnormal left ventricular relaxation (grade 1 diastolic    dysfunction).  - Aorta: Ascending aorta is mildly dilated, diameter: 37 mm (S).  - Left atrium: The atrium was normal in size.  - Right ventricle: Systolic function was normal.  - Pulmonary arteries: Systolic pressure was within the normal    range.   Impressions:   - Challenging image quality. __________   2D echo 02/11/2020: 1. Left ventricular ejection fraction, by estimation, is 55 to 60%. The  left ventricle has normal function. The left ventricle has no regional  wall motion abnormalities. Left ventricular diastolic parameters are  consistent with Grade I diastolic  dysfunction (impaired relaxation).   2. Right ventricular systolic function is normal. The right ventricular  size is not well visualized.   3. The mitral valve is grossly normal. No evidence of mitral valve  regurgitation.   4. The aortic valve was not well visualized. Aortic valve regurgitation  not assessed.   5. The inferior vena cava is normal in size with greater than 50%  respiratory variability, suggesting right atrial pressure of 3 mmHg. __________   Carlton Adam MPI 03/01/2020: There was no ST segment deviation noted during stress. No T wave inversion was noted during stress. The study is normal. This  is a low risk study. The left ventricular ejection fraction is hyperdynamic (>65%). CT attenuation images show evidence of aortic calcifications but minimal coronary calcifications.   EKG:  EKG is not ordered today.    Recent Labs: 09/02/2021: ALT 21; BUN 12; Creatinine, Ser 0.86; Hemoglobin 15.6; Platelets 180; Potassium 4.5; Sodium 143; TSH 3.730  Recent Lipid Panel    Component Value Date/Time   CHOL 141  09/02/2021 1100   TRIG 99 09/02/2021 1100   HDL 52 09/02/2021 1100   CHOLHDL 2.7 09/02/2021 1100   CHOLHDL 3.8 02/19/2017 1445   LDLCALC 71 09/02/2021 1100   LDLCALC 109 (H) 02/19/2017 1445   LDLDIRECT 104 (H) 04/22/2020 1411    PHYSICAL EXAM:    VS:  BP 118/70 (BP Location: Left Arm, Patient Position: Sitting, Cuff Size: Large)   Pulse 88   Ht 5\' 3"  (1.6 m)   Wt 214 lb 4 oz (97.2 kg)   SpO2 95%   BMI 37.95 kg/m   BMI: Body mass index is 37.95 kg/m.  Physical Exam Vitals reviewed.  Constitutional:      Appearance: She is well-developed.  HENT:     Head: Normocephalic and atraumatic.  Eyes:     General:        Right eye: No discharge.        Left eye: No discharge.  Neck:     Vascular: No JVD.  Cardiovascular:     Rate and Rhythm: Normal rate and regular rhythm.     Heart sounds: Normal heart sounds, S1 normal and S2 normal. Heart sounds not distant. No midsystolic click and no opening snap. No murmur heard.    No friction rub.  Pulmonary:     Effort: Pulmonary effort is normal. No respiratory distress.     Breath sounds: Normal breath sounds. No decreased breath sounds, wheezing or rales.  Chest:     Chest wall: No tenderness.  Abdominal:     General: There is no distension.  Musculoskeletal:     Cervical back: Normal range of motion.  Skin:    General: Skin is warm and dry.     Nails: There is no clubbing.  Neurological:     Mental Status: She is alert and oriented to person, place, and time.  Psychiatric:        Speech: Speech normal.         Behavior: Behavior normal.        Thought Content: Thought content normal.        Judgment: Judgment normal.     Wt Readings from Last 3 Encounters:  04/17/22 214 lb 4 oz (97.2 kg)  12/14/21 218 lb (98.9 kg)  12/14/21 218 lb (98.9 kg)     ASSESSMENT & PLAN:   HFpEF: She is euvolemic and well compensated and remains on low-dose furosemide.  Could consider addition of SGLT2 inhibitor or MRA once home stressors have improved in an effort to minimize healthcare burden with follow-up venipunctures.  HTN: Blood pressure well-controlled in the office today.  She remains on amlodipine and HCTZ.  HLD: LDL 71 in 08/2021 with normal ALT.she remains on rosuvastatin.  Obesity and physical deconditioning: Her weight is down another 4 pounds today.  Continued weight loss is encouraged through healthy diet and regular exercise.  Suspect obesity and physical deconditioning are contributing to her longstanding shortness of breath.  Chronic dyspnea: Reassuring cardiac workup over the years with stable symptoms.  Suspect obesity and physical deconditioning are contributing.  However, she would like to reestablish with pulmonology.  Referral was placed at her request.  Depression: Refill of Paxil was authorized.  Ongoing management per PCP.     Disposition: F/u with Dr. Rockey Situ or an APP in 6 months.   Medication Adjustments/Labs and Tests Ordered: Current medicines are reviewed at length with the patient today.  Concerns regarding medicines are outlined above. Medication changes, Labs and Tests ordered today  are summarized above and listed in the Patient Instructions accessible in Encounters.   Signed, Christell Faith, PA-C 04/17/2022 5:02 PM     [facility] [address] [place] [address] [place], [place] [address] [phone]

## 2022-04-17 ENCOUNTER — Ambulatory Visit: Payer: PPO | Attending: Physician Assistant | Admitting: Physician Assistant

## 2022-04-17 ENCOUNTER — Encounter: Payer: Self-pay | Admitting: Physician Assistant

## 2022-04-17 VITALS — BP 118/70 | HR 88 | Ht 63.0 in | Wt 214.2 lb

## 2022-04-17 DIAGNOSIS — E785 Hyperlipidemia, unspecified: Secondary | ICD-10-CM

## 2022-04-17 DIAGNOSIS — R0609 Other forms of dyspnea: Secondary | ICD-10-CM | POA: Diagnosis not present

## 2022-04-17 DIAGNOSIS — E66812 Obesity, class 2: Secondary | ICD-10-CM

## 2022-04-17 DIAGNOSIS — E669 Obesity, unspecified: Secondary | ICD-10-CM

## 2022-04-17 DIAGNOSIS — F3289 Other specified depressive episodes: Secondary | ICD-10-CM

## 2022-04-17 DIAGNOSIS — R5381 Other malaise: Secondary | ICD-10-CM

## 2022-04-17 DIAGNOSIS — I5032 Chronic diastolic (congestive) heart failure: Secondary | ICD-10-CM | POA: Diagnosis not present

## 2022-04-17 DIAGNOSIS — I10 Essential (primary) hypertension: Secondary | ICD-10-CM

## 2022-04-17 DIAGNOSIS — Z6837 Body mass index (BMI) 37.0-37.9, adult: Secondary | ICD-10-CM | POA: Diagnosis not present

## 2022-04-17 MED ORDER — PAROXETINE HCL 40 MG PO TABS: ORAL_TABLET | ORAL | 0 refills | Status: AC

## 2022-04-17 NOTE — Patient Instructions (Signed)
Referral placed for Pulmonary and their number is [phone]   Medication Instructions:  No changes at this time.   *If you need a refill on your cardiac medications before your next appointment, please call your pharmacy*   Lab Work: None  If you have labs (blood work) drawn today and your tests are completely normal, you will receive your results only by: [employer] (if you have MyChart) OR A paper copy in the mail If you have any lab test that is abnormal or we need to change your treatment, we will call you to review the results.   Testing/Procedures: None   Follow-Up: At [facility], you and your health needs are our priority.  As part of our continuing mission to provide you with exceptional heart care, we have created designated Provider Care Teams.  These Care Teams include your primary Cardiologist (physician) and Advanced Practice Providers (APPs -  Physician Assistants and Nurse Practitioners) who all work together to provide you with the care you need, when you need it.    Your next appointment:   6 month(s)  Provider:   Ida Rogue, MD or Christell Faith, PA-C

## 2022-04-24 ENCOUNTER — Institutional Professional Consult (permissible substitution): Payer: PPO | Admitting: Student in an Organized Health Care Education/Training Program

## 2022-05-04 ENCOUNTER — Encounter: Payer: Self-pay | Admitting: Student in an Organized Health Care Education/Training Program

## 2022-05-04 ENCOUNTER — Ambulatory Visit (INDEPENDENT_AMBULATORY_CARE_PROVIDER_SITE_OTHER): Payer: PPO | Admitting: Student in an Organized Health Care Education/Training Program

## 2022-05-04 VITALS — BP 124/80 | HR 100 | Temp 97.7°F | Ht 63.0 in | Wt 213.4 lb

## 2022-05-04 DIAGNOSIS — R0602 Shortness of breath: Secondary | ICD-10-CM | POA: Diagnosis not present

## 2022-05-04 NOTE — Progress Notes (Signed)
Synopsis: Referred in for shortness of breath by Rise Mu, PA-C  Assessment & Plan:   1. Shortness of breath  Pleasant 77 year old female presenting for the evaluation of shortness of breath. She has no history of smoking or personal history of asthma, and her lung exam is re-assuring and benign. I will initiate a workup with pulmonary function testing (spirometry, lung volumes, DLCO) to assess for any abnormalities that could contribute to her symptoms. I also suspect a combination of central obesity and HFpEF to be contributing to her dyspnea as well. At the moment, I will hold off on initiating any therapies pending the PFT's. Patient was counseled on the importance of weight loss.  - Pulmonary Function Test [facility] Only; Future   Return in about 2 months (around 07/03/2022).  I spent 45 minutes caring for this patient today, including preparing to see the patient, obtaining a medical history , reviewing a separately obtained history, performing a medically appropriate examination and/or evaluation, counseling and educating the patient/family/caregiver, ordering medications, tests, or procedures, documenting clinical information in the electronic health record, and independently interpreting results (not separately reported/billed) and communicating results to the patient/family/caregiver  Armando Reichert, MD [facility] 05/04/2022 6:00 PM    End of visit medications:  No orders of the defined types were placed in this encounter.    Current Outpatient Medications:    amLODipine (NORVASC) 5 MG tablet, Take 1 tablet (5 mg total) by mouth daily., Disp: 90 tablet, Rfl: 3   brimonidine (ALPHAGAN) 0.2 % ophthalmic solution, 1 drop., Disp: , Rfl:    buPROPion (WELLBUTRIN XL) 150 MG 24 hr tablet, TAKE ONE TABLET BY MOUTH ONE TIME DAILY, Disp: 90 tablet, Rfl: 1   butalbital-aspirin-caffeine (FIORINAL) 50-325-40 MG capsule, Take 1 capsule by mouth 2 (two) times daily as  needed for headache., Disp: 50 capsule, Rfl: 2   Cholecalciferol (VITAMIN D) 50 MCG (2000 UT) CAPS, Take 1 capsule by mouth daily., Disp: , Rfl:    furosemide (LASIX) 20 MG tablet, TAKE 1 TABLET(20 MG) BY MOUTH EVERY MORNING, Disp: 30 tablet, Rfl: 11   hydrochlorothiazide (HYDRODIURIL) 25 MG tablet, Take 1 tablet (25 mg total) by mouth daily., Disp: 90 tablet, Rfl: 3   HYDROcodone-acetaminophen (NORCO/VICODIN) 5-325 MG tablet, Take 1-2 tablets by mouth every 6 (six) hours as needed for moderate pain., Disp: 50 tablet, Rfl: 0   ketorolac (ACULAR) 0.5 % ophthalmic solution, 1 drop., Disp: , Rfl:    levothyroxine (SYNTHROID) 25 MCG tablet, Take 1 tablet (25 mcg total) by mouth daily., Disp: 90 tablet, Rfl: 1   LORazepam (ATIVAN) 0.5 MG tablet, TAKE ONE TABLET BY MOUTH TWICE A DAY AS NEEDED, Disp: 30 tablet, Rfl: 3   moxifloxacin (VIGAMOX) 0.5 % ophthalmic solution, Apply to eye., Disp: , Rfl:    PARoxetine (PAXIL) 40 MG tablet, TAKE ONE TABLET BY MOUTH ONE TIME DAILY **MUST CALL DR. FOR APPOINTMENT FOR FURTHER REFILLS**, Disp: 30 tablet, Rfl: 0   prednisoLONE acetate (PRED FORTE) 1 % ophthalmic suspension, , Disp: , Rfl:    rosuvastatin (CRESTOR) 10 MG tablet, TAKE ONE TABLET BY MOUTH ONE TIME DAILY, Disp: 30 tablet, Rfl: 11   Subjective:   PATIENT ID: Latoya Cordova GENDER: female DOB: [birth year], MRN: [id number]  Chief Complaint  Patient presents with   pulmonary consult    SOB with exertion, dry cough and wheezing.     HPI  Latoya Cordova is a pleasant 77 year old female with a past  medical history of HFpEF, HTN, and Obesity who presents to clinic for the evaluation of shortness of breath.  She reports exertional dyspnea for many years, and her symptoms appear to have worsened. She feels very short of breath walking from one room to another in her house. She also feels extreme dyspnea when bending over. She has no chest pain, chest tightness, wheezing, cough, sputum production, hemoptysis,  fevers, chills, or night sweats. She has been gaining weight, and has gain significant weight over the past 5 years. She feels her symptoms did worsen as she gained weight. She has no history of any lung disease, and was very healthy growing up.  She does follow up with cardiology closely for HFpEF and HTN. She has had multiple TTE's as well as a stress test, and is currently maintained on furosemide, HCTZ, and amlodipine. She reports lower extremity edema that is stable. She was seen by pulmonary in 2017 once for the evaluation of chest CT abnormalities (mosaic ground glass opacities) following a viral illness.  She used to work for a bank, and is a life long non-smoker. She lived most of her life in [place], aside from a few years in [place]. No occupational exposures reported. She lives at home with her son.  Ancillary information including prior medications, full medical/surgical/family/social histories, and PFTs (when available) are listed below and have been reviewed.   Review of Systems  Constitutional:  Negative for chills, fever, malaise/fatigue and weight loss.  Respiratory:  Positive for shortness of breath. Negative for cough, hemoptysis, sputum production and wheezing.   Cardiovascular:  Negative for chest pain.     Objective:   Vitals:   05/04/22 1509  BP: 124/80  Pulse: 100  Temp: 97.7 F (36.5 C)  TempSrc: Temporal  SpO2: 91%  Weight: 213 lb 6.4 oz (96.8 kg)  Height: 5\' 3"  (1.6 m)   91% on RA  BMI Readings from Last 3 Encounters:  05/04/22 37.80 kg/m  04/17/22 37.95 kg/m  12/14/21 38.62 kg/m   Wt Readings from Last 3 Encounters:  05/04/22 213 lb 6.4 oz (96.8 kg)  04/17/22 214 lb 4 oz (97.2 kg)  12/14/21 218 lb (98.9 kg)    Physical Exam Constitutional:      Appearance: Normal appearance. She is obese. She is not ill-appearing.  Cardiovascular:     Rate and Rhythm: Normal rate and regular rhythm.     Pulses: Normal pulses.     Heart sounds: Normal heart  sounds.  Pulmonary:     Effort: Pulmonary effort is normal. No respiratory distress.     Breath sounds: Normal breath sounds. No wheezing, rhonchi or rales.  Abdominal:     General: There is distension.     Palpations: Abdomen is soft.  Neurological:     General: No focal deficit present.     Mental Status: She is alert and oriented to person, place, and time. Mental status is at baseline.      Ancillary Information    Past Medical History:  Diagnosis Date   Anxiety    Diastolic dysfunction    a. echo 08/2014: EF 50-55%, mild MR; b. echo 03/2015: EF 60-65%, RWMA could not be excluded, GR1DD, ascending aorta mildly dilated at 57mm, normal right-sided pressure   Hyperlipidemia    Hypertension    Morbid obesity ([place])      Family History  Problem Relation Age of Onset   Ulcers Father    Heart attack Paternal Grandfather    Hypertension Mother  Congestive Heart Failure Mother    Breast cancer Maternal Aunt    Breast cancer Cousin      Past Surgical History:  Procedure Laterality Date   ABDOMINAL HYSTERECTOMY  08/18/2004   APPENDECTOMY     CATARACT EXTRACTION Bilateral     Social History   Socioeconomic History   Marital status: Widowed    Spouse name: Not on file   Number of children: 1   Years of education: Not on file   Highest education level: Associate degree: occupational, Hotel manager, or vocational program  Occupational History   Occupation: retired  Tobacco Use   Smoking status: Never   Smokeless tobacco: Never  Vaping Use   Vaping Use: Never used  Substance and Sexual Activity   Alcohol use: No   Drug use: No   Sexual activity: Not on file  Other Topics Concern   Not on file  Social History Narrative   Not on file   Social Determinants of Health   Financial Resource Strain: Low Risk  (12/14/2021)   Overall Financial Resource Strain (CARDIA)    Difficulty of Paying Living Expenses: Not very hard  Food Insecurity: No Food Insecurity (12/14/2021)    Hunger Vital Sign    Worried About Running Out of Food in the Last Year: Never true    Ran Out of Food in the Last Year: Never true  Transportation Needs: No Transportation Needs (12/14/2021)   PRAPARE - Hydrologist (Medical): No    Lack of Transportation (Non-Medical): No  Physical Activity: Inactive (12/14/2021)   Exercise Vital Sign    Days of Exercise per Week: 0 days    Minutes of Exercise per Session: 0 min  Stress: No Stress Concern Present (12/14/2021)   [place]    Feeling of Stress : Only a little  Social Connections: Socially Isolated (12/14/2021)   Social Connection and Isolation Panel [NHANES]    Frequency of Communication with Friends and Family: Twice a week    Frequency of Social Gatherings with Friends and Family: More than three times a week    Attends Religious Services: Never    Marine scientist or Organizations: No    Attends Archivist Meetings: Never    Marital Status: Widowed  Intimate Partner Violence: Not At Risk (12/14/2021)   Humiliation, Afraid, Rape, and Kick questionnaire    Fear of Current or Ex-Partner: No    Emotionally Abused: No    Physically Abused: No    Sexually Abused: No     Allergies  Allergen Reactions   Sulfa Antibiotics     Per Mother   Lodine  [Etodolac] Rash    Abdominal rash.     CBC    Component Value Date/Time   WBC 8.7 09/02/2021 1100   WBC 8.6 02/19/2017 1445   RBC 5.06 09/02/2021 1100   RBC 4.85 02/19/2017 1445   HGB 15.6 09/02/2021 1100   HCT 46.5 09/02/2021 1100   PLT 180 09/02/2021 1100   MCV 92 09/02/2021 1100   MCH 30.8 09/02/2021 1100   MCH 30.7 02/19/2017 1445   MCHC 33.5 09/02/2021 1100   MCHC 34.7 02/19/2017 1445   RDW 13.0 09/02/2021 1100   LYMPHSABS 3.2 (H) 03/13/2019 1206   EOSABS 0.2 03/13/2019 1206   BASOSABS 0.1 03/13/2019 1206    Pulmonary Functions Testing Results:     No data  to display  Outpatient Medications Prior to Visit  Medication Sig Dispense Refill   amLODipine (NORVASC) 5 MG tablet Take 1 tablet (5 mg total) by mouth daily. 90 tablet 3   brimonidine (ALPHAGAN) 0.2 % ophthalmic solution 1 drop.     buPROPion (WELLBUTRIN XL) 150 MG 24 hr tablet TAKE ONE TABLET BY MOUTH ONE TIME DAILY 90 tablet 1   butalbital-aspirin-caffeine (FIORINAL) 50-325-40 MG capsule Take 1 capsule by mouth 2 (two) times daily as needed for headache. 50 capsule 2   Cholecalciferol (VITAMIN D) 50 MCG (2000 UT) CAPS Take 1 capsule by mouth daily.     furosemide (LASIX) 20 MG tablet TAKE 1 TABLET(20 MG) BY MOUTH EVERY MORNING 30 tablet 11   hydrochlorothiazide (HYDRODIURIL) 25 MG tablet Take 1 tablet (25 mg total) by mouth daily. 90 tablet 3   HYDROcodone-acetaminophen (NORCO/VICODIN) 5-325 MG tablet Take 1-2 tablets by mouth every 6 (six) hours as needed for moderate pain. 50 tablet 0   ketorolac (ACULAR) 0.5 % ophthalmic solution 1 drop.     levothyroxine (SYNTHROID) 25 MCG tablet Take 1 tablet (25 mcg total) by mouth daily. 90 tablet 1   LORazepam (ATIVAN) 0.5 MG tablet TAKE ONE TABLET BY MOUTH TWICE A DAY AS NEEDED 30 tablet 3   moxifloxacin (VIGAMOX) 0.5 % ophthalmic solution Apply to eye.     PARoxetine (PAXIL) 40 MG tablet TAKE ONE TABLET BY MOUTH ONE TIME DAILY **MUST CALL DR. FOR APPOINTMENT FOR FURTHER REFILLS** 30 tablet 0   prednisoLONE acetate (PRED FORTE) 1 % ophthalmic suspension      rosuvastatin (CRESTOR) 10 MG tablet TAKE ONE TABLET BY MOUTH ONE TIME DAILY 30 tablet 11   No facility-administered medications prior to visit.

## 2022-06-15 DIAGNOSIS — H40153 Residual stage of open-angle glaucoma, bilateral: Secondary | ICD-10-CM | POA: Diagnosis not present

## 2022-07-03 ENCOUNTER — Telehealth: Payer: Self-pay

## 2022-07-03 NOTE — Telephone Encounter (Signed)
Pt called the office to cancel her appt which was scheduled 4/9 as the PFT was not scheduled. I have cancelled pt's OV until after she is able to get the PFT scheduled prior.  Routing to [place] triage.

## 2022-07-03 NOTE — Telephone Encounter (Signed)
Synetta Fail, please schedule PFT. Thank you.

## 2022-07-03 NOTE — Telephone Encounter (Signed)
ROV scheduled 07/04/2022. PFT ordered at last OV, however it was not completed. ATC patient to ask if she wanted to reschedule visit until PFT had been completed. Unable to leave vm due to mailbox not being setup.  Will call back.

## 2022-07-04 ENCOUNTER — Ambulatory Visit: Payer: PPO | Admitting: Student in an Organized Health Care Education/Training Program

## 2022-07-07 NOTE — Telephone Encounter (Signed)
This is the patient I tried to call on 06/22/22 trying to schedule her PFT. When I called at 10:30am and she answered she stated that I woke her up and to call back later in the day.  When I called back at 2:30pm same day she would pick up the phone and hang up on me at least 5 times back to back.  I just tried to call her again just now and she did the same thing picked up the phone and hung up on me

## 2022-07-14 NOTE — Telephone Encounter (Signed)
I just tried to call this patient again today and mailbox is full and I can't leave a message

## 2022-07-26 DIAGNOSIS — F3341 Major depressive disorder, recurrent, in partial remission: Secondary | ICD-10-CM | POA: Diagnosis not present

## 2022-07-26 DIAGNOSIS — M545 Low back pain, unspecified: Secondary | ICD-10-CM | POA: Diagnosis not present

## 2022-07-26 DIAGNOSIS — R0602 Shortness of breath: Secondary | ICD-10-CM | POA: Diagnosis not present

## 2022-07-26 DIAGNOSIS — I5032 Chronic diastolic (congestive) heart failure: Secondary | ICD-10-CM | POA: Diagnosis not present

## 2022-07-26 DIAGNOSIS — R5382 Chronic fatigue, unspecified: Secondary | ICD-10-CM | POA: Diagnosis not present

## 2022-07-26 DIAGNOSIS — E039 Hypothyroidism, unspecified: Secondary | ICD-10-CM | POA: Diagnosis not present

## 2022-07-26 DIAGNOSIS — R7303 Prediabetes: Secondary | ICD-10-CM | POA: Diagnosis not present

## 2022-07-26 DIAGNOSIS — F411 Generalized anxiety disorder: Secondary | ICD-10-CM | POA: Diagnosis not present

## 2022-07-26 DIAGNOSIS — R519 Headache, unspecified: Secondary | ICD-10-CM | POA: Diagnosis not present

## 2022-07-26 DIAGNOSIS — I10 Essential (primary) hypertension: Secondary | ICD-10-CM | POA: Diagnosis not present

## 2022-07-26 DIAGNOSIS — G8929 Other chronic pain: Secondary | ICD-10-CM | POA: Diagnosis not present

## 2022-07-26 DIAGNOSIS — Z0000 Encounter for general adult medical examination without abnormal findings: Secondary | ICD-10-CM | POA: Diagnosis not present

## 2022-07-27 DIAGNOSIS — M5137 Other intervertebral disc degeneration, lumbosacral region: Secondary | ICD-10-CM | POA: Diagnosis not present

## 2022-07-29 ENCOUNTER — Other Ambulatory Visit: Payer: Self-pay | Admitting: Family Medicine

## 2022-07-29 DIAGNOSIS — G8929 Other chronic pain: Secondary | ICD-10-CM

## 2022-07-31 NOTE — Telephone Encounter (Signed)
Requested medication (s) are due for refill today: yes  Requested medication (s) are on the active medication list: yes  Last refill:  09/02/21  Future visit scheduled: yes  Notes to clinic:  Unable to refill per protocol, cannot delegate.      Requested Prescriptions  Pending Prescriptions Disp Refills   butalbital-aspirin-caffeine (FIORINAL) 50-325-40 MG capsule [Pharmacy Med Name: BUTALBITAL- ASA- CAFFEINE CAP] 50 capsule 2    Sig: TAKE ONE CAPSULE BY MOUTH TWICE A DAY AS NEEDED FOR HEADACHE     Not Delegated - Analgesics:  Non-Opioid Analgesic Combinations 2 Failed - 07/29/2022  6:35 PM      Failed - This refill cannot be delegated      Passed - Cr in normal range and within 360 days    Creat  Date Value Ref Range Status  02/19/2017 0.81 0.60 - 0.93 mg/dL Final    Comment:    For patients >57 years of age, the reference limit for Creatinine is approximately 13% higher for people identified as African-American. .    Creatinine, Ser  Date Value Ref Range Status  09/02/2021 0.86 0.57 - 1.00 mg/dL Final         Passed - eGFR is 10 or above and within 360 days    GFR, Est African American  Date Value Ref Range Status  02/19/2017 84 > OR = 60 mL/min/1.57m2 Final   GFR calc Af Amer  Date Value Ref Range Status  04/22/2020 72 >59 mL/min/1.73 Final    Comment:    **In accordance with recommendations from the NKF-ASN Task force,**   [facility] is in the process of updating its eGFR calculation to the   2021 CKD-EPI creatinine equation that estimates kidney function   without a race variable.    GFR, Est Non African American  Date Value Ref Range Status  02/19/2017 73 > OR = 60 mL/min/1.68m2 Final   GFR calc non Af Amer  Date Value Ref Range Status  04/22/2020 63 >59 mL/min/1.73 Final   eGFR  Date Value Ref Range Status  09/02/2021 70 >59 mL/min/1.73 Final         Passed - Patient is not pregnant      Passed - Valid encounter within last 12 months    Recent  Outpatient Visits           7 months ago Primary hypertension   [facility] Bosie Clos, MD   11 months ago Chronic nonintractable headache, unspecified headache type   [facility] Malva Limes, MD   1 year ago Primary hypertension   [facility] Bosie Clos, MD   2 years ago Primary hypertension   [facility] Bosie Clos, MD   2 years ago Annual physical exam   [facility] Bosie Clos, MD

## 2022-10-03 DIAGNOSIS — H40153 Residual stage of open-angle glaucoma, bilateral: Secondary | ICD-10-CM | POA: Diagnosis not present

## 2022-10-31 ENCOUNTER — Other Ambulatory Visit: Payer: Self-pay

## 2022-10-31 MED ORDER — AMLODIPINE BESYLATE 5 MG PO TABS: 5.0000 mg | ORAL_TABLET | Freq: Every day | ORAL | 0 refills | Status: DC

## 2022-11-02 DIAGNOSIS — Z0000 Encounter for general adult medical examination without abnormal findings: Secondary | ICD-10-CM | POA: Diagnosis not present

## 2022-11-02 DIAGNOSIS — R7303 Prediabetes: Secondary | ICD-10-CM | POA: Diagnosis not present

## 2022-11-02 DIAGNOSIS — E039 Hypothyroidism, unspecified: Secondary | ICD-10-CM | POA: Diagnosis not present

## 2022-11-02 DIAGNOSIS — F411 Generalized anxiety disorder: Secondary | ICD-10-CM | POA: Diagnosis not present

## 2022-11-02 DIAGNOSIS — Z1231 Encounter for screening mammogram for malignant neoplasm of breast: Secondary | ICD-10-CM | POA: Diagnosis not present

## 2022-11-02 DIAGNOSIS — R519 Headache, unspecified: Secondary | ICD-10-CM | POA: Diagnosis not present

## 2022-11-02 DIAGNOSIS — I5032 Chronic diastolic (congestive) heart failure: Secondary | ICD-10-CM | POA: Diagnosis not present

## 2022-11-02 DIAGNOSIS — F3341 Major depressive disorder, recurrent, in partial remission: Secondary | ICD-10-CM | POA: Diagnosis not present

## 2022-11-02 DIAGNOSIS — I10 Essential (primary) hypertension: Secondary | ICD-10-CM | POA: Diagnosis not present

## 2022-11-03 ENCOUNTER — Other Ambulatory Visit: Payer: Self-pay | Admitting: Family Medicine

## 2022-11-03 DIAGNOSIS — Z1231 Encounter for screening mammogram for malignant neoplasm of breast: Secondary | ICD-10-CM

## 2022-11-20 ENCOUNTER — Other Ambulatory Visit: Payer: Self-pay

## 2022-11-20 DIAGNOSIS — I10 Essential (primary) hypertension: Secondary | ICD-10-CM

## 2022-11-20 MED ORDER — HYDROCHLOROTHIAZIDE 25 MG PO TABS
25.0000 mg | ORAL_TABLET | Freq: Every day | ORAL | 0 refills | Status: DC
Start: 2022-11-20 — End: 2022-11-28

## 2022-11-20 NOTE — Telephone Encounter (Signed)
Requested Prescriptions   Signed Prescriptions Disp Refills   hydrochlorothiazide (HYDRODIURIL) 25 MG tablet 90 tablet 0    Sig: Take 1 tablet (25 mg total) by mouth daily.    Authorizing Provider: Antonieta Iba    Ordering User: Guerry Minors

## 2022-11-28 ENCOUNTER — Encounter: Payer: Self-pay | Admitting: Physician Assistant

## 2022-11-28 ENCOUNTER — Ambulatory Visit: Payer: PPO | Attending: Physician Assistant | Admitting: Physician Assistant

## 2022-11-28 VITALS — BP 133/78 | HR 99 | Resp 24 | Ht 63.0 in | Wt 209.0 lb

## 2022-11-28 DIAGNOSIS — I5032 Chronic diastolic (congestive) heart failure: Secondary | ICD-10-CM

## 2022-11-28 DIAGNOSIS — E785 Hyperlipidemia, unspecified: Secondary | ICD-10-CM | POA: Diagnosis not present

## 2022-11-28 DIAGNOSIS — R5381 Other malaise: Secondary | ICD-10-CM | POA: Diagnosis not present

## 2022-11-28 DIAGNOSIS — E669 Obesity, unspecified: Secondary | ICD-10-CM

## 2022-11-28 DIAGNOSIS — I10 Essential (primary) hypertension: Secondary | ICD-10-CM | POA: Diagnosis not present

## 2022-11-28 DIAGNOSIS — Z6837 Body mass index (BMI) 37.0-37.9, adult: Secondary | ICD-10-CM | POA: Diagnosis not present

## 2022-11-28 NOTE — Progress Notes (Signed)
Cardiology Office Note    Date:  11/28/2022   ID:  Latoya Cordova, DOB 1946-02-11, MRN [id number]  PCP:  Bosie Clos, MD  Cardiologist:  Julien Nordmann, MD  Electrophysiologist:  None   Chief Complaint: Follow-up  History of Present Illness:   Latoya Cordova is a 77 y.o. female with history of HFpEF, HTN, HLD, obesity, and anxiety who presents for follow up of her HFpEF.    Prior echo from 08/2014 showed an EF of 50-55% and mild mitral regurgitation. She was admitted to the hospital in 03/2015 with acute respiratory distress felt to be secondary to viral pneumonitis vs PNA. Echo at that time showed an EF of 60-65%, mild focal and moderate concentric LVH, LVOT gradient of 41 mmHg with Valsalva, Gr1DD, mildly dilated ascending aorta measuring 37 mm, normal RVSF and PASP. CTA chest was negative for PE, unremarkable thoracic aorta, and there were no significant coronary artery calcifications or aortic atherosclerosis noted. She was treated with Levaquin with symptom improvement. She was seen virtually in 10/2018, noting chronic SOB, "due to weight," and was taking Lasix every other day. There was high fluid intake and it was noted she would eat when bored. No changes were made at that time.  She did have a mechanical fall on 01/20/2020, while squatting down to put flowers on her husband's grave and scraped her knee. No LOC and she did not hit her head.  She was seen in the office on 01/28/2020 and was doing well from a cardiac perspective.  She noted a 1 to 2-year history of progressive exertional dyspnea.  She did feel like her sedentary lifestyle was contributing to this.  Echo on 02/11/2020 showed an EF of 55 to 60%, no regional wall motion abnormalities, grade 1 diastolic dysfunction, normal RV systolic function, estimated right atrial pressure of 3 mmHg, and no significant valvular abnormalities.  She was seen in the office on 02/24/2020 and was doing well from a cardiac perspective.  She  continued to note a stable 1 to 2-year history of progressive exertional dyspnea without chest pain.  In this setting she underwent Lexiscan MPI on 03/01/2020 which showed no significant ischemia or scar and was overall low risk.  CT attenuation corrected images showed evidence of aortic atherosclerosis with minimal coronary artery calcification.  She was seen in the office in 08/2021 and was under increased stress with noted elevated BP readings.  Amlodipine 5 mg daily was added.  She was last seen in the office in 03/2022 and reported stable chronic dyspnea.  She was tolerating amlodipine, with improved blood pressure.  She continued to be under significant stress at home.  She requested referral to pulmonology to reestablish care.    She comes in doing well from a cardiac perspective and is without symptoms of angina or cardiac decompensation.  Chronic dyspnea is stable.  Awaiting scheduling of PFTs.  No significant lower extremity swelling or progressive orthopnea.  She continues to be under increased stress at home surrounding her son.  With this, she has been snacking more, though her weight is down 5 pounds when compared to her visit earlier this year in our office.  No dizziness, presyncope, or syncope.   Labs independently reviewed: 07/2022 - A1c 6.0, Hgb 14.6, PLT 169, potassium 3.7, BUN 22, serum creatinine 0.9, albumin 4.2, AST/ALT normal, TSH normal 08/2021 - TC 141, TG 99, HDL 52, LDL 71  Past Medical History:  Diagnosis Date   Anxiety  Diastolic dysfunction    a. echo 08/2014: EF 50-55%, mild MR; b. echo 03/2015: EF 60-65%, RWMA could not be excluded, GR1DD, ascending aorta mildly dilated at 37mm, normal right-sided pressure   Hyperlipidemia    Hypertension    Morbid obesity (HCC)     Past Surgical History:  Procedure Laterality Date   ABDOMINAL HYSTERECTOMY  08/18/2004   APPENDECTOMY     CATARACT EXTRACTION Bilateral     Current Medications: Current Meds  Medication Sig    amLODipine (NORVASC) 5 MG tablet Take 1 tablet (5 mg total) by mouth daily.   brimonidine (ALPHAGAN) 0.2 % ophthalmic solution 1 drop.   buPROPion (WELLBUTRIN XL) 150 MG 24 hr tablet TAKE ONE TABLET BY MOUTH ONE TIME DAILY   buPROPion (WELLBUTRIN XL) 300 MG 24 hr tablet Take 300 mg by mouth daily.   butalbital-aspirin-caffeine (FIORINAL) 50-325-40 MG capsule Take 1 capsule by mouth 2 (two) times daily as needed for headache.   Cholecalciferol (VITAMIN D) 50 MCG (2000 UT) CAPS Take 1 capsule by mouth daily.   furosemide (LASIX) 20 MG tablet TAKE 1 TABLET(20 MG) BY MOUTH EVERY MORNING   hydrochlorothiazide (HYDRODIURIL) 25 MG tablet Take 1 tablet by mouth daily.   HYDROcodone-acetaminophen (NORCO/VICODIN) 5-325 MG tablet Take 1-2 tablets by mouth every 6 (six) hours as needed for moderate pain.   ketorolac (ACULAR) 0.5 % ophthalmic solution 1 drop.   levothyroxine (SYNTHROID) 25 MCG tablet Take 1 tablet (25 mcg total) by mouth daily.   LORazepam (ATIVAN) 0.5 MG tablet TAKE ONE TABLET BY MOUTH TWICE A DAY AS NEEDED   moxifloxacin (VIGAMOX) 0.5 % ophthalmic solution Apply to eye.   PARoxetine (PAXIL) 40 MG tablet TAKE ONE TABLET BY MOUTH ONE TIME DAILY **MUST CALL DR. FOR APPOINTMENT FOR FURTHER REFILLS**   prednisoLONE acetate (PRED FORTE) 1 % ophthalmic suspension    rosuvastatin (CRESTOR) 10 MG tablet TAKE ONE TABLET BY MOUTH ONE TIME DAILY   [DISCONTINUED] hydrochlorothiazide (HYDRODIURIL) 25 MG tablet Take 1 tablet (25 mg total) by mouth daily.    Allergies:   Sulfa antibiotics and Lodine  [etodolac]   Social History   Socioeconomic History   Marital status: Widowed    Spouse name: Not on file   Number of children: 1   Years of education: Not on file   Highest education level: Associate degree: occupational, Scientist, product/process development, or vocational program  Occupational History   Occupation: retired  Tobacco Use   Smoking status: Never   Smokeless tobacco: Never  Vaping Use   Vaping status: Never  Used  Substance and Sexual Activity   Alcohol use: No   Drug use: No   Sexual activity: Not on file  Other Topics Concern   Not on file  Social History Narrative   Not on file   Social Determinants of Health   Financial Resource Strain: High Risk (11/02/2022)   Received from [facility]   Overall Financial Resource Strain (CARDIA)    Difficulty of Paying Living Expenses: Very hard  Food Insecurity: Food Insecurity Present (11/02/2022)   Received from [facility]   Hunger Vital Sign    Worried About Running Out of Food in the Last Year: Often true    Ran Out of Food in the Last Year: Sometimes true  Transportation Needs: No Transportation Needs (11/02/2022)   Received from [facility] - Transportation    In the past 12 months, has lack of transportation kept you from  medical appointments or from getting medications?: No    Lack of Transportation (Non-Medical): No  Physical Activity: Inactive (12/14/2021)   Exercise Vital Sign    Days of Exercise per Week: 0 days    Minutes of Exercise per Session: 0 min  Stress: No Stress Concern Present (12/14/2021)   [employer] of Occupational Health - Occupational Stress Questionnaire    Feeling of Stress : Only a little  Social Connections: Socially Isolated (12/14/2021)   Social Connection and Isolation Panel [NHANES]    Frequency of Communication with Friends and Family: Twice a week    Frequency of Social Gatherings with Friends and Family: More than three times a week    Attends Religious Services: Never    Database administrator or Organizations: No    Attends Banker Meetings: Never    Marital Status: Widowed     Family History:  The patient's family history includes Breast cancer in her cousin and maternal aunt; Congestive Heart Failure in her mother; Heart attack in her paternal grandfather; Hypertension in her mother; Ulcers in her father.  ROS:    12-point review of systems is negative unless otherwise noted in the HPI.   EKGs/Labs/Other Studies Reviewed:    Studies reviewed were summarized above. The additional studies were reviewed today:  2D echo 08/2014: - Left ventricle: The cavity size was normal. Systolic function was    normal. The estimated ejection fraction was in the range of 50%    to 55%.  - Mitral valve: There was mild regurgitation. __________   2D echo 03/2015: - Left ventricle: The cavity size was normal. There was mild focal    basal and moderate concentric hypertrophy of the septum. There is    LVOT gradient of 41 mm Hg with valsalva (not well visualized)    Systolic function was normal. The estimated ejection fraction was    in the range of 60% to 65%. Regional wall motion abnormalities    cannot be excluded. Doppler parameters are consistent with    abnormal left ventricular relaxation (grade 1 diastolic    dysfunction).  - Aorta: Ascending aorta is mildly dilated, diameter: 37 mm (S).  - Left atrium: The atrium was normal in size.  - Right ventricle: Systolic function was normal.  - Pulmonary arteries: Systolic pressure was within the normal    range.   Impressions:   - Challenging image quality. __________   2D echo 02/11/2020: 1. Left ventricular ejection fraction, by estimation, is 55 to 60%. The  left ventricle has normal function. The left ventricle has no regional  wall motion abnormalities. Left ventricular diastolic parameters are  consistent with Grade I diastolic  dysfunction (impaired relaxation).   2. Right ventricular systolic function is normal. The right ventricular  size is not well visualized.   3. The mitral valve is grossly normal. No evidence of mitral valve  regurgitation.   4. The aortic valve was not well visualized. Aortic valve regurgitation  not assessed.   5. The inferior vena cava is normal in size with greater than 50%  respiratory variability, suggesting right  atrial pressure of 3 mmHg. __________   Eugenie Birks MPI 03/01/2020: There was no ST segment deviation noted during stress. No T wave inversion was noted during stress. The study is normal. This is a low risk study. The left ventricular ejection fraction is hyperdynamic (>65%). CT attenuation images show evidence of aortic calcifications but minimal coronary calcifications.   EKG:  EKG is  ordered today.  The EKG ordered today demonstrates NSR with sinus arrhythmia, 99 bpm, left axis deviation, LVH poor R wave progression along the precordial leads, consistent with prior tracings  Recent Labs: No results found for requested labs within last 365 days.  Recent Lipid Panel    Component Value Date/Time   CHOL 141 09/02/2021 1100   TRIG 99 09/02/2021 1100   HDL 52 09/02/2021 1100   CHOLHDL 2.7 09/02/2021 1100   CHOLHDL 3.8 02/19/2017 1445   LDLCALC 71 09/02/2021 1100   LDLCALC 109 (H) 02/19/2017 1445   LDLDIRECT 104 (H) 04/22/2020 1411    PHYSICAL EXAM:    VS:  BP 133/78 (BP Location: Left Arm, Patient Position: Sitting, Cuff Size: Large)   Pulse 99   Resp (!) 24   Ht 5\' 3"  (1.6 m)   Wt 209 lb (94.8 kg)   SpO2 93%   BMI 37.02 kg/m   BMI: Body mass index is 37.02 kg/m.  Physical Exam Vitals reviewed.  Constitutional:      Appearance: She is well-developed.  HENT:     Head: Normocephalic and atraumatic.  Eyes:     General:        Right eye: No discharge.        Left eye: No discharge.  Neck:     Vascular: No JVD.  Cardiovascular:     Rate and Rhythm: Normal rate and regular rhythm.     Heart sounds: Normal heart sounds, S1 normal and S2 normal. Heart sounds not distant. No midsystolic click and no opening snap. No murmur heard.    No friction rub.  Pulmonary:     Effort: Pulmonary effort is normal. No respiratory distress.     Breath sounds: Normal breath sounds. No decreased breath sounds, wheezing or rales.  Chest:     Chest wall: No tenderness.  Abdominal:      General: There is no distension.  Musculoskeletal:     Cervical back: Normal range of motion.  Skin:    General: Skin is warm and dry.     Nails: There is no clubbing.  Neurological:     Mental Status: She is alert and oriented to person, place, and time.  Psychiatric:        Speech: Speech normal.        Behavior: Behavior normal.        Thought Content: Thought content normal.        Judgment: Judgment normal.     Wt Readings from Last 3 Encounters:  11/28/22 209 lb (94.8 kg)  05/04/22 213 lb 6.4 oz (96.8 kg)  04/17/22 214 lb 4 oz (97.2 kg)     ASSESSMENT & PLAN:   HFpEF: Euvolemic and well compensated.  Remains on low-dose furosemide with stable renal function.  Could consider addition of SGLT2 inhibitor or MRA once home stressors have improved in an effort to minimize healthcare burden with follow-up venipunctures.  HTN: Blood pressure is well-controlled in the office today.  She remains on amlodipine and HCTZ.  HLD: LDL 71 in 08/2021 with normal ALT.she remains on rosuvastatin 10 mg.  Obesity and physical deconditioning: Her weight is down another 5 pounds today when compared to her visit earlier this year.  Continued weight loss is encouraged through heart healthy diet and regular exercise.  Suspect obesity and physical deconditioning are contributing to her longstanding dyspnea.    Disposition: F/u with Dr. Mariah Milling or an APP in 6 months.   Medication Adjustments/Labs and Tests  Ordered: Current medicines are reviewed at length with the patient today.  Concerns regarding medicines are outlined above. Medication changes, Labs and Tests ordered today are summarized above and listed in the Patient Instructions accessible in Encounters.   Signed, Eula Listen, PA-C 11/28/2022 4:45 PM     [facility] [place] [address] [place], [place] [address] [phone]

## 2022-11-28 NOTE — Patient Instructions (Signed)
Medication Instructions:  Your Physician recommend you continue on your current medication as directed.    *If you need a refill on your cardiac medications before your next appointment, please call your pharmacy*  Lab Work: None If you have labs (blood work) drawn today and your tests are completely normal, you will receive your results only by: MyChart Message (if you have MyChart) OR A paper copy in the mail If you have any lab test that is abnormal or we need to change your treatment, we will call you to review the results.  Testing/Procedures: None  Follow-Up: At [facility], you and your health needs are our priority.  As part of our continuing mission to provide you with exceptional heart care, we have created designated Provider Care Teams.  These Care Teams include your primary Cardiologist (physician) and Advanced Practice Providers (APPs -  Physician Assistants and Nurse Practitioners) who all work together to provide you with the care you need, when you need it.  We recommend signing up for the patient portal called "MyChart".  Sign up information is provided on this After Visit Summary.  MyChart is used to connect with patients for Virtual Visits (Telemedicine).  Patients are able to view lab/test results, encounter notes, upcoming appointments, etc.  Non-urgent messages can be sent to your provider as well.   To learn more about what you can do with MyChart, go to [email or web address].    Your next appointment:   6 month(s)  Provider:   You may see Julien Nordmann, MD or one of the following Advanced Practice Providers on your designated Care Team:  Eula Listen, [place]

## 2022-12-18 ENCOUNTER — Other Ambulatory Visit: Payer: Self-pay | Admitting: *Deleted

## 2022-12-18 MED ORDER — AMLODIPINE BESYLATE 5 MG PO TABS: 5.0000 mg | ORAL_TABLET | Freq: Every day | ORAL | 2 refills | Status: DC

## 2023-02-02 DIAGNOSIS — R0602 Shortness of breath: Secondary | ICD-10-CM | POA: Diagnosis not present

## 2023-02-02 DIAGNOSIS — E039 Hypothyroidism, unspecified: Secondary | ICD-10-CM | POA: Diagnosis not present

## 2023-02-02 DIAGNOSIS — F3341 Major depressive disorder, recurrent, in partial remission: Secondary | ICD-10-CM | POA: Diagnosis not present

## 2023-02-02 DIAGNOSIS — Z0000 Encounter for general adult medical examination without abnormal findings: Secondary | ICD-10-CM | POA: Diagnosis not present

## 2023-02-02 DIAGNOSIS — I5189 Other ill-defined heart diseases: Secondary | ICD-10-CM | POA: Diagnosis not present

## 2023-02-02 DIAGNOSIS — Z139 Encounter for screening, unspecified: Secondary | ICD-10-CM | POA: Diagnosis not present

## 2023-02-02 DIAGNOSIS — R0902 Hypoxemia: Secondary | ICD-10-CM | POA: Diagnosis not present

## 2023-02-02 DIAGNOSIS — I5032 Chronic diastolic (congestive) heart failure: Secondary | ICD-10-CM | POA: Diagnosis not present

## 2023-02-02 DIAGNOSIS — Z599 Problem related to housing and economic circumstances, unspecified: Secondary | ICD-10-CM | POA: Diagnosis not present

## 2023-02-02 DIAGNOSIS — R7303 Prediabetes: Secondary | ICD-10-CM | POA: Diagnosis not present

## 2023-02-05 DIAGNOSIS — H40153 Residual stage of open-angle glaucoma, bilateral: Secondary | ICD-10-CM | POA: Diagnosis not present

## 2023-02-13 ENCOUNTER — Other Ambulatory Visit: Payer: Self-pay | Admitting: Cardiovascular Disease

## 2023-02-14 DIAGNOSIS — R051 Acute cough: Secondary | ICD-10-CM | POA: Diagnosis not present

## 2023-02-14 DIAGNOSIS — I517 Cardiomegaly: Secondary | ICD-10-CM | POA: Diagnosis not present

## 2023-02-14 DIAGNOSIS — R918 Other nonspecific abnormal finding of lung field: Secondary | ICD-10-CM | POA: Diagnosis not present

## 2023-02-14 DIAGNOSIS — I700 Atherosclerosis of aorta: Secondary | ICD-10-CM | POA: Diagnosis not present

## 2023-02-14 DIAGNOSIS — J188 Other pneumonia, unspecified organism: Secondary | ICD-10-CM | POA: Diagnosis not present

## 2023-02-14 DIAGNOSIS — R0981 Nasal congestion: Secondary | ICD-10-CM | POA: Diagnosis not present

## 2023-02-14 NOTE — Telephone Encounter (Signed)
Hi Michelle,  There's a refill being requested for hydrochlorothiazide 25 which is in the system as prescribed by "historical provider". Will Dr. Mariah Milling refill this med?  Thanks,

## 2023-03-01 NOTE — Progress Notes (Signed)
Cardiology Office Note    Date:  03/02/2023   ID:  BERENDA GARRY, DOB [birth year], MRN [id number]  PCP:  Bosie Clos, MD  Cardiologist:  Julien Nordmann, MD  Electrophysiologist:  None   Chief Complaint: Follow-up  History of Present Illness:   Latoya Cordova is a 77 y.o. female with history of HFpEF, HTN, HLD, obesity, and anxiety who presents for follow up of HFpEF.    Prior echo from 08/2014 showed an EF of 50-55% and mild mitral regurgitation. She was admitted to the hospital in 03/2015 with acute respiratory distress felt to be secondary to viral pneumonitis vs PNA. Echo at that time showed an EF of 60-65%, mild focal and moderate concentric LVH, LVOT gradient of 41 mmHg with Valsalva, Gr1DD, mildly dilated ascending aorta measuring 37 mm, normal RVSF and PASP. CTA chest was negative for PE, unremarkable thoracic aorta, and there were no significant coronary artery calcifications or aortic atherosclerosis noted. She was treated with Levaquin with symptom improvement. She was seen virtually in 10/2018, noting chronic SOB, "due to weight," and was taking Lasix every other day. There was high fluid intake and it was noted she would eat when bored. No changes were made at that time.  She did have a mechanical fall on 01/20/2020, while squatting down to put flowers on her husband's grave and scraped her knee. No LOC and she did not hit her head.  She was seen in the office on 01/28/2020 and was doing well from a cardiac perspective.  She noted a 1 to 2-year history of progressive exertional dyspnea.  She did feel like her sedentary lifestyle was contributing to this.  Echo on 02/11/2020 showed an EF of 55 to 60%, no regional wall motion abnormalities, grade 1 diastolic dysfunction, normal RV systolic function, estimated right atrial pressure of 3 mmHg, and no significant valvular abnormalities.  She was seen in the office on 02/24/2020 and was doing well from a cardiac perspective.  She  continued to note a stable 1 to 2-year history of progressive exertional dyspnea without chest pain.  In this setting she underwent Lexiscan MPI on 03/01/2020 which showed no significant ischemia or scar and was overall low risk.  CT attenuation corrected images showed evidence of aortic atherosclerosis with minimal coronary artery calcification.  She was seen in the office in 08/2021 and was under increased stress with noted elevated BP readings.  Amlodipine 5 mg daily was added.  She was last seen in the office in 11/2022 and reported chronic stable dyspnea that was being evaluated by pulmonology.  She continued to be under increased stress at home.  No changes were indicated at that time.   She was recently diagnosed with walking pneumonia.  Chest x-ray report/documentation not available for review in Epic or Care Everywhere.  She comes in today noting a slight increase in shortness of breath from baseline.  She decayed she feels like she is having to sit more upright to help with her breathing.  She is without symptoms of frank chest pain, palpitations, dizziness, presyncope, or syncope.  No lower extremity swelling or progressive orthopnea.  Her weight is down 4 pounds today when compared to revisit in 01/2023, down 9 pounds when compared to revisit in 03/2022, and down 13 pounds when compared to revisit in 11/2021.  Remains on furosemide 20 mg daily.  Not currently taking HCTZ.  She does not feel like she is still wheezing some and has follow-up with PCP for  this.   Labs independently reviewed: 01/2023 - A1c 6.1 07/2022 - Hgb 14.6, PLT 169, potassium 3.7, BUN 22, serum creatinine 0.9, albumin 4.2, AST/ALT normal, TSH normal 08/2021 - TC 141, TG 99, HDL 52, LDL 71  Past Medical History:  Diagnosis Date   Anxiety    Diastolic dysfunction    a. echo 08/2014: EF 50-55%, mild MR; b. echo 03/2015: EF 60-65%, RWMA could not be excluded, GR1DD, ascending aorta mildly dilated at 37mm, normal right-sided pressure    Hyperlipidemia    Hypertension    Morbid obesity (HCC)     Past Surgical History:  Procedure Laterality Date   ABDOMINAL HYSTERECTOMY  08/18/2004   APPENDECTOMY     CATARACT EXTRACTION Bilateral     Current Medications: Current Meds  Medication Sig   amLODipine (NORVASC) 5 MG tablet Take 1 tablet (5 mg total) by mouth daily.   brimonidine (ALPHAGAN) 0.2 % ophthalmic solution 1 drop.   buPROPion (WELLBUTRIN XL) 300 MG 24 hr tablet Take 300 mg by mouth daily.   butalbital-aspirin-caffeine (FIORINAL) 50-325-40 MG capsule Take 1 capsule by mouth 2 (two) times daily as needed for headache.   Cholecalciferol (VITAMIN D) 50 MCG (2000 UT) CAPS Take 1 capsule by mouth daily.   HYDROcodone-acetaminophen (NORCO/VICODIN) 5-325 MG tablet Take 1-2 tablets by mouth every 6 (six) hours as needed for moderate pain.   levothyroxine (SYNTHROID) 25 MCG tablet Take 1 tablet (25 mcg total) by mouth daily.   LORazepam (ATIVAN) 0.5 MG tablet TAKE ONE TABLET BY MOUTH TWICE A DAY AS NEEDED   rosuvastatin (CRESTOR) 10 MG tablet TAKE ONE TABLET BY MOUTH ONE TIME DAILY   [DISCONTINUED] hydrochlorothiazide (HYDRODIURIL) 25 MG tablet TAKE ONE TABLET BY MOUTH ONE TIME DAILY    Allergies:   Sulfa antibiotics and Lodine  [etodolac]   Social History   Socioeconomic History   Marital status: Widowed    Spouse name: Not on file   Number of children: 1   Years of education: Not on file   Highest education level: Associate degree: occupational, Scientist, product/process development, or vocational program  Occupational History   Occupation: retired  Tobacco Use   Smoking status: Never   Smokeless tobacco: Never  Vaping Use   Vaping status: Never Used  Substance and Sexual Activity   Alcohol use: No   Drug use: No   Sexual activity: Not on file  Other Topics Concern   Not on file  Social History Narrative   Not on file   Social Determinants of Health   Financial Resource Strain: High Risk (02/02/2023)   Received from [facility]   Overall Financial Resource Strain (CARDIA)    Difficulty of Paying Living Expenses: Hard  Food Insecurity: Food Insecurity Present (02/02/2023)   Received from [facility]   Hunger Vital Sign    Worried About Running Out of Food in the Last Year: Sometimes true    Ran Out of Food in the Last Year: Sometimes true  Transportation Needs: No Transportation Needs (02/02/2023)   Received from [facility] - Transportation    In the past 12 months, has lack of transportation kept you from medical appointments or from getting medications?: No    Lack of Transportation (Non-Medical): No  Physical Activity: Inactive (02/12/2023)   Received from [facility]   Exercise Vital Sign    Days of Exercise per Week: 0 days    Minutes of Exercise per Session:  0 min  Stress: Stress Concern Present (02/12/2023)   Received from [facility] - Occupational Stress Questionnaire    Feeling of Stress : Very much  Social Connections: Socially Isolated (02/12/2023)   Received from [facility]   Social Connection and Isolation Panel [NHANES]    Frequency of Communication with Friends and Family: More than three times a week    Frequency of Social Gatherings with Friends and Family: Never    Attends Religious Services: Never    Database administrator or Organizations: No    Attends Banker Meetings: Never    Marital Status: Widowed     Family History:  The patient's family history includes Breast cancer in her cousin and maternal aunt; Congestive Heart Failure in her mother; Heart attack in her paternal grandfather; Hypertension in her mother; Ulcers in her father.  ROS:   12-point review of systems is negative unless otherwise noted in the HPI.   EKGs/Labs/Other Studies Reviewed:    Studies reviewed were summarized above. The  additional studies were reviewed today:  2D echo 08/2014: - Left ventricle: The cavity size was normal. Systolic function was    normal. The estimated ejection fraction was in the range of 50%    to 55%.  - Mitral valve: There was mild regurgitation. __________   2D echo 03/2015: - Left ventricle: The cavity size was normal. There was mild focal    basal and moderate concentric hypertrophy of the septum. There is    LVOT gradient of 41 mm Hg with valsalva (not well visualized)    Systolic function was normal. The estimated ejection fraction was    in the range of 60% to 65%. Regional wall motion abnormalities    cannot be excluded. Doppler parameters are consistent with    abnormal left ventricular relaxation (grade 1 diastolic    dysfunction).  - Aorta: Ascending aorta is mildly dilated, diameter: 37 mm (S).  - Left atrium: The atrium was normal in size.  - Right ventricle: Systolic function was normal.  - Pulmonary arteries: Systolic pressure was within the normal    range.   Impressions:   - Challenging image quality. __________   2D echo 02/11/2020: 1. Left ventricular ejection fraction, by estimation, is 55 to 60%. The  left ventricle has normal function. The left ventricle has no regional  wall motion abnormalities. Left ventricular diastolic parameters are  consistent with Grade I diastolic  dysfunction (impaired relaxation).   2. Right ventricular systolic function is normal. The right ventricular  size is not well visualized.   3. The mitral valve is grossly normal. No evidence of mitral valve  regurgitation.   4. The aortic valve was not well visualized. Aortic valve regurgitation  not assessed.   5. The inferior vena cava is normal in size with greater than 50%  respiratory variability, suggesting right atrial pressure of 3 mmHg. __________   Eugenie Birks MPI 03/01/2020: There was no ST segment deviation noted during stress. No T wave inversion was noted during  stress. The study is normal. This is a low risk study. The left ventricular ejection fraction is hyperdynamic (>65%). CT attenuation images show evidence of aortic calcifications but minimal coronary calcifications.   EKG:  EKG is ordered today.  The EKG ordered today demonstrates NSR, 79 bpm, LVH, poor R wave progression along the precordial leads, baseline wandering and artifact, no acute ST-T changes  Recent Labs:  No results found for requested labs within last 365 days.  Recent Lipid Panel    Component Value Date/Time   CHOL 141 09/02/2021 1100   TRIG 99 09/02/2021 1100   HDL 52 09/02/2021 1100   CHOLHDL 2.7 09/02/2021 1100   CHOLHDL 3.8 02/19/2017 1445   LDLCALC 71 09/02/2021 1100   LDLCALC 109 (H) 02/19/2017 1445   LDLDIRECT 104 (H) 04/22/2020 1411    PHYSICAL EXAM:    VS:  BP 100/62 (BP Location: Left Arm, Patient Position: Sitting, Cuff Size: Normal)   Pulse 76   Ht 5\' 3"  (1.6 m)   Wt 205 lb 9.6 oz (93.3 kg)   SpO2 95%   BMI 36.42 kg/m   BMI: Body mass index is 36.42 kg/m.  Physical Exam Vitals reviewed.  Constitutional:      Appearance: She is well-developed.  HENT:     Head: Normocephalic and atraumatic.  Eyes:     General:        Right eye: No discharge.        Left eye: No discharge.  Neck:     Vascular: No JVD.  Cardiovascular:     Rate and Rhythm: Normal rate and regular rhythm.     Heart sounds: Normal heart sounds, S1 normal and S2 normal. Heart sounds not distant. No midsystolic click and no opening snap. No murmur heard.    No friction rub.  Pulmonary:     Effort: Pulmonary effort is normal. No respiratory distress.     Breath sounds: Examination of the right-upper field reveals wheezing. Examination of the left-upper field reveals wheezing. Wheezing present. No decreased breath sounds, rhonchi or rales.     Comments: Faint bilateral upper lobe wheezing. Chest:     Chest wall: No tenderness.  Abdominal:     General: There is no distension.   Musculoskeletal:     Cervical back: Normal range of motion.     Right lower leg: No edema.     Left lower leg: No edema.  Skin:    General: Skin is warm and dry.     Nails: There is no clubbing.  Neurological:     Mental Status: She is alert and oriented to person, place, and time.  Psychiatric:        Speech: Speech normal.        Behavior: Behavior normal.        Thought Content: Thought content normal.        Judgment: Judgment normal.     Wt Readings from Last 3 Encounters:  03/02/23 205 lb 9.6 oz (93.3 kg)  11/28/22 209 lb (94.8 kg)  05/04/22 213 lb 6.4 oz (96.8 kg)     ASSESSMENT & PLAN:   HFpEF: Euvolemic and well compensated with a weight that is down 4 pounds when compared to revisit in 11/2022 and overall down 13 pounds when compared to visit in 11/2021.  Continue furosemide 20 mg daily.  Moving forward, consider addition of MRA in follow-up.  She may not be a great candidate for SGLT2 inhibitor given concern for off target effects.  With the increase in shortness of breath, I suspect this is largely related to her recent illness, however we will update echo to ensure there is no new cardiomyopathy.  We will also perform myocardial PET/CT to evaluate for high risk ischemia.  HTN: Blood pressure is mildly low in the office today.  Hold HCTZ.  Remains on losartan and amlodipine.  HLD: LDL 71 in 08/2021.  Remains on rosuvastatin 10 mg.  Obesity with physical deconditioning: Suspect this in combination with her recent pulmonary illness is contributing to her dyspnea with plans to pursue echo and myocardial PET/CT as outlined above.   Informed Consent   Shared Decision Making/Informed Consent{  The risks [chest pain, shortness of breath, cardiac arrhythmias, dizziness, blood pressure fluctuations, myocardial infarction, stroke/transient ischemic attack, nausea, vomiting, allergic reaction, radiation exposure, metallic taste sensation and life-threatening complications  (estimated to be 1 in 10,000)], benefits (risk stratification, diagnosing coronary artery disease, treatment guidance) and alternatives of a cardiac PET stress test were discussed in detail with Ms. Fandrich and she agrees to proceed.       Disposition: F/u with Dr. Mariah Milling or an APP in 1 month.   Medication Adjustments/Labs and Tests Ordered: Current medicines are reviewed at length with the patient today.  Concerns regarding medicines are outlined above. Medication changes, Labs and Tests ordered today are summarized above and listed in the Patient Instructions accessible in Encounters.   Signed, Eula Listen, PA-C 03/02/2023 4:45 PM     [facility] [place] [address] [place], [place] [address] [phone]

## 2023-03-02 ENCOUNTER — Encounter: Payer: Self-pay | Admitting: Physician Assistant

## 2023-03-02 ENCOUNTER — Encounter: Payer: Self-pay | Admitting: Family Medicine

## 2023-03-02 ENCOUNTER — Ambulatory Visit: Payer: PPO | Attending: Physician Assistant | Admitting: Physician Assistant

## 2023-03-02 VITALS — BP 100/62 | HR 76 | Ht 63.0 in | Wt 205.6 lb

## 2023-03-02 DIAGNOSIS — I700 Atherosclerosis of aorta: Secondary | ICD-10-CM

## 2023-03-02 DIAGNOSIS — R918 Other nonspecific abnormal finding of lung field: Secondary | ICD-10-CM

## 2023-03-02 DIAGNOSIS — E785 Hyperlipidemia, unspecified: Secondary | ICD-10-CM

## 2023-03-02 DIAGNOSIS — I5032 Chronic diastolic (congestive) heart failure: Secondary | ICD-10-CM

## 2023-03-02 DIAGNOSIS — R0602 Shortness of breath: Secondary | ICD-10-CM | POA: Diagnosis not present

## 2023-03-02 DIAGNOSIS — I10 Essential (primary) hypertension: Secondary | ICD-10-CM

## 2023-03-02 DIAGNOSIS — E66812 Obesity, class 2: Secondary | ICD-10-CM | POA: Diagnosis not present

## 2023-03-02 DIAGNOSIS — R5381 Other malaise: Secondary | ICD-10-CM

## 2023-03-02 DIAGNOSIS — Z6837 Body mass index (BMI) 37.0-37.9, adult: Secondary | ICD-10-CM

## 2023-03-02 NOTE — Patient Instructions (Signed)
Medication Instructions:  Your physician recommends the following medication changes.  STOP TAKING: Hydrochlorothiazide (HCTZ)  *If you need a refill on your cardiac medications before your next appointment, please call your pharmacy*   Lab Work: None ordered at this time   Testing/Procedures: Your physician has requested that you have an echocardiogram. Echocardiography is a painless test that uses sound waves to create images of your heart. It provides your doctor with information about the size and shape of your heart and how well your heart's chambers and valves are working.   You may receive an ultrasound enhancing agent through an IV if needed to better visualize your heart during the echo. This procedure takes approximately one hour.  There are no restrictions for this procedure.  This will take place at [address] [facility]) [address], [place] [address]  Please note: We ask at that you not bring children with you during ultrasound (echo/ vascular) testing. Due to room size and safety concerns, children are not allowed in the ultrasound rooms during exams. Our front office staff cannot provide observation of children in our lobby area while testing is being conducted. An adult accompanying a patient to their appointment will only be allowed in the ultrasound room at the discretion of the ultrasound technician under special circumstances. We apologize for any inconvenience.     Please report to Radiology at [facility] Main Entrance, medical mall, 30 mins prior to your test.  [address]  [place], [place]  [phone]  How to Prepare for Your Cardiac PET/CT Stress Test:   Medication instructions: Do not take your Lasix the morning of your test Do not take nitrates (isosorbide mononitrate, Ranexa) the day before or day of test Do not take tamsulosin the day before or morning of test Hold theophylline containing medications for  12 hours. Hold Dipyridamole 48 hours prior to the test.     You may take your remaining medications with water.  2. Nothing to eat or drink, except water, 3 hours prior to arrival time.   NO caffeine/decaffeinated products, or chocolate 12 hours prior to arrival. (Please note decaffeinated beverages (teas/coffees) still contain caffeine).  If you have caffeine within 12 hours prior, the test will need to be rescheduled.   3. NO perfume, cologne or lotion on chest or abdomen area. FEMALES - Please avoid wearing dresses to this appointment.  4. Total time is 1 to 2 hours; you may want to bring reading material for the waiting time.   In preparation for your appointment, medication and supplies will be purchased.  Appointment availability is limited, so if you need to cancel or reschedule, please call the Radiology Department at [phone] [facility]) OR [phone] [facility]) 24 hours in advance to avoid a cancellation fee of $100.00  What to Expect When you Arrive:  Once you arrive and check in for your appointment, you will be taken to a preparation room within the Radiology Department.  A technologist or Nurse will obtain your medical history, verify that you are correctly prepped for the exam, and explain the procedure.  Afterwards, an IV will be started in your arm and electrodes will be placed on your skin for EKG monitoring during the stress portion of the exam. Then you will be escorted to the PET/CT scanner.  There, staff will get you positioned on the scanner and obtain a blood pressure and EKG.  During the exam, you will continue to be connected to the EKG  and blood pressure machines.  A small, safe amount of a radioactive tracer will be injected in your IV to obtain a series of pictures of your heart along with an injection of a stress agent.    After your Exam:  It is recommended that you eat a meal and drink a caffeinated beverage to counter act any effects of the stress agent.   Drink plenty of fluids for the remainder of the day and urinate frequently for the first couple of hours after the exam.  Your doctor will inform you of your test results within 7-10 business days.  For more information and frequently asked questions, please visit our website: [email or web address]  For questions about your test or how to prepare for your test, please call: Cardiac Imaging Nurse Navigators Office: [phone]    Follow-Up: At [facility], you and your health needs are our priority.  As part of our continuing mission to provide you with exceptional heart care, we have created designated Provider Care Teams.  These Care Teams include your primary Cardiologist (physician) and Advanced Practice Providers (APPs -  Physician Assistants and Nurse Practitioners) who all work together to provide you with the care you need, when you need it.  We recommend signing up for the patient portal called "MyChart".  Sign up information is provided on this After Visit Summary.  MyChart is used to connect with patients for Virtual Visits (Telemedicine).  Patients are able to view lab/test results, encounter notes, upcoming appointments, etc.  Non-urgent messages can be sent to your provider as well.   To learn more about what you can do with MyChart, go to [email or web address].    Your next appointment:   1 month(s)  Provider:   You may see Julien Nordmann, MD or one of the following Advanced Practice Providers on your designated Care Team:   Eula Listen, [place]

## 2023-03-05 ENCOUNTER — Other Ambulatory Visit: Payer: Self-pay | Admitting: Family Medicine

## 2023-03-05 ENCOUNTER — Encounter: Payer: Self-pay | Admitting: Family Medicine

## 2023-03-05 DIAGNOSIS — I700 Atherosclerosis of aorta: Secondary | ICD-10-CM

## 2023-03-05 DIAGNOSIS — R918 Other nonspecific abnormal finding of lung field: Secondary | ICD-10-CM

## 2023-03-27 ENCOUNTER — Other Ambulatory Visit: Payer: Self-pay

## 2023-03-27 ENCOUNTER — Other Ambulatory Visit: Payer: Self-pay | Admitting: Emergency Medicine

## 2023-03-27 ENCOUNTER — Ambulatory Visit: Payer: PPO | Attending: Physician Assistant

## 2023-03-27 DIAGNOSIS — R0602 Shortness of breath: Secondary | ICD-10-CM | POA: Diagnosis not present

## 2023-03-27 DIAGNOSIS — I712 Thoracic aortic aneurysm, without rupture, unspecified: Secondary | ICD-10-CM

## 2023-03-27 LAB — ECHOCARDIOGRAM COMPLETE
AR max vel: 2.11 cm2
AV Area VTI: 2.24 cm2
AV Area mean vel: 2.1 cm2
AV Mean grad: 8 mm[Hg]
AV Peak grad: 15.1 mm[Hg]
Ao pk vel: 1.94 m/s
Area-P 1/2: 3.77 cm2

## 2023-03-27 MED ORDER — PERFLUTREN LIPID MICROSPHERE
1.0000 mL | INTRAVENOUS | Status: AC | PRN
  Administered 2023-03-27: 2 mL via INTRAVENOUS

## 2023-04-02 ENCOUNTER — Telehealth: Payer: Self-pay | Admitting: Physician Assistant

## 2023-04-02 NOTE — Telephone Encounter (Signed)
 Pt called and scheduled for appt with Ryan on 04/17/23, myocardial PET scan canx'd,  Reminder letter sent and procedures explained

## 2023-04-02 NOTE — Telephone Encounter (Signed)
 Pt returning call

## 2023-04-02 NOTE — Telephone Encounter (Signed)
 Attempted to contact patient to review echo findings. Left voicemail.   Recommendations: -Patient to proceed with CTA chest/abdomen as scheduled on 04/10/2023 -Cancel myocardial PET -Schedule patient to see me after her CTA  -Will plan to pursue diagnostic R/LHC with Dr. Mady after CTA -Ensure patient has referral with CVTS after CTA and [facility]

## 2023-04-03 ENCOUNTER — Other Ambulatory Visit: Payer: Self-pay | Admitting: Family Medicine

## 2023-04-03 DIAGNOSIS — I700 Atherosclerosis of aorta: Secondary | ICD-10-CM

## 2023-04-03 DIAGNOSIS — R918 Other nonspecific abnormal finding of lung field: Secondary | ICD-10-CM

## 2023-04-04 ENCOUNTER — Other Ambulatory Visit: Payer: Self-pay | Admitting: Emergency Medicine

## 2023-04-05 ENCOUNTER — Encounter: Payer: Self-pay | Admitting: Emergency Medicine

## 2023-04-10 ENCOUNTER — Ambulatory Visit
Admission: RE | Admit: 2023-04-10 | Discharge: 2023-04-10 | Disposition: A | Discharge status: Home / Self Care | Payer: PPO | Source: Ambulatory Visit | Attending: Cardiology | Admitting: Cardiology

## 2023-04-10 DIAGNOSIS — I712 Thoracic aortic aneurysm, without rupture, unspecified: Secondary | ICD-10-CM | POA: Insufficient documentation

## 2023-04-10 LAB — POCT I-STAT CREATININE: Creatinine, Ser: 1 mg/dL (ref 0.44–1.00)

## 2023-04-10 MED ORDER — IOHEXOL 350 MG/ML SOLN
100.0000 mL | Freq: Once | INTRAVENOUS | Status: AC | PRN
  Administered 2023-04-10: 100 mL via INTRAVENOUS

## 2023-04-11 ENCOUNTER — Other Ambulatory Visit: Payer: Self-pay | Admitting: Family Medicine

## 2023-04-11 DIAGNOSIS — R918 Other nonspecific abnormal finding of lung field: Secondary | ICD-10-CM

## 2023-04-11 DIAGNOSIS — I700 Atherosclerosis of aorta: Secondary | ICD-10-CM

## 2023-04-12 ENCOUNTER — Ambulatory Visit: Payer: PPO

## 2023-04-16 ENCOUNTER — Telehealth: Payer: Self-pay | Admitting: Cardiology

## 2023-04-16 NOTE — Telephone Encounter (Signed)
Called received from Diane from [facility] Radiology. Results for CTA chest, abdomen and pelvis. Thoracic Aortic Aneurysm 6.4 cm and Abdominal Aortic Aneurysm. Dr. Azucena Cecil notified. Referral for Cardiothoracic Surgery previously placed.

## 2023-04-17 ENCOUNTER — Other Ambulatory Visit: Payer: Self-pay | Admitting: Emergency Medicine

## 2023-04-17 ENCOUNTER — Ambulatory Visit: Payer: PPO | Attending: Physician Assistant | Admitting: Physician Assistant

## 2023-04-17 ENCOUNTER — Telehealth: Payer: Self-pay | Admitting: Emergency Medicine

## 2023-04-17 ENCOUNTER — Encounter: Payer: Self-pay | Admitting: Physician Assistant

## 2023-04-17 VITALS — BP 110/58 | HR 85 | Ht 64.0 in | Wt 206.8 lb

## 2023-04-17 DIAGNOSIS — I10 Essential (primary) hypertension: Secondary | ICD-10-CM | POA: Diagnosis not present

## 2023-04-17 DIAGNOSIS — I714 Abdominal aortic aneurysm, without rupture, unspecified: Secondary | ICD-10-CM

## 2023-04-17 DIAGNOSIS — I5032 Chronic diastolic (congestive) heart failure: Secondary | ICD-10-CM

## 2023-04-17 DIAGNOSIS — E785 Hyperlipidemia, unspecified: Secondary | ICD-10-CM

## 2023-04-17 DIAGNOSIS — I7121 Aneurysm of the ascending aorta, without rupture: Secondary | ICD-10-CM | POA: Diagnosis not present

## 2023-04-17 NOTE — Progress Notes (Signed)
Cardiology Office Note    Date:  04/17/2023   ID:  Latoya Cordova, DOB 1945-09-29, MRN [id number]  PCP:  Bosie Clos, MD  Cardiologist:  Julien Nordmann, MD  Electrophysiologist:  None   Chief Complaint: Follow-up  History of Present Illness:   Latoya Cordova is a 78 y.o. female with history of HFpEF, recently diagnosed severe ascending thoracic aortic aneurysm measuring 6.4 cm by CTA in 03/2023, AAA measuring 3.9 cm in 03/2023, HTN, HLD, obesity, and anxiety who presents for follow up of CTA chest, abdomen, and pelvis.  Prior echo from 08/2014 showed an EF of 50-55% and mild mitral regurgitation. She was admitted to the hospital in 03/2015 with acute respiratory distress felt to be secondary to viral pneumonitis vs PNA. Echo at that time showed an EF of 60-65%, mild focal and moderate concentric LVH, LVOT gradient of 41 mmHg with Valsalva, Gr1DD, mildly dilated ascending aorta measuring 37 mm, normal RVSF and PASP. CTA chest was negative for PE, unremarkable thoracic aorta, and there were no significant coronary artery calcifications or aortic atherosclerosis noted. She was treated with Levaquin with symptom improvement. She was seen virtually in 10/2018, noting chronic SOB, "due to weight," and was taking Lasix every other day. There was high fluid intake and it was noted she would eat when bored. No changes were made at that time.  She did have a mechanical fall on 01/20/2020, while squatting down to put flowers on her husband's grave and scraped her knee. No LOC and she did not hit her head.  She was seen in the office on 01/28/2020 and was doing well from a cardiac perspective.  She noted a 1 to 2-year history of progressive exertional dyspnea.  She did feel like her sedentary lifestyle was contributing to this.  Echo on 02/11/2020 showed an EF of 55 to 60%, no regional wall motion abnormalities, grade 1 diastolic dysfunction, normal RV systolic function, estimated right atrial pressure of 3  mmHg, and no significant valvular abnormalities.  She was seen in the office on 02/24/2020 and was doing well from a cardiac perspective.  She continued to note a stable 1 to 2-year history of progressive exertional dyspnea without chest pain.  In this setting she underwent Lexiscan MPI on 03/01/2020 which showed no significant ischemia or scar and was overall low risk.  CT attenuation corrected images showed evidence of aortic atherosclerosis with minimal coronary artery calcification.  She was most recently seen in the office in 02/2019 for noting an increase in shortness of breath from baseline chronic dyspnea.  She was without symptoms of frank chest pain.  At that time, she was scheduled for myocardial PET/CT and echo.  Subsequent echo on 03/27/2023 showed an EF of 60 to 65%, no regional wall motion abnormalities, grade 1 diastolic dysfunction, normal RV systolic function, poorly visualized aortic valve with mild aortic insufficiency, and a severe dilatation of the ascending aorta measuring 58 mm along with borderline dilatation of the aortic root measuring 38 mm (aorta documented to be normal in size and structure by echo in 01/2020).  In this setting, myocardial PET/CT was canceled with recommendation to proceed with [facility] following CTA of aortic aneurysm.  Subsequent CTA chest, abdomen, pelvis on 04/10/2023 showed ascending thoracic aortic aneurysm measuring up to 6.4 cm, abdominal aortic aneurysm measuring 3.9 cm, aortic atherosclerosis, cardiomegaly, and colonic diverticulosis without evidence of diverticulitis.  Subsequent review of imaging from 2021 shows an ascending thoracic aortic aneurysm measuring 5 to 5.5  cm at that time.  She is scheduled to see CT surgery on 05/01/2023.   She comes in today noting stable shortness of breath without frank angina.  No palpitations, dizziness, presyncope, or syncope.  No lower extremity swelling or progressive orthopnea.  Weight stable.  She does report upper left and  right-sided chest discomfort that has been present for many years that improves when rubbing her hands across her chest.  Not taking HCTZ.   Labs independently reviewed: 01/2023 - A1c 6.1 07/2022 - Hgb 14.6, PLT 169, potassium 3.7, BUN 22, serum creatinine 0.9, albumin 4.2, AST/ALT normal, TSH normal 08/2021 - TC 141, TG 99, HDL 52, LDL 71  Past Medical History:  Diagnosis Date   Anxiety    Diastolic dysfunction    a. echo 08/2014: EF 50-55%, mild MR; b. echo 03/2015: EF 60-65%, RWMA could not be excluded, GR1DD, ascending aorta mildly dilated at 37mm, normal right-sided pressure   Hyperlipidemia    Hypertension    Morbid obesity (HCC)     Past Surgical History:  Procedure Laterality Date   ABDOMINAL HYSTERECTOMY  08/18/2004   APPENDECTOMY     CATARACT EXTRACTION Bilateral     Current Medications: Current Meds  Medication Sig   amLODipine (NORVASC) 5 MG tablet Take 1 tablet (5 mg total) by mouth daily.   brimonidine (ALPHAGAN) 0.2 % ophthalmic solution 1 drop.   buPROPion (WELLBUTRIN XL) 300 MG 24 hr tablet Take 300 mg by mouth daily.   butalbital-aspirin-caffeine (FIORINAL) 50-325-40 MG capsule Take 1 capsule by mouth 2 (two) times daily as needed for headache.   Cholecalciferol (VITAMIN D) 50 MCG (2000 UT) CAPS Take 1 capsule by mouth daily.   citalopram (CELEXA) 10 MG tablet Take 10 mg by mouth daily.   HYDROcodone-acetaminophen (NORCO/VICODIN) 5-325 MG tablet Take 1-2 tablets by mouth every 6 (six) hours as needed for moderate pain.   ketorolac (ACULAR) 0.5 % ophthalmic solution 1 drop.   levothyroxine (SYNTHROID) 25 MCG tablet Take 1 tablet (25 mcg total) by mouth daily.   losartan (COZAAR) 50 MG tablet Take 50 mg by mouth daily.   rosuvastatin (CRESTOR) 10 MG tablet TAKE ONE TABLET BY MOUTH ONE TIME DAILY    Allergies:   Sulfa antibiotics and Lodine  [etodolac]   Social History   Socioeconomic History   Marital status: Widowed    Spouse name: Not on file   Number of  children: 1   Years of education: Not on file   Highest education level: Associate degree: occupational, Scientist, product/process development, or vocational program  Occupational History   Occupation: retired  Tobacco Use   Smoking status: Never   Smokeless tobacco: Never  Vaping Use   Vaping status: Never Used  Substance and Sexual Activity   Alcohol use: No   Drug use: No   Sexual activity: Not on file  Other Topics Concern   Not on file  Social History Narrative   Not on file   Social Drivers of Health   Financial Resource Strain: High Risk (02/02/2023)   Received from [facility]   Overall Financial Resource Strain (CARDIA)    Difficulty of Paying Living Expenses: Hard  Food Insecurity: Food Insecurity Present (02/02/2023)   Received from [facility]   Hunger Vital Sign    Worried About Running Out of Food in the Last Year: Sometimes true    Ran Out of Food in the Last Year: Sometimes true  Transportation Needs: No Transportation Needs (02/02/2023)  Received from [facility] - Transportation    In the past 12 months, has lack of transportation kept you from medical appointments or from getting medications?: No    Lack of Transportation (Non-Medical): No  Physical Activity: Inactive (02/12/2023)   Received from [facility]   Exercise Vital Sign    Days of Exercise per Week: 0 days    Minutes of Exercise per Session: 0 min  Stress: Stress Concern Present (02/12/2023)   Received from [facility] - Occupational Stress Questionnaire    Feeling of Stress : Very much  Social Connections: Socially Isolated (02/12/2023)   Received from [facility]   Social Connection and Isolation Panel [NHANES]    Frequency of Communication with Friends and Family: More than three times a week    Frequency of Social Gatherings with Friends and Family: Never     Attends Religious Services: Never    Database administrator or Organizations: No    Attends Banker Meetings: Never    Marital Status: Widowed     Family History:  The patient's family history includes Breast cancer in her cousin and maternal aunt; Congestive Heart Failure in her mother; Heart attack in her paternal grandfather; Hypertension in her mother; Ulcers in her father.  ROS:   12-point review of systems is negative unless otherwise noted in the HPI.   EKGs/Labs/Other Studies Reviewed:    Studies reviewed were summarized above. The additional studies were reviewed today:  2D echo 08/2014: - Left ventricle: The cavity size was normal. Systolic function was    normal. The estimated ejection fraction was in the range of 50%    to 55%.  - Mitral valve: There was mild regurgitation. __________   2D echo 03/2015: - Left ventricle: The cavity size was normal. There was mild focal    basal and moderate concentric hypertrophy of the septum. There is    LVOT gradient of 41 mm Hg with valsalva (not well visualized)    Systolic function was normal. The estimated ejection fraction was    in the range of 60% to 65%. Regional wall motion abnormalities    cannot be excluded. Doppler parameters are consistent with    abnormal left ventricular relaxation (grade 1 diastolic    dysfunction).  - Aorta: Ascending aorta is mildly dilated, diameter: 37 mm (S).  - Left atrium: The atrium was normal in size.  - Right ventricle: Systolic function was normal.  - Pulmonary arteries: Systolic pressure was within the normal    range.   Impressions:   - Challenging image quality. __________   2D echo 02/11/2020: 1. Left ventricular ejection fraction, by estimation, is 55 to 60%. The  left ventricle has normal function. The left ventricle has no regional  wall motion abnormalities. Left ventricular diastolic parameters are  consistent with Grade I diastolic  dysfunction (impaired  relaxation).   2. Right ventricular systolic function is normal. The right ventricular  size is not well visualized.   3. The mitral valve is grossly normal. No evidence of mitral valve  regurgitation.   4. The aortic valve was not well visualized. Aortic valve regurgitation  not assessed.   5. The inferior vena cava is normal in size with greater than 50%  respiratory variability, suggesting right atrial pressure of 3 mmHg. __________   Eugenie Birks MPI 03/01/2020: There was no ST segment deviation noted  during stress. No T wave inversion was noted during stress. The study is normal. This is a low risk study. The left ventricular ejection fraction is hyperdynamic (>65%). CT attenuation images show evidence of aortic calcifications but minimal coronary calcifications. __________  2D echo 03/27/2023: 1. Left ventricular ejection fraction, by estimation, is 60 to 65%. The  left ventricle has normal function. The left ventricle has no regional  wall motion abnormalities. Left ventricular diastolic parameters are  consistent with Grade I diastolic  dysfunction (impaired relaxation).   2. Right ventricular systolic function is normal. The right ventricular  size is not well visualized.   3. The mitral valve is grossly normal. No evidence of mitral valve  regurgitation.   4. The aortic valve was not well visualized. Aortic valve regurgitation  is mild.   5. Aortic dilatation noted. There is severe dilatation of the ascending  aorta, measuring 58 mm. There is borderline dilatation of the aortic root,  measuring 38 mm.   Conclusion(s)/Recommendation(s): Severe ascending aorta dilation, 58 mm in  diameter. Urgent referal to CT surgery placed. CT angio chest and abdomen  to evaluate aorta also placed.     EKG:  EKG is not ordered today.    Recent Labs: 04/10/2023: Creatinine, Ser 1.00  Recent Lipid Panel    Component Value Date/Time   CHOL 141 09/02/2021 1100   TRIG 99 09/02/2021  1100   HDL 52 09/02/2021 1100   CHOLHDL 2.7 09/02/2021 1100   CHOLHDL 3.8 02/19/2017 1445   LDLCALC 71 09/02/2021 1100   LDLCALC 109 (H) 02/19/2017 1445   LDLDIRECT 104 (H) 04/22/2020 1411    PHYSICAL EXAM:    VS:  BP (!) 110/58 (BP Location: Left Arm, Patient Position: Sitting, Cuff Size: Normal)   Pulse 85   Ht 5\' 4"  (1.626 m)   Wt 206 lb 12.8 oz (93.8 kg)   SpO2 93%   BMI 35.50 kg/m   BMI: Body mass index is 35.5 kg/m.  Physical Exam Vitals reviewed.  Constitutional:      Appearance: She is well-developed.  HENT:     Head: Normocephalic and atraumatic.  Eyes:     General:        Right eye: No discharge.        Left eye: No discharge.  Neck:     Vascular: No JVD.  Cardiovascular:     Rate and Rhythm: Normal rate and regular rhythm.     Heart sounds: Normal heart sounds, S1 normal and S2 normal. Heart sounds not distant. No midsystolic click and no opening snap. No murmur heard.    No friction rub.  Pulmonary:     Effort: Pulmonary effort is normal. No respiratory distress.     Breath sounds: Normal breath sounds. No decreased breath sounds, wheezing, rhonchi or rales.  Chest:     Chest wall: No tenderness.  Abdominal:     General: There is no distension.  Musculoskeletal:     Cervical back: Normal range of motion.  Skin:    General: Skin is warm and dry.     Nails: There is no clubbing.  Neurological:     Mental Status: She is alert and oriented to person, place, and time.  Psychiatric:        Speech: Speech normal.        Behavior: Behavior normal.        Thought Content: Thought content normal.        Judgment: Judgment normal.  Wt Readings from Last 3 Encounters:  04/17/23 206 lb 12.8 oz (93.8 kg)  03/02/23 205 lb 9.6 oz (93.3 kg)  11/28/22 209 lb (94.8 kg)     ASSESSMENT & PLAN:   Severe ascending thoracic aortic aneurysm: Noted on echo in 02/2023 measuring 5.8 cm.  Follow-up CTA chest, abdomen, pelvis, showed ascending thoracic aortic  aneurysm measuring 6.4 cm.  Upon review of echo and CTA images from Lexiscan MPI in 2021, ascending thoracic aorta was measuring 5 to 5.5 cm at that time.  Historically, aortic valve has been poorly visualized.  Nonetheless, she requires surgical intervention for ascending thoracic aortic aneurysm.  Schedule diagnostic R/LHC.  She has been referred to cardiothoracic surgery and has an appointment with them on 2//2025.  Continue optimal blood pressure control.  Precautions discussed.  HFpEF: Euvolemic and well compensated.  She remains on furosemide 20 mg daily.  Given body habitus, she may not be a good candidate for SGLT2 inhibitor.  Anticipate further escalation of GDMT as tolerated with addition of MRA.  AAA: Incidentally noted on CTA.  Follow-up imaging in 12 months.  HTN: Blood pressure is well-controlled in the office.  She remains on amlodipine 5 mg and losartan 50 mg.  HLD: LDL 71 in 08/2021.  She remains on rosuvastatin 10 mg.    Informed Consent   Shared Decision Making/Informed Consent{  The risks [stroke (1 in 1000), death (1 in 1000), kidney failure [usually temporary] (1 in 500), bleeding (1 in 200), allergic reaction [possibly serious] (1 in 200)], benefits (diagnostic support and management of coronary artery disease) and alternatives of a cardiac catheterization were discussed in detail with Ms. Ketner and she is willing to proceed.       Disposition: F/u with Dr. Mariah Milling or an APP in 3 weeks.   Medication Adjustments/Labs and Tests Ordered: Current medicines are reviewed at length with the patient today.  Concerns regarding medicines are outlined above. Medication changes, Labs and Tests ordered today are summarized above and listed in the Patient Instructions accessible in Encounters.   Signed, Eula Listen, PA-C 04/17/2023 2:29 PM     [facility] [address] [place], [place] [address] [phone]

## 2023-04-17 NOTE — H&P (View-Only) (Signed)
Cardiology Office Note    Date:  04/17/2023   ID:  TYEISHA Cordova, DOB [birth year], MRN [id number]  PCP:  Bosie Clos, MD  Cardiologist:  Julien Nordmann, MD  Electrophysiologist:  None   Chief Complaint: Follow-up  History of Present Illness:   Latoya Cordova is a 78 y.o. female with history of HFpEF, recently diagnosed severe ascending thoracic aortic aneurysm measuring 6.4 cm by CTA in 03/2023, AAA measuring 3.9 cm in 03/2023, HTN, HLD, obesity, and anxiety who presents for follow up of CTA chest, abdomen, and pelvis.  Prior echo from 08/2014 showed an EF of 50-55% and mild mitral regurgitation. She was admitted to the hospital in 03/2015 with acute respiratory distress felt to be secondary to viral pneumonitis vs PNA. Echo at that time showed an EF of 60-65%, mild focal and moderate concentric LVH, LVOT gradient of 41 mmHg with Valsalva, Gr1DD, mildly dilated ascending aorta measuring 37 mm, normal RVSF and PASP. CTA chest was negative for PE, unremarkable thoracic aorta, and there were no significant coronary artery calcifications or aortic atherosclerosis noted. She was treated with Levaquin with symptom improvement. She was seen virtually in 10/2018, noting chronic SOB, "due to weight," and was taking Lasix every other day. There was high fluid intake and it was noted she would eat when bored. No changes were made at that time.  She did have a mechanical fall on 01/20/2020, while squatting down to put flowers on her husband's grave and scraped her knee. No LOC and she did not hit her head.  She was seen in the office on 01/28/2020 and was doing well from a cardiac perspective.  She noted a 1 to 2-year history of progressive exertional dyspnea.  She did feel like her sedentary lifestyle was contributing to this.  Echo on 02/11/2020 showed an EF of 55 to 60%, no regional wall motion abnormalities, grade 1 diastolic dysfunction, normal RV systolic function, estimated right atrial pressure of 3  mmHg, and no significant valvular abnormalities.  She was seen in the office on 02/24/2020 and was doing well from a cardiac perspective.  She continued to note a stable 1 to 2-year history of progressive exertional dyspnea without chest pain.  In this setting she underwent Lexiscan MPI on 03/01/2020 which showed no significant ischemia or scar and was overall low risk.  CT attenuation corrected images showed evidence of aortic atherosclerosis with minimal coronary artery calcification.  She was most recently seen in the office in 02/2019 for noting an increase in shortness of breath from baseline chronic dyspnea.  She was without symptoms of frank chest pain.  At that time, she was scheduled for myocardial PET/CT and echo.  Subsequent echo on 03/27/2023 showed an EF of 60 to 65%, no regional wall motion abnormalities, grade 1 diastolic dysfunction, normal RV systolic function, poorly visualized aortic valve with mild aortic insufficiency, and a severe dilatation of the ascending aorta measuring 58 mm along with borderline dilatation of the aortic root measuring 38 mm (aorta documented to be normal in size and structure by echo in 01/2020).  In this setting, myocardial PET/CT was canceled with recommendation to proceed with [facility] following CTA of aortic aneurysm.  Subsequent CTA chest, abdomen, pelvis on 04/10/2023 showed ascending thoracic aortic aneurysm measuring up to 6.4 cm, abdominal aortic aneurysm measuring 3.9 cm, aortic atherosclerosis, cardiomegaly, and colonic diverticulosis without evidence of diverticulitis.  Subsequent review of imaging from 2021 shows an ascending thoracic aortic aneurysm measuring 5 to 5.5  cm at that time.  She is scheduled to see CT surgery on 05/01/2023.   She comes in today noting stable shortness of breath without frank angina.  No palpitations, dizziness, presyncope, or syncope.  No lower extremity swelling or progressive orthopnea.  Weight stable.  She does report upper left and  right-sided chest discomfort that has been present for many years that improves when rubbing her hands across her chest.  Not taking HCTZ.   Labs independently reviewed: 01/2023 - A1c 6.1 07/2022 - Hgb 14.6, PLT 169, potassium 3.7, BUN 22, serum creatinine 0.9, albumin 4.2, AST/ALT normal, TSH normal 08/2021 - TC 141, TG 99, HDL 52, LDL 71  Past Medical History:  Diagnosis Date   Anxiety    Diastolic dysfunction    a. echo 08/2014: EF 50-55%, mild MR; b. echo 03/2015: EF 60-65%, RWMA could not be excluded, GR1DD, ascending aorta mildly dilated at 37mm, normal right-sided pressure   Hyperlipidemia    Hypertension    Morbid obesity (HCC)     Past Surgical History:  Procedure Laterality Date   ABDOMINAL HYSTERECTOMY  08/18/2004   APPENDECTOMY     CATARACT EXTRACTION Bilateral     Current Medications: Current Meds  Medication Sig   amLODipine (NORVASC) 5 MG tablet Take 1 tablet (5 mg total) by mouth daily.   brimonidine (ALPHAGAN) 0.2 % ophthalmic solution 1 drop.   buPROPion (WELLBUTRIN XL) 300 MG 24 hr tablet Take 300 mg by mouth daily.   butalbital-aspirin-caffeine (FIORINAL) 50-325-40 MG capsule Take 1 capsule by mouth 2 (two) times daily as needed for headache.   Cholecalciferol (VITAMIN D) 50 MCG (2000 UT) CAPS Take 1 capsule by mouth daily.   citalopram (CELEXA) 10 MG tablet Take 10 mg by mouth daily.   HYDROcodone-acetaminophen (NORCO/VICODIN) 5-325 MG tablet Take 1-2 tablets by mouth every 6 (six) hours as needed for moderate pain.   ketorolac (ACULAR) 0.5 % ophthalmic solution 1 drop.   levothyroxine (SYNTHROID) 25 MCG tablet Take 1 tablet (25 mcg total) by mouth daily.   losartan (COZAAR) 50 MG tablet Take 50 mg by mouth daily.   rosuvastatin (CRESTOR) 10 MG tablet TAKE ONE TABLET BY MOUTH ONE TIME DAILY    Allergies:   Sulfa antibiotics and Lodine  [etodolac]   Social History   Socioeconomic History   Marital status: Widowed    Spouse name: Not on file   Number of  children: 1   Years of education: Not on file   Highest education level: Associate degree: occupational, Scientist, product/process development, or vocational program  Occupational History   Occupation: retired  Tobacco Use   Smoking status: Never   Smokeless tobacco: Never  Vaping Use   Vaping status: Never Used  Substance and Sexual Activity   Alcohol use: No   Drug use: No   Sexual activity: Not on file  Other Topics Concern   Not on file  Social History Narrative   Not on file   Social Drivers of Health   Financial Resource Strain: High Risk (02/02/2023)   Received from [facility]   Overall Financial Resource Strain (CARDIA)    Difficulty of Paying Living Expenses: Hard  Food Insecurity: Food Insecurity Present (02/02/2023)   Received from [facility]   Hunger Vital Sign    Worried About Running Out of Food in the Last Year: Sometimes true    Ran Out of Food in the Last Year: Sometimes true  Transportation Needs: No Transportation Needs (02/02/2023)  Received from [facility] - Transportation    In the past 12 months, has lack of transportation kept you from medical appointments or from getting medications?: No    Lack of Transportation (Non-Medical): No  Physical Activity: Inactive (02/12/2023)   Received from [facility]   Exercise Vital Sign    Days of Exercise per Week: 0 days    Minutes of Exercise per Session: 0 min  Stress: Stress Concern Present (02/12/2023)   Received from [facility] Occup[facility] - Occupational Stress Questionnaire    Feeling of Stress : Very much  Social Connections: Socially Isolated (02/12/2023)   Received from [facility]   Social Connection and Isolation Panel [NHANES]    Frequency of Communication with Friends and Family: More than three times a week    Frequency of Social Gatherings with Friends and Family: Never     Attends Religious Services: Never    Database administrator or Organizations: No    Attends Banker Meetings: Never    Marital Status: Widowed     Family History:  The patient's family history includes Breast cancer in her cousin and maternal aunt; Congestive Heart Failure in her mother; Heart attack in her paternal grandfather; Hypertension in her mother; Ulcers in her father.  ROS:   12-point review of systems is negative unless otherwise noted in the HPI.   EKGs/Labs/Other Studies Reviewed:    Studies reviewed were summarized above. The additional studies were reviewed today:  2D echo 08/2014: - Left ventricle: The cavity size was normal. Systolic function was    normal. The estimated ejection fraction was in the range of 50%    to 55%.  - Mitral valve: There was mild regurgitation. __________   2D echo 03/2015: - Left ventricle: The cavity size was normal. There was mild focal    basal and moderate concentric hypertrophy of the septum. There is    LVOT gradient of 41 mm Hg with valsalva (not well visualized)    Systolic function was normal. The estimated ejection fraction was    in the range of 60% to 65%. Regional wall motion abnormalities    cannot be excluded. Doppler parameters are consistent with    abnormal left ventricular relaxation (grade 1 diastolic    dysfunction).  - Aorta: Ascending aorta is mildly dilated, diameter: 37 mm (S).  - Left atrium: The atrium was normal in size.  - Right ventricle: Systolic function was normal.  - Pulmonary arteries: Systolic pressure was within the normal    range.   Impressions:   - Challenging image quality. __________   2D echo 02/11/2020: 1. Left ventricular ejection fraction, by estimation, is 55 to 60%. The  left ventricle has normal function. The left ventricle has no regional  wall motion abnormalities. Left ventricular diastolic parameters are  consistent with Grade I diastolic  dysfunction (impaired  relaxation).   2. Right ventricular systolic function is normal. The right ventricular  size is not well visualized.   3. The mitral valve is grossly normal. No evidence of mitral valve  regurgitation.   4. The aortic valve was not well visualized. Aortic valve regurgitation  not assessed.   5. The inferior vena cava is normal in size with greater than 50%  respiratory variability, suggesting right atrial pressure of 3 mmHg. __________   Eugenie Birks MPI 03/01/2020: There was no ST segment deviation noted  during stress. No T wave inversion was noted during stress. The study is normal. This is a low risk study. The left ventricular ejection fraction is hyperdynamic (>65%). CT attenuation images show evidence of aortic calcifications but minimal coronary calcifications. __________  2D echo 03/27/2023: 1. Left ventricular ejection fraction, by estimation, is 60 to 65%. The  left ventricle has normal function. The left ventricle has no regional  wall motion abnormalities. Left ventricular diastolic parameters are  consistent with Grade I diastolic  dysfunction (impaired relaxation).   2. Right ventricular systolic function is normal. The right ventricular  size is not well visualized.   3. The mitral valve is grossly normal. No evidence of mitral valve  regurgitation.   4. The aortic valve was not well visualized. Aortic valve regurgitation  is mild.   5. Aortic dilatation noted. There is severe dilatation of the ascending  aorta, measuring 58 mm. There is borderline dilatation of the aortic root,  measuring 38 mm.   Conclusion(s)/Recommendation(s): Severe ascending aorta dilation, 58 mm in  diameter. Urgent referal to CT surgery placed. CT angio chest and abdomen  to evaluate aorta also placed.     EKG:  EKG is not ordered today.    Recent Labs: 04/10/2023: Creatinine, Ser 1.00  Recent Lipid Panel    Component Value Date/Time   CHOL 141 09/02/2021 1100   TRIG 99 09/02/2021  1100   HDL 52 09/02/2021 1100   CHOLHDL 2.7 09/02/2021 1100   CHOLHDL 3.8 02/19/2017 1445   LDLCALC 71 09/02/2021 1100   LDLCALC 109 (H) 02/19/2017 1445   LDLDIRECT 104 (H) 04/22/2020 1411    PHYSICAL EXAM:    VS:  BP (!) 110/58 (BP Location: Left Arm, Patient Position: Sitting, Cuff Size: Normal)   Pulse 85   Ht 5\' 4"  (1.626 m)   Wt 206 lb 12.8 oz (93.8 kg)   SpO2 93%   BMI 35.50 kg/m   BMI: Body mass index is 35.5 kg/m.  Physical Exam Vitals reviewed.  Constitutional:      Appearance: She is well-developed.  HENT:     Head: Normocephalic and atraumatic.  Eyes:     General:        Right eye: No discharge.        Left eye: No discharge.  Neck:     Vascular: No JVD.  Cardiovascular:     Rate and Rhythm: Normal rate and regular rhythm.     Heart sounds: Normal heart sounds, S1 normal and S2 normal. Heart sounds not distant. No midsystolic click and no opening snap. No murmur heard.    No friction rub.  Pulmonary:     Effort: Pulmonary effort is normal. No respiratory distress.     Breath sounds: Normal breath sounds. No decreased breath sounds, wheezing, rhonchi or rales.  Chest:     Chest wall: No tenderness.  Abdominal:     General: There is no distension.  Musculoskeletal:     Cervical back: Normal range of motion.  Skin:    General: Skin is warm and dry.     Nails: There is no clubbing.  Neurological:     Mental Status: She is alert and oriented to person, place, and time.  Psychiatric:        Speech: Speech normal.        Behavior: Behavior normal.        Thought Content: Thought content normal.        Judgment: Judgment normal.  Wt Readings from Last 3 Encounters:  04/17/23 206 lb 12.8 oz (93.8 kg)  03/02/23 205 lb 9.6 oz (93.3 kg)  11/28/22 209 lb (94.8 kg)     ASSESSMENT & PLAN:   Severe ascending thoracic aortic aneurysm: Noted on echo in 02/2023 measuring 5.8 cm.  Follow-up CTA chest, abdomen, pelvis, showed ascending thoracic aortic  aneurysm measuring 6.4 cm.  Upon review of echo and CTA images from Lexiscan MPI in 2021, ascending thoracic aorta was measuring 5 to 5.5 cm at that time.  Historically, aortic valve has been poorly visualized.  Nonetheless, she requires surgical intervention for ascending thoracic aortic aneurysm.  Schedule diagnostic R/LHC.  She has been referred to cardiothoracic surgery and has an appointment with them on 2//2025.  Continue optimal blood pressure control.  Precautions discussed.  HFpEF: Euvolemic and well compensated.  She remains on furosemide 20 mg daily.  Given body habitus, she may not be a good candidate for SGLT2 inhibitor.  Anticipate further escalation of GDMT as tolerated with addition of MRA.  AAA: Incidentally noted on CTA.  Follow-up imaging in 12 months.  HTN: Blood pressure is well-controlled in the office.  She remains on amlodipine 5 mg and losartan 50 mg.  HLD: LDL 71 in 08/2021.  She remains on rosuvastatin 10 mg.    Informed Consent   Shared Decision Making/Informed Consent{  The risks [stroke (1 in 1000), death (1 in 1000), kidney failure [usually temporary] (1 in 500), bleeding (1 in 200), allergic reaction [possibly serious] (1 in 200)], benefits (diagnostic support and management of coronary artery disease) and alternatives of a cardiac catheterization were discussed in detail with Ms. Houseworth and she is willing to proceed.       Disposition: F/u with Dr. Mariah Milling or an APP in 3 weeks.   Medication Adjustments/Labs and Tests Ordered: Current medicines are reviewed at length with the patient today.  Concerns regarding medicines are outlined above. Medication changes, Labs and Tests ordered today are summarized above and listed in the Patient Instructions accessible in Encounters.   Signed, Eula Listen, PA-C 04/17/2023 2:29 PM     [facility] [place] [address] [place], [place] [address] [phone]

## 2023-04-17 NOTE — Telephone Encounter (Signed)
CTA ordered by Dr Sullivan Lone canceled per Eula Listen, PA-C as pt will be seen by Dr Garen Grams in [facility] and the CTA is redundant after the last CTA for AAA

## 2023-04-17 NOTE — Patient Instructions (Signed)
Medication Instructions:  None ordered at this time  *If you need a refill on your cardiac medications before your next appointment, please call your pharmacy*   Lab Work: Your provider would like for you to have following labs drawn today CBC and BMeT.   If you have labs (blood work) drawn today and your tests are completely normal, you will receive your results only by: MyChart Message (if you have MyChart) OR A paper copy in the mail If you have any lab test that is abnormal or we need to change your treatment, we will call you to review the results.   Testing/Procedures:  [facility] DEPT OF [place] [address] [place] [address] Dept: [phone] Loc: [phone]  Latoya Cordova  04/17/2023  You are scheduled for a Cardiac Catheterization on Wednesday, January 29 with Dr. Cristal Deer End.  1. Please arrive at the Heart & Vascular Center Entrance of [facility], [address], [place] [address] at 10:30 AM (This is 1 hour(s) prior to your procedure time).  Proceed to the Check-In Desk directly inside the entrance.  Procedure Parking: Use the entrance off of the [address] side of the hospital. Turn right upon entering and follow the driveway to parking that is directly in front of the Heart & Vascular Center. There is no valet parking available at this entrance, however there is an awning directly in front of the Heart & Vascular Center for drop off/ pick up for patients.  Special note: Every effort is made to have your procedure done on time. Please understand that emergencies sometimes delay scheduled procedures.  2. Diet: Do not eat solid foods after midnight.  The patient may have clear liquids until 5am upon the day of the procedure.  3. Labs: You will need to have blood drawn today  4. Medication instructions in preparation for your procedure:   Contrast Allergy:  No   Stop taking, Lasix (Furosemide)  and HTCZ (Hydrochlorothiazide) Tuesday, January 28, - that evening -- So hold these diuretics the morning of the procedure.  On the morning of your procedure, take your Aspirin 81 mg and any morning medicines NOT listed above.  You may use sips of water.  5. Plan to go home the same day, you will only stay overnight if medically necessary. 6. Bring a current list of your medications and current insurance cards. 7. You MUST have a responsible person to drive you home. 8. Someone MUST be with you the first 24 hours after you arrive home or your discharge will be delayed. 9. Please wear clothes that are easy to get on and off and wear slip-on shoes.  Thank you for allowing [place] to care for you!   -- [facility]    Follow-Up: At [facility], you and your health needs are our priority.  As part of our continuing mission to provide you with exceptional heart care, we have created designated Provider Care Teams.  These Care Teams include your primary Cardiologist (physician) and Advanced Practice Providers (APPs -  Physician Assistants and Nurse Practitioners) who all work together to provide you with the care you need, when you need it.  We recommend signing up for the patient portal called "MyChart".  Sign up information is provided on this After Visit Summary.  MyChart is used to connect with patients for Virtual Visits (Telemedicine).  Patients are able to  view lab/test results, encounter notes, upcoming appointments, etc.  Non-urgent messages can be sent to your provider as well.   To learn more about what you can do with MyChart, go to [email or web address].    Your next appointment:   3 week(s)  Provider:   You may see Julien Nordmann, MD or one of the following Advanced Practice Providers on your designated Care Team:   Eula Listen, [place]

## 2023-04-18 LAB — BASIC METABOLIC PANEL
BUN/Creatinine Ratio: 19 (ref 12–28)
BUN: 19 mg/dL (ref 8–27)
CO2: 24 mmol/L (ref 20–29)
Calcium: 9.4 mg/dL (ref 8.7–10.3)
Chloride: 98 mmol/L (ref 96–106)
Creatinine, Ser: 1 mg/dL (ref 0.57–1.00)
Glucose: 68 mg/dL — ABNORMAL LOW (ref 70–99)
Potassium: 3.9 mmol/L (ref 3.5–5.2)
Sodium: 140 mmol/L (ref 134–144)
eGFR: 58 mL/min/{1.73_m2} — ABNORMAL LOW (ref >59)

## 2023-04-18 LAB — CBC
Hematocrit: 44.1 % (ref 34.0–46.6)
Hemoglobin: 14.4 g/dL (ref 11.1–15.9)
MCH: 31.3 pg (ref 26.6–33.0)
MCHC: 32.7 g/dL (ref 31.5–35.7)
MCV: 96 fL (ref 79–97)
Platelets: 143 10*3/uL — ABNORMAL LOW (ref 150–450)
RBC: 4.6 x10E6/uL (ref 3.77–5.28)
RDW: 13.1 % (ref 11.7–15.4)
WBC: 9.2 10*3/uL (ref 3.4–10.8)

## 2023-04-20 ENCOUNTER — Ambulatory Visit: Payer: PPO | Admitting: Physician Assistant

## 2023-04-25 ENCOUNTER — Ambulatory Visit
Admission: RE | Admit: 2023-04-25 | Discharge: 2023-04-25 | Disposition: A | Discharge status: Home / Self Care | Payer: PPO | Attending: Internal Medicine | Admitting: Internal Medicine

## 2023-04-25 ENCOUNTER — Encounter
Admission: RE | Disposition: A | Discharge status: Home / Self Care | Payer: PPO | Source: Home / Self Care | Attending: Internal Medicine

## 2023-04-25 ENCOUNTER — Other Ambulatory Visit: Payer: Self-pay

## 2023-04-25 DIAGNOSIS — I5032 Chronic diastolic (congestive) heart failure: Secondary | ICD-10-CM | POA: Diagnosis not present

## 2023-04-25 DIAGNOSIS — Z6835 Body mass index (BMI) 35.0-35.9, adult: Secondary | ICD-10-CM | POA: Diagnosis not present

## 2023-04-25 DIAGNOSIS — I2510 Atherosclerotic heart disease of native coronary artery without angina pectoris: Secondary | ICD-10-CM | POA: Diagnosis not present

## 2023-04-25 DIAGNOSIS — F419 Anxiety disorder, unspecified: Secondary | ICD-10-CM | POA: Insufficient documentation

## 2023-04-25 DIAGNOSIS — I110 Hypertensive heart disease with heart failure: Secondary | ICD-10-CM | POA: Diagnosis not present

## 2023-04-25 DIAGNOSIS — E785 Hyperlipidemia, unspecified: Secondary | ICD-10-CM | POA: Insufficient documentation

## 2023-04-25 DIAGNOSIS — I7121 Aneurysm of the ascending aorta, without rupture: Secondary | ICD-10-CM

## 2023-04-25 DIAGNOSIS — I2584 Coronary atherosclerosis due to calcified coronary lesion: Secondary | ICD-10-CM | POA: Diagnosis not present

## 2023-04-25 DIAGNOSIS — K5730 Diverticulosis of large intestine without perforation or abscess without bleeding: Secondary | ICD-10-CM | POA: Insufficient documentation

## 2023-04-25 HISTORY — PX: RIGHT HEART CATH AND CORONARY ANGIOGRAPHY: CATH118264

## 2023-04-25 LAB — POCT I-STAT EG7
Acid-Base Excess: 3 mmol/L — ABNORMAL HIGH (ref 0.0–2.0)
Bicarbonate: 28.9 mmol/L — ABNORMAL HIGH (ref 20.0–28.0)
Calcium, Ion: 1.16 mmol/L (ref 1.15–1.40)
HCT: 40 % (ref 36.0–46.0)
Hemoglobin: 13.6 g/dL (ref 12.0–15.0)
O2 Saturation: 63 %
Potassium: 3.7 mmol/L (ref 3.5–5.1)
Sodium: 140 mmol/L (ref 135–145)
TCO2: 30 mmol/L (ref 22–32)
pCO2, Ven: 50.1 mm[Hg] (ref 44–60)
pH, Ven: 7.368 (ref 7.25–7.43)
pO2, Ven: 35 mm[Hg] (ref 32–45)

## 2023-04-25 LAB — POCT I-STAT 7, (LYTES, BLD GAS, ICA,H+H)
Acid-Base Excess: 2 mmol/L (ref 0.0–2.0)
Bicarbonate: 27.5 mmol/L (ref 20.0–28.0)
Calcium, Ion: 1.19 mmol/L (ref 1.15–1.40)
HCT: 41 % (ref 36.0–46.0)
Hemoglobin: 13.9 g/dL (ref 12.0–15.0)
O2 Saturation: 88 %
Potassium: 3.8 mmol/L (ref 3.5–5.1)
Sodium: 139 mmol/L (ref 135–145)
TCO2: 29 mmol/L (ref 22–32)
pCO2 arterial: 45.9 mm[Hg] (ref 32–48)
pH, Arterial: 7.386 (ref 7.35–7.45)
pO2, Arterial: 56 mm[Hg] — ABNORMAL LOW (ref 83–108)

## 2023-04-25 LAB — GLUCOSE, CAPILLARY: Glucose-Capillary: 106 mg/dL — ABNORMAL HIGH (ref 70–99)

## 2023-04-25 SURGERY — RIGHT HEART CATH AND CORONARY ANGIOGRAPHY
Anesthesia: Moderate Sedation | Laterality: Bilateral

## 2023-04-25 MED ORDER — IOHEXOL 300 MG/ML  SOLN
INTRAMUSCULAR | Status: DC | PRN
  Administered 2023-04-25: 34 mL

## 2023-04-25 MED ORDER — HEPARIN (PORCINE) IN NACL 1000-0.9 UT/500ML-% IV SOLN
INTRAVENOUS | Status: AC
Start: 2023-04-25 — End: ?
  Filled 2023-04-25: qty 1000

## 2023-04-25 MED ORDER — SODIUM CHLORIDE 0.9% FLUSH: 3.0000 mL | INTRAVENOUS | Status: DC | PRN

## 2023-04-25 MED ORDER — VERAPAMIL HCL 2.5 MG/ML IV SOLN
INTRAVENOUS | Status: AC
Start: 2023-04-25 — End: ?
  Filled 2023-04-25: qty 2

## 2023-04-25 MED ORDER — ASPIRIN 81 MG PO CHEW: 81.0000 mg | CHEWABLE_TABLET | ORAL | Status: DC

## 2023-04-25 MED ORDER — SODIUM CHLORIDE 0.9% FLUSH: 3.0000 mL | Freq: Two times a day (BID) | INTRAVENOUS | Status: DC

## 2023-04-25 MED ORDER — FENTANYL CITRATE (PF) 100 MCG/2ML IJ SOLN
INTRAMUSCULAR | Status: DC | PRN
  Administered 2023-04-25: 25 ug via INTRAVENOUS

## 2023-04-25 MED ORDER — HYDRALAZINE HCL 20 MG/ML IJ SOLN: 10.0000 mg | INTRAMUSCULAR | Status: DC | PRN

## 2023-04-25 MED ORDER — LABETALOL HCL 5 MG/ML IV SOLN: 10.0000 mg | INTRAVENOUS | Status: DC | PRN

## 2023-04-25 MED ORDER — SODIUM CHLORIDE 0.9 % IV SOLN: 250.0000 mL | INTRAVENOUS | Status: DC | PRN

## 2023-04-25 MED ORDER — HEPARIN SODIUM (PORCINE) 1000 UNIT/ML IJ SOLN
INTRAMUSCULAR | Status: AC
  Filled 2023-04-25: qty 10

## 2023-04-25 MED ORDER — HEPARIN (PORCINE) IN NACL 2000-0.9 UNIT/L-% IV SOLN
INTRAVENOUS | Status: DC | PRN
  Administered 2023-04-25: 1000 mL

## 2023-04-25 MED ORDER — SODIUM CHLORIDE 0.9 % WEIGHT BASED INFUSION
3.0000 mL/kg/h | INTRAVENOUS | Status: AC
  Administered 2023-04-25: 3 mL/kg/h via INTRAVENOUS

## 2023-04-25 MED ORDER — HEPARIN SODIUM (PORCINE) 1000 UNIT/ML IJ SOLN
INTRAMUSCULAR | Status: DC | PRN
  Administered 2023-04-25: 4500 [IU] via INTRAVENOUS

## 2023-04-25 MED ORDER — ONDANSETRON HCL 4 MG/2ML IJ SOLN: 4.0000 mg | Freq: Four times a day (QID) | INTRAMUSCULAR | Status: DC | PRN

## 2023-04-25 MED ORDER — FENTANYL CITRATE (PF) 100 MCG/2ML IJ SOLN
INTRAMUSCULAR | Status: AC
  Filled 2023-04-25: qty 2

## 2023-04-25 MED ORDER — VERAPAMIL HCL 2.5 MG/ML IV SOLN
INTRAVENOUS | Status: DC | PRN
  Administered 2023-04-25: 2.5 mg via INTRAVENOUS

## 2023-04-25 MED ORDER — SODIUM CHLORIDE 0.9 % IV SOLN: INTRAVENOUS | Status: DC

## 2023-04-25 MED ORDER — MIDAZOLAM HCL 2 MG/2ML IJ SOLN
INTRAMUSCULAR | Status: DC | PRN
  Administered 2023-04-25: 1 mg via INTRAVENOUS

## 2023-04-25 MED ORDER — ACETAMINOPHEN 325 MG PO TABS
650.0000 mg | ORAL_TABLET | ORAL | Status: DC | PRN
Start: 2023-04-25 — End: 2023-04-25

## 2023-04-25 MED ORDER — LIDOCAINE HCL 1 % IJ SOLN
INTRAMUSCULAR | Status: AC
  Filled 2023-04-25: qty 20

## 2023-04-25 MED ORDER — LIDOCAINE HCL (PF) 1 % IJ SOLN
INTRAMUSCULAR | Status: DC | PRN
  Administered 2023-04-25 (×2): 2 mL

## 2023-04-25 MED ORDER — ASPIRIN 81 MG PO TBEC: 81.0000 mg | DELAYED_RELEASE_TABLET | Freq: Every day | ORAL | Status: AC

## 2023-04-25 MED ORDER — MIDAZOLAM HCL 2 MG/2ML IJ SOLN
INTRAMUSCULAR | Status: AC
  Filled 2023-04-25: qty 2

## 2023-04-25 MED ORDER — SODIUM CHLORIDE 0.9 % WEIGHT BASED INFUSION
1.0000 mL/kg/h | INTRAVENOUS | Status: DC
  Administered 2023-04-25: 1 mL/kg/h via INTRAVENOUS

## 2023-04-25 SURGICAL SUPPLY — 18 items
CANNULA 5F STIFF (CANNULA) IMPLANT
CATH BALLN WEDGE 5F 110CM (CATHETERS) IMPLANT
CATH INFINITI 5 FR MPA2 (CATHETERS) IMPLANT
CATH INFINITI 5FR JL5 (CATHETERS) IMPLANT
CATH INFINITI JR4 5F (CATHETERS) IMPLANT
DEVICE CLOSURE MYNXGRIP 5F (Vascular Products) IMPLANT
DEVICE RAD TR BAND REGULAR (VASCULAR PRODUCTS) IMPLANT
DRAPE BRACHIAL (DRAPES) IMPLANT
GLIDESHEATH SLEND SS 6F .021 (SHEATH) IMPLANT
GUIDEWIRE .025 260CM (WIRE) IMPLANT
GUIDEWIRE INQWIRE 1.5J.035X260 (WIRE) IMPLANT
INQWIRE 1.5J .035X260CM (WIRE) ×1
PACK CARDIAC CATH (CUSTOM PROCEDURE TRAY) ×1 IMPLANT
PROTECTION STATION PRESSURIZED (MISCELLANEOUS) ×1
SHEATH AVANTI 5FR X 11CM (SHEATH) IMPLANT
SHEATH GLIDE SLENDER 4/5FR (SHEATH) IMPLANT
STATION PROTECTION PRESSURIZED (MISCELLANEOUS) IMPLANT
WIRE GUIDERIGHT .035X150 (WIRE) IMPLANT

## 2023-04-25 NOTE — Discharge Instructions (Addendum)
Radial Site Care Refer to this sheet in the next few weeks. These instructions provide you with information about caring for yourself after your procedure. Your health care provider may also give you more specific instructions. Your treatment has been planned according to current medical practices, but problems sometimes occur. Call your health care provider if you have any problems or questions after your procedure. What can I expect after the procedure? After your procedure, it is typical to have the following: Bruising at the radial site that usually fades within 1-2 weeks. Blood collecting in the tissue (hematoma) that may be painful to the touch. It should usually decrease in size and tenderness within 1-2 weeks.  Follow these instructions at home: Take medicines only as directed by your health care provider. If you are on a medication called Metformin please do not take for 48 hours after your procedure. Over the next 48hrs please increase your fluid intake of water and non caffeine beverages to flush the contrast dye out of your system.  You may shower 24 hours after the procedure  Leave your bandage on and gently wash the site with plain soap and water. Pat the area dry with a clean towel. Do not rub the site, because this may cause bleeding.  Remove your dressing 48hrs after your procedure and leave open to air.  Do not submerge your site in water for 7 days. This includes swimming and washing dishes.  Check your insertion site every day for redness, swelling, or drainage. Do not apply powder or lotion to the site. Do not flex or bend the affected arm for 24 hours or as directed by your health care provider. Do not push or pull heavy objects with the affected arm for 24 hours or as directed by your health care provider. Do not lift over 10 lb (4.5 kg) for 5 days after your procedure or as directed by your health care provider. Ask your health care provider when it is okay to: Return to  work or school. Resume usual physical activities or sports. Resume sexual activity. Do not drive home if you are discharged the same day as the procedure. Have someone else drive you. You may drive 48 hours after the procedure Do not operate machinery or power tools for 24 hours after the procedure. If your procedure was done as an outpatient procedure, which means that you went home the same day as your procedure, a responsible adult should be with you for the first 24 hours after you arrive home. Keep all follow-up visits as directed by your health care provider. This is important. Contact a health care provider if: You have a fever. You have chills. You have increased bleeding from the radial site. Hold pressure on the site. Get help right away if: You have unusual pain at the radial site. You have redness, warmth, or swelling at the radial site. You have drainage (other than a small amount of blood on the dressing) from the radial site. The radial site is bleeding, and the bleeding does not stop after 15 minutes of holding steady pressure on the site. Your arm or hand becomes pale, cool, tingly, or numb. This information is not intended to replace advice given to you by your health care provider. Make sure you discuss any questions you have with your health care provider. Document Released: 04/15/2010 Document Revised: 08/19/2015 Document Reviewed: 09/29/2013 Elsevier Interactive Patient Education  2018 [employer]      Femoral Site Care Refer to  this sheet in the next few weeks. These instructions provide you with information about caring for yourself after your procedure. Your health care provider may also give you more specific instructions. Your treatment has been planned according to current medical practices, but problems sometimes occur. Call your health care provider if you have any problems or questions after your procedure. What can I expect after the procedure? After  your procedure, it is typical to have the following: Bruising at the site that usually fades within 1-2 weeks. Blood collecting in the tissue (hematoma) that may be painful to the touch. It should usually decrease in size and tenderness within 1-2 weeks.  Follow these instructions at home:  Take medicines only as directed by your health care provider.  If you take Metformin, hold for 48hours after your procedure.  The x-ray dye causes you to pass a considerate amount of urine.  For this reason, you will be asked to drink plenty of liquids after the procedure to prevent dehydration.  You may resume you regular diet.  Avoid caffeine products.   You may shower 24 hours after procedure. Leave the bandage on your access site for.  You may wash around your dressing but do not rub the site, this may cause bleeding.  Pat the area dry with a clean towel. After 48hours remove bandage and leave open to air. Do not take baths, swim, or use a hot tub for 7 days. Check your insertion site every day for redness, swelling, or drainage. If you lose feeling or develop tingling or pain in your leg or foot after the procedure, please walk around first.  If the discomfort does not improve , contact your physician and proceed to the nearest emergency room.  Loss of feeling in your leg might mean that a blockage has formed in the artery and this can be appropriately treated.  Limit your activity for the next two days after your procedure.  Avoid stooping, bending, heavy lifting or exertion as this may put pressure on the insertion site.  Resume normal activities in 48 hours.   check the insertion site occasionally.  If any oozing occurs or there is apparent swelling, firm pressure over the site will prevent a bruise from forming.  You can not hurt anything by pressing directly on the site.  The pressure stops the bleeding by allowing a small clot to form.  If the bleeding continues after the pressure has been applied for more  than 15 minutes, call 911 or go to the nearest emergency room.    Apply pressure to access site if you have to laugh, cough, or sneeze. Do not apply powder or lotion to the site. Limit use of stairs to twice a day for the first 2-3 days or as directed by your health care provider. Do not squat for the first 2-3 days or as directed by your health care provider. Do not lift over 10 lb (4.5 kg) for 5 days after your procedure or as directed by your health care provider. Ask your health care provider when it is okay to: Return to work or school. Resume usual physical activities or sports. Resume sexual activity. Do not drive home if you are discharged the same day as the procedure. Have someone else drive you. You may drive 48 hours after the procedure unless otherwise instructed by your health care provider. Do not operate machinery or power tools for 24 hours after the procedure or as directed by your health care provider.  If your procedure was done as an outpatient procedure, which means that you went home the same day as your procedure, a responsible adult should be with you for the first 24 hours after you arrive home. Keep all follow-up visits as directed by your health care provider. This is important. Contact a health care provider if: You have a fever. You have chills. You have increased bleeding from the site. Hold pressure on the site. Get help right away if: You have unusual pain at the site. You have redness, warmth, or swelling at the site. You have drainage (other than a small amount of blood on the dressing) from the site. The site is bleeding, and the bleeding does not stop after 30 minutes of holding steady pressure on the site. Your leg or foot becomes pale, cool, tingly, or numb. This information is not intended to replace advice given to you by your health care provider. Make sure you discuss any questions you have with your health care provider. Document Released:  11/14/2013 Document Revised: 08/19/2015 Document Reviewed: 09/30/2013 Elsevier Interactive Patient Education  [employer].

## 2023-04-25 NOTE — Interval H&P Note (Signed)
History and Physical Interval Note:  04/25/2023 11:57 AM  Latoya Cordova  has presented today for surgery, with the diagnosis of thoracic aortic aneurysm.  The various methods of treatment have been discussed with the patient and family. After consideration of risks, benefits and other options for treatment, the patient has consented to  Procedure(s): RIGHT/LEFT HEART CATH AND CORONARY ANGIOGRAPHY (Bilateral) as a surgical intervention.  The patient's history has been reviewed, patient examined, no change in status, stable for surgery.  I have reviewed the patient's chart and labs.  Questions were answered to the patient's satisfaction.    Cath Lab Visit (complete for each Cath Lab visit)  Clinical Evaluation Leading to the Procedure:   ACS: No.  Non-ACS:    Anginal Classification: No Symptoms  Anti-ischemic medical therapy: No Therapy  Non-Invasive Test Results: No non-invasive testing performed  Prior CABG: No previous CABG  [name]

## 2023-04-26 ENCOUNTER — Encounter: Payer: Self-pay | Admitting: Internal Medicine

## 2023-05-01 ENCOUNTER — Institutional Professional Consult (permissible substitution): Payer: PPO | Admitting: Surgery

## 2023-05-01 ENCOUNTER — Encounter: Payer: Self-pay | Admitting: Surgery

## 2023-05-01 ENCOUNTER — Other Ambulatory Visit: Payer: Self-pay | Admitting: Surgery

## 2023-05-01 VITALS — BP 134/68 | HR 106 | Resp 20 | Ht 64.0 in | Wt 207.0 lb

## 2023-05-01 DIAGNOSIS — I7121 Aneurysm of the ascending aorta, without rupture: Secondary | ICD-10-CM | POA: Diagnosis not present

## 2023-05-02 NOTE — Progress Notes (Signed)
 Cardiothoracic Surgery Consultation  PCP is Bertrum Charlie CROME, MD Referring Provider is Darliss Rogue, MD  Chief Complaint  Patient presents with   Thoracic Aortic Aneurysm    New patient consultation, CTA chest/abd/pel 1/14    HPI:  This patient is a 78 year old woman with history of hypertension, hyperlipidemia, morbid obesity, and HFpEF who was recently diagnosed with a 6.4 cm ascending aortic aneurysm by CTA in January 2025.  The scan also showed a 3.9 cm abdominal aortic aneurysm.  She had an echo in June 2016 showing ejection fraction of 50 to 55% with mild mitral regurgitation.  She was admitted to the hospital in January 2017 with acute respiratory distress felt to be secondary to viral pneumonitis or pneumonia.  An echocardiogram at that time showed an ejection fraction of 60 to 65% with moderate concentric LVH and grade 1 diastolic dysfunction.  A CT PE study at that time did not comment on her aorta but I reviewed the study and personally measured the aorta and I got a measurement of 4.3-4.4 cm in the mid ascending aorta.  There was an LVOT gradient measured at 41 mmHg with Valsalva although it was not felt to be well-visualized.  The ascending aorta was mildly dilated at 3.7 cm.  She has been followed by cardiology with symptoms of shortness of breath for years.  She had a nuclear stress test on 03/01/2020 which showed no evidence of ischemia.  Review of the study shows the ascending aorta to measure 5-5.5 cm at that time.  She is here today with her son. She is widowed and lives with her son. Her husband had two CABG surgeries and died of an MI in his 54's.  She has had exertional shortness of breath for years which occurs even with walking around in her house.  She has 13 steps to lock up at the back of her house and has a hard time doing that.  When she goes to the grocery store she has to take breaks to sit down.  She has SOB at rest sometimes with talking.She denies any chest  pain or pressure.  He reports some mild left and right upper chest discomfort that she has had for many years and resolves with rubbing her chest and sounds musculoskeletal.  She has had no dizziness or syncope.  She has had some lower extremity edema. Past Medical History:  Diagnosis Date   Anxiety    Diastolic dysfunction    a. echo 08/2014: EF 50-55%, mild MR; b. echo 03/2015: EF 60-65%, RWMA could not be excluded, GR1DD, ascending aorta mildly dilated at 37mm, normal right-sided pressure   Hyperlipidemia    Hypertension    Morbid obesity (HCC)     Past Surgical History:  Procedure Laterality Date   ABDOMINAL HYSTERECTOMY  08/18/2004   APPENDECTOMY     CATARACT EXTRACTION Bilateral    RIGHT HEART CATH AND CORONARY ANGIOGRAPHY Bilateral 04/25/2023   Procedure: RIGHT HEART CATH AND CORONARY ANGIOGRAPHY;  Surgeon: Mady Bruckner, MD;  Location: [facility]IVE [facility];  Service: Cardiovascular;  Laterality: Bilateral;    Family History  Problem Relation Age of Onset   Ulcers Father    Heart attack Paternal Grandfather    Hypertension Mother    Congestive Heart Failure Mother    Breast cancer Maternal Aunt    Breast cancer Cousin     Social History Social History   Tobacco Use   Smoking status: Never   Smokeless tobacco: Never  Vaping  Use   Vaping status: Never Used  Substance Use Topics   Alcohol use: No   Drug use: No    Current Outpatient Medications  Medication Sig Dispense Refill   amLODipine  (NORVASC ) 5 MG tablet Take 1 tablet (5 mg total) by mouth daily. 90 tablet 2   aspirin  EC 81 MG tablet Take 1 tablet (81 mg total) by mouth daily. Swallow whole.     brimonidine (ALPHAGAN) 0.2 % ophthalmic solution Place 1 drop into both eyes 2 (two) times daily.     buPROPion  (WELLBUTRIN  XL) 300 MG 24 hr tablet Take 300 mg by mouth daily.     butalbital -aspirin -caffeine  (FIORINAL ) 50-325-40 MG capsule Take 1 capsule by mouth 2 (two) times daily as needed for headache. 50 capsule  2   Cholecalciferol (VITAMIN D ) 50 MCG (2000 UT) CAPS Take 2,000 Units by mouth daily.     citalopram (CELEXA) 10 MG tablet Take 10 mg by mouth daily.     furosemide  (LASIX ) 20 MG tablet TAKE 1 TABLET(20 MG) BY MOUTH EVERY MORNING 30 tablet 11   hydrochlorothiazide  (HYDRODIURIL ) 25 MG tablet Take 25 mg by mouth daily.     HYDROcodone -acetaminophen  (NORCO/VICODIN) 5-325 MG tablet Take 1-2 tablets by mouth every 6 (six) hours as needed for moderate pain. 50 tablet 0   levothyroxine  (SYNTHROID ) 25 MCG tablet Take 1 tablet (25 mcg total) by mouth daily. 90 tablet 1   LORazepam  (ATIVAN ) 0.5 MG tablet TAKE ONE TABLET BY MOUTH TWICE A DAY AS NEEDED (Patient taking differently: Take 0.5 mg by mouth daily. Can take an additional dose if needed for anxiety) 30 tablet 3   losartan (COZAAR) 50 MG tablet Take 50 mg by mouth daily.     PARoxetine  (PAXIL ) 40 MG tablet TAKE ONE TABLET BY MOUTH ONE TIME DAILY **MUST CALL DR. FOR APPOINTMENT FOR FURTHER REFILLS** 30 tablet 0   rosuvastatin  (CRESTOR ) 10 MG tablet TAKE ONE TABLET BY MOUTH ONE TIME DAILY 30 tablet 11   No current facility-administered medications for this visit.    Allergies  Allergen Reactions   Sulfa Antibiotics     Per Mother   Lodine  [Etodolac] Rash    Abdominal rash.    Review of Systems  Constitutional:  Positive for activity change and fatigue.  HENT:  Positive for hearing loss.   Eyes: Negative.   Respiratory:  Positive for cough, shortness of breath and wheezing.   Cardiovascular:  Positive for leg swelling. Negative for chest pain.  Gastrointestinal: Negative.   Endocrine: Negative.   Genitourinary:  Positive for frequency.  Musculoskeletal: Negative.   Skin: Negative.   Allergic/Immunologic: Negative.   Neurological:  Positive for headaches. Negative for dizziness and syncope.  Hematological:  Bruises/bleeds easily.  Psychiatric/Behavioral:         Anxiety, depression, nervousness and unusual stress recently.    BP  134/68 (BP Location: Right Arm, Patient Position: Sitting, Cuff Size: Normal)   Pulse (!) 106   Resp 20   Ht 5' 4 (1.626 m)   Wt 207 lb (93.9 kg)   SpO2 91% Comment: RA  BMI 35.53 kg/m  Physical Exam Constitutional:      Appearance: Normal appearance. She is obese.  HENT:     Head: Normocephalic and atraumatic.  Eyes:     Extraocular Movements: Extraocular movements intact.     Conjunctiva/sclera: Conjunctivae normal.     Pupils: Pupils are equal, round, and reactive to light.  Neck:     Vascular: No carotid bruit.  Cardiovascular:     Rate and Rhythm: Normal rate and regular rhythm.     Heart sounds: Normal heart sounds. No murmur heard. Pulmonary:     Effort: Pulmonary effort is normal.     Comments: Decreased breath sounds throughout Abdominal:     General: Bowel sounds are normal.     Comments: Protuberant abdomen.  Musculoskeletal:        General: No swelling.     Comments: Curvature of distal sternum and xyphoid   Skin:    General: Skin is warm and dry.  Neurological:     General: No focal deficit present.     Mental Status: She is alert and oriented to person, place, and time.  Psychiatric:        Mood and Affect: Mood normal.        Behavior: Behavior normal.      Diagnostic Tests:  Narrative & Impression  CLINICAL DATA:  Thoracic aortic aneurysm   EXAM: CT ANGIOGRAPHY CHEST, ABDOMEN AND PELVIS   TECHNIQUE: Non-contrast CT of the chest was initially obtained.   Multidetector CT imaging through the chest, abdomen and pelvis was performed using the standard protocol during bolus administration of intravenous contrast. Multiplanar reconstructed images and MIPs were obtained and reviewed to evaluate the vascular anatomy.   RADIATION DOSE REDUCTION: This exam was performed according to the departmental dose-optimization program which includes automated exposure control, adjustment of the mA and/or kV according to patient size and/or use of iterative  reconstruction technique.   CONTRAST:  OMNIPAQUE  IOHEXOL  350 MG/ML SOLN   COMPARISON:  04/08/2015   FINDINGS: CTA CHEST FINDINGS   Cardiovascular: There is an ascending thoracic aortic aneurysm measuring up to 6.4 cm in maximal diameter. The aortic arch measures up to 3.5 cm in diameter, and the descending thoracic aorta measures up to 3.9 cm in diameter at the diaphragmatic hiatus. No evidence of thoracic aortic dissection.   There is mild cardiomegaly, with calcifications of the mitral and aortic valves. Is technically adequate opacification of the pulmonary vasculature. No filling defects or pulmonary emboli. Atherosclerosis of the aorta and coronary vasculature.   Mediastinum/Nodes: No enlarged mediastinal, hilar, or axillary lymph nodes. Thyroid  gland, trachea, and esophagus demonstrate no significant findings.   Lungs/Pleura: No acute airspace disease, effusion, or pneumothorax. Central airways are patent.   Musculoskeletal: Pectus excavatum deformity again noted. No acute or destructive bony abnormalities. Reconstructed images demonstrate no additional findings.   Review of the MIP images confirms the above findings.   CTA ABDOMEN AND PELVIS FINDINGS   VASCULAR   Aorta: The suprarenal abdominal aorta measures up to 3.9 cm in maximal diameter at the diaphragmatic hiatus. No dissection, vasculitis, or significant stenosis. Diffuse atherosclerosis.   Celiac: Patent without evidence of aneurysm, dissection, vasculitis or significant stenosis.   SMA: Patent without evidence of aneurysm, dissection, vasculitis or significant stenosis.   Renals: Both renal arteries are patent without evidence of aneurysm, dissection, vasculitis, fibromuscular dysplasia or significant stenosis.   IMA: Patent without evidence of aneurysm, dissection, vasculitis or significant stenosis.   Inflow: Patent without evidence of aneurysm, dissection, vasculitis or significant  stenosis.   Veins: No obvious venous abnormality within the limitations of this arterial phase study.   Review of the MIP images confirms the above findings.   NON-VASCULAR   Hepatobiliary: No focal liver abnormality is seen. No gallstones, gallbladder wall thickening, or biliary dilatation.   Pancreas: Unremarkable. No pancreatic ductal dilatation or surrounding inflammatory changes.   Spleen:  Normal in size without focal abnormality.   Adrenals/Urinary Tract: Adrenal glands are unremarkable. Kidneys are normal, without renal calculi, focal lesion, or hydronephrosis. Bladder is unremarkable.   Stomach/Bowel: No bowel obstruction or ileus. Diffuse colonic diverticulosis without evidence of diverticulitis. No bowel wall thickening or inflammatory change.   Lymphatic: No pathologic adenopathy.   Reproductive: Status post hysterectomy. No adnexal masses.   Other: No free fluid or free intraperitoneal gas. No abdominal wall hernia.   Musculoskeletal: No acute or destructive bony abnormalities. Reconstructed images demonstrate no additional findings.   Review of the MIP images confirms the above findings.   IMPRESSION: Vascular:   1. Ascending thoracic aortic aneurysm measuring up to 6.4 cm in maximal diameter. Recommend cardiothoracic/vascular surgery referral if not already obtained. This recommendation follows 2010 ACCF/AHA/AATS/ACR/ASA/SCA/SCAI/SIR/STS/SVM Guidelines for the Diagnosis and Management of Patients With Thoracic Aortic Disease. Circulation. 2010; 121: E266-e369TAA. Aortic aneurysm NOS (ICD10-I71.9) Ascending thoracic aortic aneurysm. 2. Abdominal aortic aneurysm measuring 3.9 cm. Recommend surveillance ultrasound in 3 years. Reference: Journal of Vascular Surgery 67.1 (2018): 2-77. J Am Coll Radiol 2013;10:789-794. 3.  Aortic Atherosclerosis (ICD10-I70.0).   Nonvascular:   1. Colonic diverticulosis without diverticulitis. 2. Cardiomegaly.   These  results will be called to the ordering clinician or representative by the Radiologist Assistant, and communication documented in the PACS or [employer].     Electronically Signed   By: Ozell Daring M.D.   On: 04/14/2023 21:24       ECHOCARDIOGRAM REPORT       Patient Name:   Delorse K Bastyr Date of Exam: 03/27/2023  Medical Rec #:  [id number]       Height:       63.0 in  Accession #:    [id number]      Weight:       205.6 lb  Date of Birth:  [birth year]       BSA:          1.956 m  Patient Age:    63 years        BP:           110/60 mmHg  Patient Gender: F               HR:           82 bpm.  Exam Location:     Procedure: 2D Echo, Cardiac Doppler, Color Doppler and Intracardiac             Opacification Agent   Indications:    R06.02 SOB; I50.20* Unspecified systolic (congestive)  heart                 failure    History:        Patient has prior history of Echocardiogram examinations,  most                 recent 02/11/2020. CHF, Arrythmias:Tachycardia,                  Signs/Symptoms:Shortness of Breath; Risk  Factors:Dyslipidemia                 and Non-Smoker.    Sonographer:    Arley Pac RDMS, RVT, RDCS  Referring Phys: [id number] BERNARDINO CHRISTELLA BRING     Sonographer Comments: DOD consulted regarding aorta caliber before  releasing patient  IMPRESSIONS     1. Left ventricular ejection fraction, by estimation, is 60 to 65%. The  left ventricle has normal function. The left  ventricle has no regional  wall motion abnormalities. Left ventricular diastolic parameters are  consistent with Grade I diastolic  dysfunction (impaired relaxation).   2. Right ventricular systolic function is normal. The right ventricular  size is not well visualized.   3. The mitral valve is grossly normal. No evidence of mitral valve  regurgitation.   4. The aortic valve was not well visualized. Aortic valve regurgitation  is mild.   5. Aortic dilatation noted. There is severe  dilatation of the ascending  aorta, measuring 58 mm. There is borderline dilatation of the aortic root,  measuring 38 mm.   Conclusion(s)/Recommendation(s): Severe ascending aorta dilation, 58 mm in  diameter. Urgent referal to CT surgery placed. CT angio chest and abdomen  to evaluate aorta also placed.   FINDINGS   Left Ventricle: Left ventricular ejection fraction, by estimation, is 60  to 65%. The left ventricle has normal function. The left ventricle has no  regional wall motion abnormalities. Definity  contrast agent was given IV  to delineate the left ventricular   endocardial borders. The left ventricular internal cavity size was normal  in size. Suboptimal image quality limits for assessment of left  ventricular hypertrophy. Left ventricular diastolic parameters are  consistent with Grade I diastolic dysfunction  (impaired relaxation).   Right Ventricle: The right ventricular size is not well visualized. No  increase in right ventricular wall thickness. Right ventricular systolic  function is normal.   Left Atrium: Left atrial size was normal in size.   Right Atrium: Right atrial size was normal in size.   Pericardium: There is no evidence of pericardial effusion.   Mitral Valve: The mitral valve is grossly normal. No evidence of mitral  valve regurgitation.   Tricuspid Valve: The tricuspid valve is normal in structure. Tricuspid  valve regurgitation is mild.   Aortic Valve: The aortic valve was not well visualized. Aortic valve  regurgitation is mild. Aortic valve mean gradient measures 8.0 mmHg.  Aortic valve peak gradient measures 15.1 mmHg. Aortic valve area, by VTI  measures 2.24 cm.   Pulmonic Valve: The pulmonic valve was not well visualized. Pulmonic valve  regurgitation is not visualized.   Aorta: Aortic dilatation noted. There is severe dilatation of the  ascending aorta, measuring 58 mm. There is borderline dilatation of the  aortic root, measuring 38  mm.   Venous: The inferior vena cava was not well visualized.   IAS/Shunts: No atrial level shunt detected by color flow Doppler.     LEFT VENTRICLE  PLAX 2D  LVOT diam:     2.00 cm   Diastology  LV SV:         75        LV e' medial:    8.27 cm/s  LV SV Index:   38        LV E/e' medial:  13.9  LVOT Area:     3.14 cm  LV e' lateral:   11.60 cm/s                           LV E/e' lateral: 9.9     RIGHT VENTRICLE             IVC  RV S prime:     11.60 cm/s  IVC diam: 1.90 cm  TAPSE (M-mode): 2.8 cm   LEFT ATRIUM           Index  LA diam:  3.60 cm 1.84 cm/m  LA Vol (A2C): 99.9 ml 51.06 ml/m   AORTIC VALVE                     PULMONIC VALVE  AV Area (Vmax):    2.11 cm      PV Vmax:       0.95 m/s  AV Area (Vmean):   2.10 cm      PV Peak grad:  3.6 mmHg  AV Area (VTI):     2.24 cm  AV Vmax:           194.00 cm/s  AV Vmean:          128.000 cm/s  AV VTI:            0.336 m  AV Peak Grad:      15.1 mmHg  AV Mean Grad:      8.0 mmHg  LVOT Vmax:         130.50 cm/s  LVOT Vmean:        85.550 cm/s  LVOT VTI:          0.240 m  LVOT/AV VTI ratio: 0.71    AORTA  Ao Root diam: 3.80 cm  Ao Asc diam:  5.60 cm  Ao Arch diam: 3.8 cm   MITRAL VALVE                TRICUSPID VALVE  MV Area (PHT): 3.77 cm     TR Peak grad:   34.8 mmHg  MV Decel Time: 201 msec     TR Vmax:        295.00 cm/s  MV E velocity: 115.00 cm/s  MV A velocity: 143.00 cm/s  SHUNTS  MV E/A ratio:  0.80         Systemic VTI:  0.24 m                              Systemic Diam: 2.00 cm   Redell Cave MD  Electronically signed by Redell Cave MD  Signature Date/Time: 03/27/2023/1:04:01 PM        Final      Physicians  Panel Physicians Referring Physician Case Authorizing Physician  End, Lonni, MD (Primary)     Procedures  RIGHT HEART CATH AND CORONARY ANGIOGRAPHY   Conclusion  Conclusions: Mild to moderate, nonobstructive coronary artery disease with up to 40-50% mid LAD  stenosis and 20% disease involving proximal segment of large ramus intermedius branch.  LCx and RCA are without significant disease. Upper normal left heart filling pressure (PCWP 17 mmHg). Mildly elevated right heart and pulmonary artery pressures (mean RA/RV EDP 10 mmHg, PA 40/15 mmHg, mean PA 23 mmHg). Normal Fick cardiac output/index (CO 5.7 L/min, CI 2.9 L/min/m). Tortuous right radial artery precluding catheterization from this approach.  Query vascular malformation versus IV fistula from remote IV placement in the antecubital fossa based on sheath angiogram and physical exam.  Alternative access recommended for future catheterizations if necessary.   Recommendations: Continue secondary prevention of ASCVD. Proceed with cardiac surgery evaluation for further management of severely dilated ascending aorta. Consider nonemergent duplex of right radial artery/antecubital fossa.   Lonni Hanson, MD [facility]     Recommendations  Antiplatelet/Anticoag Recommend Aspirin  81mg  daily for moderate CAD.  Discharge Date In the absence of any other complications or medical issues, we expect the patient to be ready for discharge from a cath perspective  on 04/25/2023.   Indications  Aneurysm of ascending aorta without rupture (HCC) [I71.21 (ICD-10-CM)]   Clinical Presentation  CHF/Shock Congestive heart failure not present. No shock present.   Procedural Details  Technical Details Indication: 78 y.o. year-old woman with history of HFpEF, recently diagnosed severe ascending thoracic aortic aneurysm measuring 6.4 cm by CTA in 03/2023, AAA measuring 3.9 cm in 03/2023, HTN, HLD, obesity, and anxiety, referred for preoperative cardiac catheterization in anticipation of ascending aortic aneurysm repair.  GFR: >60 ml/min  Procedure: The risks, benefits, complications, treatment options, and expected outcomes were discussed with the patient.  The patient and/or family concurred with the proposed  plan, giving informed consent.  The right wrist and elbow were prepped and draped in a sterile fashion.  1% lidocaine  was used for local anesthesia.  Ultrasound was used to evaluate the right basilic vein. It was patent.  A micropuncture needle was used to access the right basilic vein under ultrasound guidance. A 59F slender Glidesheath was inserted using modified Seldinger technique. Right heart catheterization was performed by advancing a 59F balloon-tipped catheter through the right heart chambers into the pulmonary capillary wedge position. Pressure measurements and oxygen saturations were obtained.  Ultrasound was used to evaluate the right radial artery. It was patent.  A micropuncture needle was used to access the right radial artery under ultrasound guidance.  Using the modified Seldinger access technique, a 32F slender Glidesheath was placed in the right radial artery.  2.5 mg Verapamil  was given through the sheath.  Heparin  4,500 units were administered.  I was unable to advance wires beyond the proximal forearm.  Sheath angiogram showed significant tortuosity of the right radial artery.  Decision was therefore made to switch to femoral access.  Right groin was previously prepped and draped in sterile fashion.  1% lidocaine  was used for local anesthesia.  Ultrasound was used to evaluate the right common femoral artery. It was patent.  A micropuncture needle was used to access the right common femoral artery under ultrasound guidance.  Appropriate placement was confirmed with angiogram through the micropuncture sheath.  Using modified Seldinger technique, a 59F sheath was placed in the right common femoral artery.  Selective coronary angiography was performed using a 59F MPA2 catheter to engage the left coronary artery and a 59F JR4 catheter to engage the right coronary artery.  The aortic valve was not crossed.  At the end of the procedure, the radial artery sheath was removed and a TR band band applied  to achieve patent hemostasis.  The right femoral artery sheath was removed and hemostasis achieved with deployment of a Mynx device.  The basilic vein sheath was removed and hemostasis achieved with manual compression.  There were no immediate complications.  The patient was taken to the recovery area in stable condition.   Estimated blood loss <50 mL.   During this procedure medications were administered to achieve and maintain moderate conscious sedation while the patient's heart rate, blood pressure, and oxygen saturation were continuously monitored and I was present face-to-face 100% of this time. Lynwood Milling RN and Katlyn Kiger Cardiovascular Specialist are independent, trained observers who assisted in the monitoring of the patient's level of consciousness.   Medications (Filter: Administrations occurring from 1154 to 1307 on 04/25/23)  important  Continuous medications are totaled by the amount administered until 04/25/23 1307.   Heparin  (Porcine) in NaCl 2000-0.9 UNIT/L-% SOLN (mL)  Total volume: 1,000 mL Date/Time Rate/Dose/Volume Action   04/25/23 1155 1,000 mL Given  midazolam  (VERSED ) injection (mg)  Total dose: 1 mg Date/Time Rate/Dose/Volume Action   04/25/23 1157 1 mg Given   fentaNYL  (SUBLIMAZE ) injection (mcg)  Total dose: 25 mcg Date/Time Rate/Dose/Volume Action   04/25/23 1157 25 mcg Given   lidocaine  (PF) (XYLOCAINE ) 1 % injection (mL)  Total volume: 4 mL Date/Time Rate/Dose/Volume Action   04/25/23 1210 2 mL Given   1226 2 mL Given   verapamil  (ISOPTIN ) injection (mg)  Total dose: 2.5 mg Date/Time Rate/Dose/Volume Action   04/25/23 1218 2.5 mg Given   heparin  sodium (porcine) injection (Units)  Total dose: 4,500 Units Date/Time Rate/Dose/Volume Action   04/25/23 1230 4,500 Units Given   iohexol  (OMNIPAQUE ) 300 MG/ML solution (mL)  Total volume: 34 mL Date/Time Rate/Dose/Volume Action   04/25/23 1248 34 mL Given    Sedation Time  Sedation Time  Physician-1: 45 minutes 1 second Contrast     Administrations occurring from 1154 to 1307 on 04/25/23:  Medication Name Total Dose  iohexol  (OMNIPAQUE ) 300 MG/ML solution 34 mL   Radiation/Fluoro  Fluoro time: 9.6 (min) DAP: 33.2 (Gycm2) Cumulative Air Kerma: 405 (mGy) Complications  Complications documented before study signed (04/25/2023  3:44 PM)   No complications were associated with this study.  Documented by Kiger, Katlyn, RT - 04/25/2023 12:48 PM     Coronary Findings  Diagnostic Dominance: Right Left Main  Vessel is large. Vessel is angiographically normal.    Left Anterior Descending  Vessel is moderate in size. There is mild diffuse disease throughout the vessel.  Mid LAD lesion is 45% stenosed. The lesion is irregular. The lesion is calcified.    Ramus Intermedius  Vessel is large.  Ramus lesion is 20% stenosed.    Left Circumflex  Vessel is large.    First Obtuse Marginal Branch  Vessel is moderate in size.    Second Obtuse Marginal Branch  Vessel is moderate in size.    Third Obtuse Marginal Branch  Vessel is small in size.    Fourth Obtuse Marginal Branch  Vessel is small in size.    Right Coronary Artery  Vessel is large. Vessel is angiographically normal.    Right Posterior Descending Artery  Vessel is moderate in size.    Right Posterior Atrioventricular Artery  Vessel is moderate in size.    First Right Posterolateral Branch  Vessel is small in size.    Second Right Posterolateral Branch  Vessel is small in size.    Third Right Posterolateral Branch  Vessel is small in size.    Intervention   No interventions have been documented.   Right Heart  Right Heart Pressures RA (mean): 10 mmHg RV (S/EDP): 40/10 mmHg PA (S/D, mean): 40/15 (23) mmHg PCWP (mean): 17 mmHg  Ao sat: 88% PA sat: 63%  Fick CO: 5.7 L/min Fick CI: 2.9 L/min/m^2   Coronary Diagrams  Diagnostic Dominance: Right  Intervention   Implants    Vascular Products  Device Closure Mynxgrip 62f - [id number] - Implanted  Inventory item: DEVICE CLOSURE MYNXGRIP 71F Model/Cat number: FK4978  Manufacturer: [employer] Lot number: [id number]  Device identifier: [id number] Device identifier type: GS1  GUDID Information  Request status Successful    Brand name: [facility] Version/Model: FK4978  Company name: [employer] MRI safety info as of 04/25/23: MR Safe  Contains dry or latex rubber: No    GMDN P.T. name: Wound hydrogel dressing, non-antimicrobial    As of 04/25/2023  Status: Implanted     Syngo Images  Show images for CARDIAC CATHETERIZATION Images on Long Term Storage   Show images for Korus, Bellamarie K Link to Procedure Log  Procedure Log    Hemo Data  Flowsheet Row Most Recent Value  Fick Cardiac Output 5.71 L/min  Fick Cardiac Output Index 2.88 (L/min)/BSA  RA A Wave 11 mmHg  RA V Wave 10 mmHg  RA Mean 8 mmHg  RV Systolic Pressure 40 mmHg  RV Diastolic Pressure 0 mmHg  RV EDP 11 mmHg  PA Systolic Pressure 35 mmHg  PA Diastolic Pressure 9 mmHg  PA Mean 21 mmHg  PW A Wave 12 mmHg  PW V Wave 10 mmHg  PW Mean 9 mmHg  AO Systolic Pressure 107 mmHg  AO Diastolic Pressure 53 mmHg  AO Mean 76 mmHg  QP/QS 1  TPVR Index 7.3 HRUI  TSVR Index 26.43 HRUI  PVR SVR Ratio 0.18  TPVR/TSVR Ratio 0.28   Impression:  This 78 year old woman has a 6.4 cm fusiform ascending aortic aneurysm extending into the aortic arch.  Review of her previous study showed that it appeared to be about 4.3-4.4 cm in 2017 by CT PE study increasing to 5-5.5 cm on nuclear stress test in 2021 and now 6.4 cm by CTA.  I have personally reviewed her study and the aortic root appears to be of normal size but the aneurysm begins at the sinotubular junction and extends up into the mid aortic arch.  The aorta is still 5 cm at the takeoff of the innominate artery and does not decrease to normal size until beyond the left subclavian  artery.  While her 6.4 cm diameter is above the surgical threshold of 5.5 cm, surgical treatment in this patient would require replacement of the ascending aorta and aortic arch to beyond the left subclavian artery with bypasses to the arch vessels with deep hypothermic circulatory arrest.  It may also require replacement of her aortic root if the tissues at her sinotubular junction are too weak for supra coronary anastomosis. I think her risk for surgical repair of this aneurysm may be prohibitive due to her advanced age, the magnitude of surgical repair required since this aneurysm extends out into the aortic arch, and her chronic shortness of breath of undetermined etiology with resting hypoxemia.  Her arterial oxygen saturation at catheterization was only 88% and her room air arterial saturation today is 91%.  She has chronic shortness of breath with minimal exertion such as walking around her house and has to stop frequently to sit down and rest with any type of activity.  This is likely due to a combination of her body habitus and some intrinsic lung disease.  I reviewed all of her studies with her and her son and discussed surgical repair.  I also discussed my concerns about her operative risk.  I think she should have full pulmonary function testing with diffusion capacity to get a better idea about her intrinsic lung disease. I stressed the importance of continued good blood pressure control in preventing further enlargement and acute aortic dissection.  I advised her against doing any heavy lifting that may require a Valsalva maneuver and could suddenly raise his blood pressure to high levels.   Plan:  We will obtain pulmonary function testing with diffusion capacity and I will see her back after that to review the results with her and her son and discuss surgical treatment further.   I spent 60 minutes performing this consultation and > 50% of this time was spent  face to face counseling and  coordinating the care of this patient's ascending aortic aneurysm.  Latoya MARLA Fellers, MD [facility] [phone]

## 2023-05-08 ENCOUNTER — Ambulatory Visit: Payer: PPO | Attending: Physician Assistant | Admitting: Physician Assistant

## 2023-05-08 ENCOUNTER — Ambulatory Visit (HOSPITAL_COMMUNITY)
Admission: RE | Admit: 2023-05-08 | Discharge: 2023-05-08 | Disposition: A | Discharge status: Home / Self Care | Payer: PPO | Source: Ambulatory Visit | Attending: Surgery | Admitting: Surgery

## 2023-05-08 ENCOUNTER — Encounter: Payer: Self-pay | Admitting: Physician Assistant

## 2023-05-08 VITALS — BP 130/74 | HR 81 | Ht 64.0 in | Wt 212.4 lb

## 2023-05-08 DIAGNOSIS — J984 Other disorders of lung: Secondary | ICD-10-CM | POA: Insufficient documentation

## 2023-05-08 DIAGNOSIS — E785 Hyperlipidemia, unspecified: Secondary | ICD-10-CM

## 2023-05-08 DIAGNOSIS — Z01811 Encounter for preprocedural respiratory examination: Secondary | ICD-10-CM | POA: Diagnosis not present

## 2023-05-08 DIAGNOSIS — I714 Abdominal aortic aneurysm, without rupture, unspecified: Secondary | ICD-10-CM | POA: Diagnosis not present

## 2023-05-08 DIAGNOSIS — I7121 Aneurysm of the ascending aorta, without rupture: Secondary | ICD-10-CM | POA: Insufficient documentation

## 2023-05-08 DIAGNOSIS — I10 Essential (primary) hypertension: Secondary | ICD-10-CM

## 2023-05-08 DIAGNOSIS — F40240 Claustrophobia: Secondary | ICD-10-CM | POA: Diagnosis not present

## 2023-05-08 DIAGNOSIS — I5032 Chronic diastolic (congestive) heart failure: Secondary | ICD-10-CM

## 2023-05-08 DIAGNOSIS — I2510 Atherosclerotic heart disease of native coronary artery without angina pectoris: Secondary | ICD-10-CM | POA: Diagnosis not present

## 2023-05-08 LAB — PULMONARY FUNCTION TEST
DL/VA % pred: 121 %
DL/VA: 4.97 ml/min/mmHg/L
DLCO cor % pred: 57 %
DLCO cor: 10.8 ml/min/mmHg
DLCO unc % pred: 58 %
DLCO unc: 11.12 ml/min/mmHg
FEF 25-75 Post: 1.97 L/s
FEF 25-75 Pre: 1.4 L/s
FEF2575-%Change-Post: 40 %
FEF2575-%Pred-Post: 130 %
FEF2575-%Pred-Pre: 92 %
FEV1-%Change-Post: 4 %
FEV1-%Pred-Post: 47 %
FEV1-%Pred-Pre: 45 %
FEV1-Post: 0.95 L
FEV1-Pre: 0.91 L
FEV1FVC-%Change-Post: 3 %
FEV1FVC-%Pred-Pre: 119 %
FEV6-%Change-Post: 0 %
FEV6-%Pred-Post: 40 %
FEV6-%Pred-Pre: 40 %
FEV6-Post: 1.04 L
FEV6-Pre: 1.03 L
FEV6FVC-%Pred-Post: 105 %
FEV6FVC-%Pred-Pre: 105 %
FVC-%Change-Post: 0 %
FVC-%Pred-Post: 38 %
FVC-%Pred-Pre: 38 %
FVC-Post: 1.04 L
FVC-Pre: 1.03 L
Post FEV1/FVC ratio: 92 %
Post FEV6/FVC ratio: 100 %
Pre FEV1/FVC ratio: 89 %
Pre FEV6/FVC Ratio: 100 %

## 2023-05-08 MED ORDER — ALBUTEROL SULFATE (2.5 MG/3ML) 0.083% IN NEBU
2.5000 mg | INHALATION_SOLUTION | Freq: Once | RESPIRATORY_TRACT | Status: AC
  Administered 2023-05-08: 2.5 mg via RESPIRATORY_TRACT

## 2023-05-08 NOTE — Progress Notes (Signed)
 Cardiology Office Note    Date:  05/08/2023   ID:  Latoya Cordova, DOB 11-15-1945, MRN [id number]  PCP:  Bosie Clos, MD  Cardiologist:  Julien Nordmann, MD  Electrophysiologist:  None   Chief Complaint: Follow-up  History of Present Illness:   Latoya Cordova is a 78 y.o. female with history of HFpEF, recently diagnosed severe ascending thoracic aortic aneurysm measuring 6.4 cm by CTA in 03/2023, AAA measuring 3.9 cm in 03/2023, HTN, HLD, obesity, and anxiety who presents for follow up of R/LHC.   Prior echo from 08/2014 showed an EF of 50-55% and mild mitral regurgitation. She was admitted to the hospital in 03/2015 with acute respiratory distress felt to be secondary to viral pneumonitis vs PNA. Echo at that time showed an EF of 60-65%, mild focal and moderate concentric LVH, LVOT gradient of 41 mmHg with Valsalva, Gr1DD, mildly dilated ascending aorta measuring 37 mm, normal RVSF and PASP. CTA chest was negative for PE, unremarkable thoracic aorta, and there were no significant coronary artery calcifications or aortic atherosclerosis noted. She was treated with Levaquin with symptom improvement. She was seen virtually in 10/2018, noting chronic SOB, "due to weight," and was taking Lasix every other day. There was high fluid intake and it was noted she would eat when bored. No changes were made at that time.  She did have a mechanical fall on 01/20/2020, while squatting down to put flowers on her husband's grave and scraped her knee. No LOC and she did not hit her head.  She was seen in the office on 01/28/2020 and was doing well from a cardiac perspective.  She noted a 1 to 2-year history of progressive exertional dyspnea.  She did feel like her sedentary lifestyle was contributing to this.  Echo on 02/11/2020 showed an EF of 55 to 60%, no regional wall motion abnormalities, grade 1 diastolic dysfunction, normal RV systolic function, estimated right atrial pressure of 3 mmHg, and no significant  valvular abnormalities.  She was seen in the office on 02/24/2020 and was doing well from a cardiac perspective.  She continued to note a stable 1 to 2-year history of progressive exertional dyspnea without chest pain.  In this setting she underwent Lexiscan MPI on 03/01/2020 which showed no significant ischemia or scar and was overall low risk.  CT attenuation corrected images showed evidence of aortic atherosclerosis with minimal coronary artery calcification.    She was seen in the office in 02/2023 noting an increase in shortness of breath from baseline chronic dyspnea.  She was without symptoms of frank chest pain.  At that time, she was scheduled for myocardial PET/CT and echo.  Subsequent echo on 03/27/2023 showed an EF of 60 to 65%, no regional wall motion abnormalities, grade 1 diastolic dysfunction, normal RV systolic function, poorly visualized aortic valve with mild aortic insufficiency, and a severe dilatation of the ascending aorta measuring 58 mm along with borderline dilatation of the aortic root measuring 38 mm (aorta documented to be normal in size and structure by echo in 01/2020).  In this setting, myocardial PET/CT was canceled with recommendation to proceed with [facility] following CTA of aortic aneurysm.  Subsequent CTA chest, abdomen, pelvis on 04/10/2023 showed ascending thoracic aortic aneurysm measuring up to 6.4 cm (aorta still 5 cm at the takeoff of the innominate artery and does not decrease to normal size until beyond the left subclavian artery), abdominal aortic aneurysm measuring 3.9 cm, aortic atherosclerosis, cardiomegaly, and colonic diverticulosis without  evidence of diverticulitis.  Subsequent review of imaging from 2021 shows an ascending thoracic aortic aneurysm measuring 5 to 5.5 cm at that time.  R/LHC on 04/25/2023 showed mild to moderate, nonobstructive CAD with up to 40 to 50% mid LAD stenosis and 20% disease involving proximal segment of a large ramus intermedius branch.  LCx  and RCA were without significant disease.  Upper normal left heart filling pressure, mildly elevated right heart and pulmonary pressure, and normal cardiac output/index.  She was evaluated by CVTS on 2//2025 with concern for prohibitive surgical repair risk in the setting of the magnitude of repair required since the aneurysm extended out into the aortic arch and in the context of her chronic shortness of breath with resting hypoxia.  PFTs were recommended, with follow-up thereafter.  She comes in accompanied by her son today and is without symptoms of angina or cardiac decompensation.  No chest, shoulder, back pain.  No dizziness, presyncope, or syncope.  No falls.  No symptoms concerning for bleeding.  No lower extremity swelling or progressive orthopnea.  Weight stable.  Not currently checking blood pressure at home.  PFTs are scheduled for this afternoon.  Has follow-up with CVTS on 2/17.   Labs independently reviewed: 03/2023 - BUN 19, serum creatinine 1, potassium 3.9, Hgb 14.4, PLT 143 03/2022 - A1c 6.1 07/2022 - albumin 4.2, AST/ALT normal, TSH normal 08/2021 - TC 141, TG 99, HDL 52, LDL 71  Past Medical History:  Diagnosis Date   Anxiety    Diastolic dysfunction    a. echo 08/2014: EF 50-55%, mild MR; b. echo 03/2015: EF 60-65%, RWMA could not be excluded, GR1DD, ascending aorta mildly dilated at 37mm, normal right-sided pressure   Hyperlipidemia    Hypertension    Morbid obesity (HCC)     Past Surgical History:  Procedure Laterality Date   ABDOMINAL HYSTERECTOMY  08/18/2004   APPENDECTOMY     CATARACT EXTRACTION Bilateral    RIGHT HEART CATH AND CORONARY ANGIOGRAPHY Bilateral 04/25/2023   Procedure: RIGHT HEART CATH AND CORONARY ANGIOGRAPHY;  Surgeon: Yvonne Kendall, MD;  Location: [facility] INVASIVE CV LAB;  Service: Cardiovascular;  Laterality: Bilateral;    Current Medications: Current Meds  Medication Sig   amLODipine (NORVASC) 5 MG tablet Take 1 tablet (5 mg total) by mouth  daily.   aspirin EC 81 MG tablet Take 1 tablet (81 mg total) by mouth daily. Swallow whole.   brimonidine (ALPHAGAN) 0.2 % ophthalmic solution Place 1 drop into both eyes 2 (two) times daily.   buPROPion (WELLBUTRIN XL) 300 MG 24 hr tablet Take 300 mg by mouth daily.   butalbital-aspirin-caffeine (FIORINAL) 50-325-40 MG capsule Take 1 capsule by mouth 2 (two) times daily as needed for headache.   Cholecalciferol (VITAMIN D) 50 MCG (2000 UT) CAPS Take 2,000 Units by mouth daily.   citalopram (CELEXA) 10 MG tablet Take 10 mg by mouth daily.   hydrochlorothiazide (HYDRODIURIL) 25 MG tablet Take 25 mg by mouth daily.   HYDROcodone-acetaminophen (NORCO/VICODIN) 5-325 MG tablet Take 1-2 tablets by mouth every 6 (six) hours as needed for moderate pain.   levothyroxine (SYNTHROID) 25 MCG tablet Take 1 tablet (25 mcg total) by mouth daily.   LORazepam (ATIVAN) 0.5 MG tablet TAKE ONE TABLET BY MOUTH TWICE A DAY AS NEEDED (Patient taking differently: Take 0.5 mg by mouth daily. Can take an additional dose if needed for anxiety)   losartan (COZAAR) 50 MG tablet Take 50 mg by mouth daily.   PARoxetine (PAXIL) 40  MG tablet TAKE ONE TABLET BY MOUTH ONE TIME DAILY **MUST CALL DR. FOR APPOINTMENT FOR FURTHER REFILLS**   rosuvastatin (CRESTOR) 10 MG tablet TAKE ONE TABLET BY MOUTH ONE TIME DAILY    Allergies:   Sulfa antibiotics and Lodine  [etodolac]   Social History   Socioeconomic History   Marital status: Widowed    Spouse name: Not on file   Number of children: 1   Years of education: Not on file   Highest education level: Associate degree: occupational, Scientist, product/process development, or vocational program  Occupational History   Occupation: retired  Tobacco Use   Smoking status: Never   Smokeless tobacco: Never  Vaping Use   Vaping status: Never Used  Substance and Sexual Activity   Alcohol use: No   Drug use: No   Sexual activity: Not on file  Other Topics Concern   Not on file  Social History Narrative    Not on file   Social Drivers of Health   Financial Resource Strain: High Risk (02/02/2023)   Received from [facility]   Overall Financial Resource Strain (CARDIA)    Difficulty of Paying Living Expenses: Hard  Food Insecurity: Food Insecurity Present (02/02/2023)   Received from [facility]   Hunger Vital Sign    Worried About Running Out of Food in the Last Year: Sometimes true    Ran Out of Food in the Last Year: Sometimes true  Transportation Needs: No Transportation Needs (02/02/2023)   Received from [facility] - Transportation    In the past 12 months, has lack of transportation kept you from medical appointments or from getting medications?: No    Lack of Transportation (Non-Medical): No  Physical Activity: Inactive (02/12/2023)   Received from [facility]   Exercise Vital Sign    Days of Exercise per Week: 0 days    Minutes of Exercise per Session: 0 min  Stress: Stress Concern Present (02/12/2023)   Received from [facility] - Occupational Stress Questionnaire    Feeling of Stress : Very much  Social Connections: Socially Isolated (02/12/2023)   Received from [facility]   Social Connection and Isolation Panel [NHANES]    Frequency of Communication with Friends and Family: More than three times a week    Frequency of Social Gatherings with Friends and Family: Never    Attends Religious Services: Never    Database administrator or Organizations: No    Attends Banker Meetings: Never    Marital Status: Widowed     Family History:  The patient's family history includes Breast cancer in her cousin and maternal aunt; Congestive Heart Failure in her mother; Heart attack in her paternal grandfather; Hypertension in her mother; Ulcers in her father.  ROS:   12-point review of systems is negative unless  otherwise noted in the HPI.   EKGs/Labs/Other Studies Reviewed:    Studies reviewed were summarized above. The additional studies were reviewed today:  2D echo 08/2014: - Left ventricle: The cavity size was normal. Systolic function was    normal. The estimated ejection fraction was in the range of 50%    to 55%.  - Mitral valve: There was mild regurgitation. __________   2D echo 03/2015: - Left ventricle: The cavity size was normal. There was mild focal    basal and moderate concentric hypertrophy of the septum. There  is    LVOT gradient of 41 mm Hg with valsalva (not well visualized)    Systolic function was normal. The estimated ejection fraction was    in the range of 60% to 65%. Regional wall motion abnormalities    cannot be excluded. Doppler parameters are consistent with    abnormal left ventricular relaxation (grade 1 diastolic    dysfunction).  - Aorta: Ascending aorta is mildly dilated, diameter: 37 mm (S).  - Left atrium: The atrium was normal in size.  - Right ventricle: Systolic function was normal.  - Pulmonary arteries: Systolic pressure was within the normal    range.   Impressions:   - Challenging image quality. __________   2D echo 02/11/2020: 1. Left ventricular ejection fraction, by estimation, is 55 to 60%. The  left ventricle has normal function. The left ventricle has no regional  wall motion abnormalities. Left ventricular diastolic parameters are  consistent with Grade I diastolic  dysfunction (impaired relaxation).   2. Right ventricular systolic function is normal. The right ventricular  size is not well visualized.   3. The mitral valve is grossly normal. No evidence of mitral valve  regurgitation.   4. The aortic valve was not well visualized. Aortic valve regurgitation  not assessed.   5. The inferior vena cava is normal in size with greater than 50%  respiratory variability, suggesting right atrial pressure of 3 mmHg. __________    Eugenie Birks MPI 03/01/2020: There was no ST segment deviation noted during stress. No T wave inversion was noted during stress. The study is normal. This is a low risk study. The left ventricular ejection fraction is hyperdynamic (>65%). CT attenuation images show evidence of aortic calcifications but minimal coronary calcifications. __________   2D echo 03/27/2023: 1. Left ventricular ejection fraction, by estimation, is 60 to 65%. The  left ventricle has normal function. The left ventricle has no regional  wall motion abnormalities. Left ventricular diastolic parameters are  consistent with Grade I diastolic  dysfunction (impaired relaxation).   2. Right ventricular systolic function is normal. The right ventricular  size is not well visualized.   3. The mitral valve is grossly normal. No evidence of mitral valve  regurgitation.   4. The aortic valve was not well visualized. Aortic valve regurgitation  is mild.   5. Aortic dilatation noted. There is severe dilatation of the ascending  aorta, measuring 58 mm. There is borderline dilatation of the aortic root,  measuring 38 mm.   Conclusion(s)/Recommendation(s): Severe ascending aorta dilation, 58 mm in  diameter. Urgent referal to CT surgery placed. CT angio chest and abdomen  to evaluate aorta also placed.  __________  CTA chest, abdomen, pelvis 04/14/2023: IMPRESSION: Vascular:   1. Ascending thoracic aortic aneurysm measuring up to 6.4 cm in maximal diameter. Recommend cardiothoracic/vascular surgery referral if not already obtained.  2. Abdominal aortic aneurysm measuring 3.9 cm. Recommend surveillance ultrasound in 3 years. Reference: Journal of Vascular Surgery 67.1 (2018): 2-77. J Am Coll Radiol 2013;10:789-794. 3.  Aortic Atherosclerosis (ICD10-I70.0).   Nonvascular:   1. Colonic diverticulosis without diverticulitis. 2. Cardiomegaly. __________  [facility] 04/25/2023: Conclusions: Mild to moderate, nonobstructive  coronary artery disease with up to 40-50% mid LAD stenosis and 20% disease involving proximal segment of large ramus intermedius branch.  LCx and RCA are without significant disease. Upper normal left heart filling pressure (PCWP 17 mmHg). Mildly elevated right heart and pulmonary artery pressures (mean RA/RV EDP 10 mmHg, PA 40/15 mmHg, mean PA 23 mmHg).  Normal Fick cardiac output/index (CO 5.7 L/min, CI 2.9 L/min/m). Tortuous right radial artery precluding catheterization from this approach.  Query vascular malformation versus IV fistula from remote IV placement in the antecubital fossa based on sheath angiogram and physical exam.  Alternative access recommended for future catheterizations if necessary.   Recommendations: Continue secondary prevention of ASCVD. Proceed with cardiac surgery evaluation for further management of severely dilated ascending aorta. Consider nonemergent duplex of right radial artery/antecubital fossa.   EKG:  EKG is ordered today.  The EKG ordered today demonstrates NSR, 81 bpm, first-degree AV block, poor R wave progression along the precordial leads, no acute ST-T changes  Recent Labs: 04/17/2023: BUN 19; Creatinine, Ser 1.00; Platelets 143 04/25/2023: Hemoglobin 13.9; Potassium 3.8; Sodium 139  Recent Lipid Panel    Component Value Date/Time   CHOL 141 09/02/2021 1100   TRIG 99 09/02/2021 1100   HDL 52 09/02/2021 1100   CHOLHDL 2.7 09/02/2021 1100   CHOLHDL 3.8 02/19/2017 1445   LDLCALC 71 09/02/2021 1100   LDLCALC 109 (H) 02/19/2017 1445   LDLDIRECT 104 (H) 04/22/2020 1411    PHYSICAL EXAM:    VS:  BP 130/74 (BP Location: Left Arm, Patient Position: Sitting, Cuff Size: Normal)   Pulse 81   Ht 5\' 4"  (1.626 m)   Wt 212 lb 6.4 oz (96.3 kg)   SpO2 94%   BMI 36.46 kg/m   BMI: Body mass index is 36.46 kg/m.  Physical Exam Vitals reviewed.  Constitutional:      Appearance: She is well-developed.  HENT:     Head: Normocephalic and atraumatic.   Eyes:     General:        Right eye: No discharge.        Left eye: No discharge.  Cardiovascular:     Rate and Rhythm: Normal rate and regular rhythm.     Heart sounds: Normal heart sounds, S1 normal and S2 normal. Heart sounds not distant. No midsystolic click and no opening snap. No murmur heard.    No friction rub.  Pulmonary:     Effort: Pulmonary effort is normal. No respiratory distress.     Breath sounds: Normal breath sounds. No decreased breath sounds, wheezing, rhonchi or rales.  Chest:     Chest wall: No tenderness.  Musculoskeletal:     Cervical back: Normal range of motion.  Skin:    General: Skin is warm and dry.     Nails: There is no clubbing.  Neurological:     Mental Status: She is alert and oriented to person, place, and time.  Psychiatric:        Speech: Speech normal.        Behavior: Behavior normal.        Thought Content: Thought content normal.        Judgment: Judgment normal.     Wt Readings from Last 3 Encounters:  05/08/23 212 lb 6.4 oz (96.3 kg)  05/01/23 207 lb (93.9 kg)  04/25/23 207 lb 3.2 oz (94 kg)     ASSESSMENT & PLAN:   Severe ascending thoracic aortic aneurysm: Noted on echo in 02/2023 measuring 5.8 cm. Follow-up CTA chest, abdomen, pelvis, showed ascending thoracic aortic aneurysm measuring 6.4 cm with the aorta still measuring 5 cm at the takeoff of the innominate artery and does not decrease to normal size until beyond the left subclavian artery. Upon review of echo and CTA images from Lexiscan MPI in 2021, ascending thoracic aorta was measuring 5  to 5.5 cm at that time.  Evaluated by CVTS with concern regarding surgical risk in the context of magnitude of surgical repair required, age, deconditioning, and chronic shortness of breath and hypoxia.  PFTs are scheduled for this afternoon (previously recommended by cardiology and pulmonology).  Precautions discussed.  Nonobstructive CAD: No symptoms suggestive of angina or cardiac  decompensation.  Continue aspirin 81 mg, amlodipine 5 mg, and rosuvastatin 10 mg.  Defer nonemergent duplex of right radial artery/antecubital fossa for now pending the above workup.  HFpEF with chronic dyspnea: Without decompensation.  Remains on furosemide 20 mg.  Given body habitus, she may not be a good candidate for SGLT2 inhibitor.  In follow-up, look to further escalate GDMT with addition of MRA.  PFTs scheduled for this afternoon.  AAA: Incidentally noted on CTA, measuring 3.9 cm.  Follow-up imaging in 03/2024.  HTN: Blood pressure is mildly elevated in the office, though typically well-controlled.  She will begin checking her blood pressure daily and bring BP log with her to future appointments.  She remains on amlodipine 5 mg and losartan 50 mg.  HLD: LDL 71 in 08/2021.  She remains on rosuvastatin 10 mg.    Disposition: F/u with me after CVTS appointment at her request.   Medication Adjustments/Labs and Tests Ordered: Current medicines are reviewed at length with the patient today.  Concerns regarding medicines are outlined above. Medication changes, Labs and Tests ordered today are summarized above and listed in the Patient Instructions accessible in Encounters.   Signed, Eula Listen, PA-C 05/08/2023 12:28 PM     [facility] [place] [address] [place], [place] [address] [phone]

## 2023-05-08 NOTE — Patient Instructions (Signed)
Medication Instructions:  Your Physician recommend you continue on your current medication as directed.    *If you need a refill on your cardiac medications before your next appointment, please call your pharmacy*   Lab Work: None ordered at this time  If you have labs (blood work) drawn today and your tests are completely normal, you will receive your results only by: MyChart Message (if you have MyChart) OR A paper copy in the mail If you have any lab test that is abnormal or we need to change your treatment, we will call you to review the results.   Testing/Procedures: None ordered at this time    Follow-Up: At [facility], you and your health needs are our priority.  As part of our continuing mission to provide you with exceptional heart care, we have created designated Provider Care Teams.  These Care Teams include your primary Cardiologist (physician) and Advanced Practice Providers (APPs -  Physician Assistants and Nurse Practitioners) who all work together to provide you with the care you need, when you need it.  We recommend signing up for the patient portal called "MyChart".  Sign up information is provided on this After Visit Summary.  MyChart is used to connect with patients for Virtual Visits (Telemedicine).  Patients are able to view lab/test results, encounter notes, upcoming appointments, etc.  Non-urgent messages can be sent to your provider as well.   To learn more about what you can do with MyChart, go to [email or web address].    Your next appointment:   Schedule for 10:05 on 05/18/23  Provider:   You may see Julien Nordmann, MD or one of the following Advanced Practice Providers on your designated Care Team:   Eula Listen, [place]

## 2023-05-09 ENCOUNTER — Encounter (HOSPITAL_COMMUNITY): Payer: PPO

## 2023-05-14 ENCOUNTER — Ambulatory Visit: Payer: PPO | Admitting: Surgery

## 2023-05-14 VITALS — BP 118/69 | HR 89 | Resp 20 | Ht 64.0 in | Wt 206.0 lb

## 2023-05-14 DIAGNOSIS — I7121 Aneurysm of the ascending aorta, without rupture: Secondary | ICD-10-CM | POA: Diagnosis not present

## 2023-05-15 NOTE — Progress Notes (Signed)
 HPI:  The patient returns today to discuss the results of her pulmonary function testing and room air arterial blood gas.  She was referred for consideration of surgical treatment of a 6.4 cm fusiform ascending aortic aneurysm extending into the aortic arch.  She has a long history of chronic shortness of breath with minimal exertion in her house and was evaluated by pulmonary medicine in February 2024.  It was felt that this was likely due to a combination of central obesity and congestive heart failure.  She was post pulmonary function testing at that time but it was not completed.  Her recent PFTs on 05/08/2023 showed an FEV1 of 0.91 which was 45% of predicted.  FVC was 1.03 or 38% predicted.  Diffusion capacity was 58% of predicted.  Room air arterial blood gas testing showed a pCO2 of 46 and a pO2 of 56 with a saturation of 88%.  Current Outpatient Medications  Medication Sig Dispense Refill   amLODipine (NORVASC) 5 MG tablet Take 1 tablet (5 mg total) by mouth daily. 90 tablet 2   aspirin EC 81 MG tablet Take 1 tablet (81 mg total) by mouth daily. Swallow whole.     brimonidine (ALPHAGAN) 0.2 % ophthalmic solution Place 1 drop into both eyes 2 (two) times daily.     buPROPion (WELLBUTRIN XL) 300 MG 24 hr tablet Take 300 mg by mouth daily.     butalbital-aspirin-caffeine (FIORINAL) 50-325-40 MG capsule Take 1 capsule by mouth 2 (two) times daily as needed for headache. 50 capsule 2   Cholecalciferol (VITAMIN D) 50 MCG (2000 UT) CAPS Take 2,000 Units by mouth daily.     citalopram (CELEXA) 10 MG tablet Take 10 mg by mouth daily.     furosemide (LASIX) 20 MG tablet TAKE 1 TABLET(20 MG) BY MOUTH EVERY MORNING 30 tablet 11   hydrochlorothiazide (HYDRODIURIL) 25 MG tablet Take 25 mg by mouth daily.     HYDROcodone-acetaminophen (NORCO/VICODIN) 5-325 MG tablet Take 1-2 tablets by mouth every 6 (six) hours as needed for moderate pain. 50 tablet 0   levothyroxine (SYNTHROID) 25 MCG tablet Take 1  tablet (25 mcg total) by mouth daily. 90 tablet 1   LORazepam (ATIVAN) 0.5 MG tablet TAKE ONE TABLET BY MOUTH TWICE A DAY AS NEEDED (Patient taking differently: Take 0.5 mg by mouth daily. Can take an additional dose if needed for anxiety) 30 tablet 3   losartan (COZAAR) 50 MG tablet Take 50 mg by mouth daily.     PARoxetine (PAXIL) 40 MG tablet TAKE ONE TABLET BY MOUTH ONE TIME DAILY **MUST CALL DR. FOR APPOINTMENT FOR FURTHER REFILLS** 30 tablet 0   rosuvastatin (CRESTOR) 10 MG tablet TAKE ONE TABLET BY MOUTH ONE TIME DAILY 30 tablet 11   No current facility-administered medications for this visit.     Physical Exam: BP 118/69 (BP Location: Left Arm)   Pulse 89   Resp 20   Ht 5\' 4"  (1.626 m)   Wt 206 lb (93.4 kg)   SpO2 90% Comment: RA  BMI 35.36 kg/m  She looks comfortable.  Cardiac exam shows regular rate and rhythm with normal heart sounds.  There is no murmur. Lung exam reveals decreased breath sounds throughout. There is no peripheral edema.  Diagnostic Tests:  Component Ref Range & Units (hover) 7 d ago  FVC-Pre 1.03  FVC-%Pred-Pre 38  FVC-Post 1.04  FVC-%Pred-Post 38  FVC-%Change-Post 0  FEV1-Pre 0.91  FEV1-%Pred-Pre 45  FEV1-Post 0.95  FEV1-%Pred-Post 47  FEV1-%Change-Post 4  FEV6-Pre 1.03  FEV6-%Pred-Pre 40  FEV6-Post 1.04  FEV6-%Pred-Post 40  FEV6-%Change-Post 0  Pre FEV1/FVC ratio 89  FEV1FVC-%Pred-Pre 119  Post FEV1/FVC ratio 92  FEV1FVC-%Change-Post 3  Pre FEV6/FVC Ratio 100  FEV6FVC-%Pred-Pre 105  Post FEV6/FVC ratio 100  FEV6FVC-%Pred-Post 105  FEF 25-75 Pre 1.40  FEF2575-%Pred-Pre 92  FEF 25-75 Post 1.97  FEF2575-%Pred-Post 130  FEF2575-%Change-Post 40  DLCO unc 11.12  DLCO unc % pred 58  DLCO cor 10.80  DLCO cor % pred 57  DL/VA 1.61  DL/VA % pred 096    Impression:  Her PFTs show a severe obstructive and restrictive defect with at least moderate decrease in diffusion capacity.  Arterial blood gas testing shows resting hypercarbia  and hypoxemia.  This is likely due to a combination of intrinsic lung disease as well as obesity and body habitus.  Given these findings and her advanced age I do not think she is a candidate for surgical repair of this aneurysm which would require replacement of the ascending aorta and aortic arch to beyond the left subclavian artery with bypasses to the arch vessels with deep hypothermic circulatory arrest. It may also require replacement of her aortic root if the tissues at her sinotubular junction are too weak for supra coronary anastomosis.  I reviewed the CTA images again with the patient and her son as well as the PFT and blood gas results.  I explained the reasons for not considering her candidate for cardiac surgery and I think her and her son understand that.  I stressed the importance of continued good blood pressure control in preventing further enlargement of her aorta and acute aortic dissection.  Plan:  I will plan to see her back in 6 months with a CTA of the chest, abdomen, and pelvis for aortic surveillance and prognostic purposes but she understands that she is not a surgical candidate under any circumstances.  I spent 15 minutes performing this established patient evaluation and > 50% of this time was spent face to face counseling and coordinating the care of this patient's aortic aneurysm.    Alleen Borne, MD [facility] [phone]

## 2023-05-18 ENCOUNTER — Ambulatory Visit: Payer: PPO | Attending: Physician Assistant | Admitting: Physician Assistant

## 2023-05-18 ENCOUNTER — Encounter: Payer: Self-pay | Admitting: Physician Assistant

## 2023-05-18 VITALS — BP 110/68 | HR 85 | Ht 64.0 in | Wt 206.8 lb

## 2023-05-18 DIAGNOSIS — I7121 Aneurysm of the ascending aorta, without rupture: Secondary | ICD-10-CM | POA: Diagnosis not present

## 2023-05-18 DIAGNOSIS — I10 Essential (primary) hypertension: Secondary | ICD-10-CM

## 2023-05-18 DIAGNOSIS — I2510 Atherosclerotic heart disease of native coronary artery without angina pectoris: Secondary | ICD-10-CM | POA: Diagnosis not present

## 2023-05-18 DIAGNOSIS — E785 Hyperlipidemia, unspecified: Secondary | ICD-10-CM

## 2023-05-18 DIAGNOSIS — I5032 Chronic diastolic (congestive) heart failure: Secondary | ICD-10-CM | POA: Diagnosis not present

## 2023-05-18 DIAGNOSIS — I714 Abdominal aortic aneurysm, without rupture, unspecified: Secondary | ICD-10-CM

## 2023-05-18 NOTE — Patient Instructions (Signed)
 Medication Instructions:  Your Physician recommend you continue on your current medication as directed.    *If you need a refill on your cardiac medications before your next appointment, please call your pharmacy*   Lab Work: None ordered at this time  If you have labs (blood work) drawn today and your tests are completely normal, you will receive your results only by: MyChart Message (if you have MyChart) OR A paper copy in the mail If you have any lab test that is abnormal or we need to change your treatment, we will call you to review the results.   Follow-Up: At [facility], you and your health needs are our priority.  As part of our continuing mission to provide you with exceptional heart care, we have created designated Provider Care Teams.  These Care Teams include your primary Cardiologist (physician) and Advanced Practice Providers (APPs -  Physician Assistants and Nurse Practitioners) who all work together to provide you with the care you need, when you need it.  We recommend signing up for the patient portal called "MyChart".  Sign up information is provided on this After Visit Summary.  MyChart is used to connect with patients for Virtual Visits (Telemedicine).  Patients are able to view lab/test results, encounter notes, upcoming appointments, etc.  Non-urgent messages can be sent to your provider as well.   To learn more about what you can do with MyChart, go to [email or web address].    Your next appointment:   4 month(s)  Provider:   You may see Julien Nordmann, MD or one of the following Advanced Practice Providers on your designated Care Team:   Eula Listen, [place]

## 2023-05-18 NOTE — Progress Notes (Signed)
 Cardiology Office Note    Date:  05/18/2023   ID:  Yatziri, Wainwright 1946-02-12, MRN [id number]  PCP:  Bosie Clos, MD  Cardiologist:  Julien Nordmann, MD  Electrophysiologist:  None   Chief Complaint: Follow up  History of Present Illness:   Latoya Cordova is a 78 y.o. female with history of HFpEF, recently diagnosed severe ascending thoracic aortic aneurysm measuring 6.4 cm by CTA in 03/2023, AAA measuring 3.9 cm in 03/2023, HTN, HLD, obesity, and anxiety who presents for follow up of ascending thoracic aortic aneurysm.   Prior echo from 08/2014 showed an EF of 50-55% and mild mitral regurgitation. She was admitted to the hospital in 03/2015 with acute respiratory distress felt to be secondary to viral pneumonitis vs PNA. Echo at that time showed an EF of 60-65%, mild focal and moderate concentric LVH, LVOT gradient of 41 mmHg with Valsalva, Gr1DD, mildly dilated ascending aorta measuring 37 mm, normal RVSF and PASP. CTA chest was negative for PE, unremarkable thoracic aorta, and there were no significant coronary artery calcifications or aortic atherosclerosis noted. She was treated with Levaquin with symptom improvement. She was seen virtually in 10/2018, noting chronic SOB, "due to weight," and was taking Lasix every other day. There was high fluid intake and it was noted she would eat when bored. No changes were made at that time.  She did have a mechanical fall on 01/20/2020, while squatting down to put flowers on her husband's grave and scraped her knee. No LOC and she did not hit her head.  She was seen in the office on 01/28/2020 and was doing well from a cardiac perspective.  She noted a 1 to 2-year history of progressive exertional dyspnea.  She did feel like her sedentary lifestyle was contributing to this.  Echo on 02/11/2020 showed an EF of 55 to 60%, no regional wall motion abnormalities, grade 1 diastolic dysfunction, normal RV systolic function, estimated right atrial pressure  of 3 mmHg, and no significant valvular abnormalities.  She was seen in the office on 02/24/2020 and was doing well from a cardiac perspective.  She continued to note a stable 1 to 2-year history of progressive exertional dyspnea without chest pain.  In this setting she underwent Lexiscan MPI on 03/01/2020 which showed no significant ischemia or scar and was overall low risk.  CT attenuation corrected images showed evidence of aortic atherosclerosis with minimal coronary artery calcification.     She was seen in the office in 02/2023 noting an increase in shortness of breath from baseline chronic dyspnea.  She was without symptoms of frank chest pain.  At that time, she was scheduled for myocardial PET/CT and echo.  Subsequent echo on 03/27/2023 showed an EF of 60 to 65%, no regional wall motion abnormalities, grade 1 diastolic dysfunction, normal RV systolic function, poorly visualized aortic valve with mild aortic insufficiency, and a severe dilatation of the ascending aorta measuring 58 mm along with borderline dilatation of the aortic root measuring 38 mm (aorta documented to be normal in size and structure by echo in 01/2020).  In this setting, myocardial PET/CT was canceled with recommendation to proceed with [facility] following CTA of aortic aneurysm.  Subsequent CTA chest, abdomen, pelvis on 04/10/2023 showed ascending thoracic aortic aneurysm measuring up to 6.4 cm (aorta still 5 cm at the takeoff of the innominate artery and does not decrease to normal size until beyond the left subclavian artery), abdominal aortic aneurysm measuring 3.9 cm, aortic atherosclerosis,  cardiomegaly, and colonic diverticulosis without evidence of diverticulitis.  Subsequent review of imaging from 2021 shows an ascending thoracic aortic aneurysm measuring 5 to 5.5 cm at that time.  R/LHC on 04/25/2023 showed mild to moderate, nonobstructive CAD with up to 40 to 50% mid LAD stenosis and 20% disease involving proximal segment of a large  ramus intermedius branch.  LCx and RCA were without significant disease.  Upper normal left heart filling pressure, mildly elevated right heart and pulmonary pressure, and normal cardiac output/index.  She was evaluated by CVTS on 2//2025 with concern for prohibitive surgical repair risk in the setting of the magnitude of repair required since the aneurysm extended out into the aortic arch and in the context of her chronic shortness of breath with resting hypoxia.  Subsequent PFTs demonstrated severe obstructive and restrictive defect with at least moderate decrease in diffusion capacity.  Prior ABG demonstrated resting hypercarbia and hypoxemia felt to be due to a combination of intrinsic lung disease as well as obesity and body habitus.  She followed up with CVTS and was deemed to not be a surgical candidate.  She comes in accompanied by her son today and is without symptoms of angina or cardiac decompensation.  Chronic dyspnea is stable.  No significant mid scapular discomfort.  She indicates she was relieved when CVTS declined surgical repair.  She reports that she does not want to move forward with surgery, though is interested in hearing from a second opinion.  No significant lower extremity swelling or progressive orthopnea.  No dizziness, presyncope, or syncope.   Labs independently reviewed: 03/2023 - BUN 19, serum creatinine 1, potassium 3.9, Hgb 14.4, PLT 143 03/2022 - A1c 6.1 07/2022 - albumin 4.2, AST/ALT normal, TSH normal 08/2021 - TC 141, TG 99, HDL 52, LDL 71  Past Medical History:  Diagnosis Date   Anxiety    Diastolic dysfunction    a. echo 08/2014: EF 50-55%, mild MR; b. echo 03/2015: EF 60-65%, RWMA could not be excluded, GR1DD, ascending aorta mildly dilated at 37mm, normal right-sided pressure   Hyperlipidemia    Hypertension    Morbid obesity (HCC)     Past Surgical History:  Procedure Laterality Date   ABDOMINAL HYSTERECTOMY  08/18/2004   APPENDECTOMY     CATARACT  EXTRACTION Bilateral    RIGHT HEART CATH AND CORONARY ANGIOGRAPHY Bilateral 04/25/2023   Procedure: RIGHT HEART CATH AND CORONARY ANGIOGRAPHY;  Surgeon: Yvonne Kendall, MD;  Location: [facility] INVASIVE CV LAB;  Service: Cardiovascular;  Laterality: Bilateral;    Current Medications: Current Meds  Medication Sig   amLODipine (NORVASC) 5 MG tablet Take 1 tablet (5 mg total) by mouth daily.   aspirin EC 81 MG tablet Take 1 tablet (81 mg total) by mouth daily. Swallow whole.   brimonidine (ALPHAGAN) 0.2 % ophthalmic solution Place 1 drop into both eyes 2 (two) times daily.   buPROPion (WELLBUTRIN XL) 300 MG 24 hr tablet Take 300 mg by mouth daily.   butalbital-aspirin-caffeine (FIORINAL) 50-325-40 MG capsule Take 1 capsule by mouth 2 (two) times daily as needed for headache.   Cholecalciferol (VITAMIN D) 50 MCG (2000 UT) CAPS Take 2,000 Units by mouth daily.   citalopram (CELEXA) 10 MG tablet Take 10 mg by mouth daily.   furosemide (LASIX) 20 MG tablet TAKE 1 TABLET(20 MG) BY MOUTH EVERY MORNING   hydrochlorothiazide (HYDRODIURIL) 25 MG tablet Take 25 mg by mouth daily.   HYDROcodone-acetaminophen (NORCO/VICODIN) 5-325 MG tablet Take 1-2 tablets by mouth every 6 (  six) hours as needed for moderate pain.   levothyroxine (SYNTHROID) 25 MCG tablet Take 1 tablet (25 mcg total) by mouth daily.   LORazepam (ATIVAN) 0.5 MG tablet TAKE ONE TABLET BY MOUTH TWICE A DAY AS NEEDED (Patient taking differently: Take 0.5 mg by mouth daily. Can take an additional dose if needed for anxiety)   losartan (COZAAR) 50 MG tablet Take 50 mg by mouth daily.   PARoxetine (PAXIL) 40 MG tablet TAKE ONE TABLET BY MOUTH ONE TIME DAILY **MUST CALL DR. FOR APPOINTMENT FOR FURTHER REFILLS**   rosuvastatin (CRESTOR) 10 MG tablet TAKE ONE TABLET BY MOUTH ONE TIME DAILY    Allergies:   Sulfa antibiotics and Lodine  [etodolac]   Social History   Socioeconomic History   Marital status: Widowed    Spouse name: Not on file    Number of children: 1   Years of education: Not on file   Highest education level: Associate degree: occupational, Scientist, product/process development, or vocational program  Occupational History   Occupation: retired  Tobacco Use   Smoking status: Never   Smokeless tobacco: Never  Vaping Use   Vaping status: Never Used  Substance and Sexual Activity   Alcohol use: No   Drug use: No   Sexual activity: Not on file  Other Topics Concern   Not on file  Social History Narrative   Not on file   Social Drivers of Health   Financial Resource Strain: High Risk (02/02/2023)   Received from [facility]   Overall Financial Resource Strain (CARDIA)    Difficulty of Paying Living Expenses: Hard  Food Insecurity: Food Insecurity Present (02/02/2023)   Received from [facility]   Hunger Vital Sign    Worried About Running Out of Food in the Last Year: Sometimes true    Ran Out of Food in the Last Year: Sometimes true  Transportation Needs: No Transportation Needs (02/02/2023)   Received from [facility] - Transportation    In the past 12 months, has lack of transportation kept you from medical appointments or from getting medications?: No    Lack of Transportation (Non-Medical): No  Physical Activity: Inactive (02/12/2023)   Received from [facility]   Exercise Vital Sign    Days of Exercise per Week: 0 days    Minutes of Exercise per Session: 0 min  Stress: Stress Concern Present (02/12/2023)   Received from [facility] - Occupational Stress Questionnaire    Feeling of Stress : Very much  Social Connections: Socially Isolated (02/12/2023)   Received from [facility]   Social Connection and Isolation Panel [NHANES]    Frequency of Communication with Friends and Family: More than three times a week    Frequency of Social Gatherings with Friends and  Family: Never    Attends Religious Services: Never    Database administrator or Organizations: No    Attends Banker Meetings: Never    Marital Status: Widowed     Family History:  The patient's family history includes Breast cancer in her cousin and maternal aunt; Congestive Heart Failure in her mother; Heart attack in her paternal grandfather; Hypertension in her mother; Ulcers in her father.  ROS:   12-point review of systems is negative unless otherwise noted in the HPI.   EKGs/Labs/Other Studies Reviewed:    Studies reviewed were summarized above. The additional  studies were reviewed today:  2D echo 08/2014: - Left ventricle: The cavity size was normal. Systolic function was    normal. The estimated ejection fraction was in the range of 50%    to 55%.  - Mitral valve: There was mild regurgitation. __________   2D echo 03/2015: - Left ventricle: The cavity size was normal. There was mild focal    basal and moderate concentric hypertrophy of the septum. There is    LVOT gradient of 41 mm Hg with valsalva (not well visualized)    Systolic function was normal. The estimated ejection fraction was    in the range of 60% to 65%. Regional wall motion abnormalities    cannot be excluded. Doppler parameters are consistent with    abnormal left ventricular relaxation (grade 1 diastolic    dysfunction).  - Aorta: Ascending aorta is mildly dilated, diameter: 37 mm (S).  - Left atrium: The atrium was normal in size.  - Right ventricle: Systolic function was normal.  - Pulmonary arteries: Systolic pressure was within the normal    range.   Impressions:   - Challenging image quality. __________   2D echo 02/11/2020: 1. Left ventricular ejection fraction, by estimation, is 55 to 60%. The  left ventricle has normal function. The left ventricle has no regional  wall motion abnormalities. Left ventricular diastolic parameters are  consistent with Grade I diastolic   dysfunction (impaired relaxation).   2. Right ventricular systolic function is normal. The right ventricular  size is not well visualized.   3. The mitral valve is grossly normal. No evidence of mitral valve  regurgitation.   4. The aortic valve was not well visualized. Aortic valve regurgitation  not assessed.   5. The inferior vena cava is normal in size with greater than 50%  respiratory variability, suggesting right atrial pressure of 3 mmHg. __________   Eugenie Birks MPI 03/01/2020: There was no ST segment deviation noted during stress. No T wave inversion was noted during stress. The study is normal. This is a low risk study. The left ventricular ejection fraction is hyperdynamic (>65%). CT attenuation images show evidence of aortic calcifications but minimal coronary calcifications. __________   2D echo 03/27/2023: 1. Left ventricular ejection fraction, by estimation, is 60 to 65%. The  left ventricle has normal function. The left ventricle has no regional  wall motion abnormalities. Left ventricular diastolic parameters are  consistent with Grade I diastolic  dysfunction (impaired relaxation).   2. Right ventricular systolic function is normal. The right ventricular  size is not well visualized.   3. The mitral valve is grossly normal. No evidence of mitral valve  regurgitation.   4. The aortic valve was not well visualized. Aortic valve regurgitation  is mild.   5. Aortic dilatation noted. There is severe dilatation of the ascending  aorta, measuring 58 mm. There is borderline dilatation of the aortic root,  measuring 38 mm.   Conclusion(s)/Recommendation(s): Severe ascending aorta dilation, 58 mm in  diameter. Urgent referal to CT surgery placed. CT angio chest and abdomen  to evaluate aorta also placed.  __________   CTA chest, abdomen, pelvis 04/14/2023: IMPRESSION: Vascular:   1. Ascending thoracic aortic aneurysm measuring up to 6.4 cm in maximal diameter.  Recommend cardiothoracic/vascular surgery referral if not already obtained.  2. Abdominal aortic aneurysm measuring 3.9 cm. Recommend surveillance ultrasound in 3 years. Reference: Journal of Vascular Surgery 67.1 (2018): 2-77. J Am Coll Radiol 2013;10:789-794. 3.  Aortic Atherosclerosis (ICD10-I70.0).  Nonvascular:   1. Colonic diverticulosis without diverticulitis. 2. Cardiomegaly. __________   [facility] 04/25/2023: Conclusions: Mild to moderate, nonobstructive coronary artery disease with up to 40-50% mid LAD stenosis and 20% disease involving proximal segment of large ramus intermedius branch.  LCx and RCA are without significant disease. Upper normal left heart filling pressure (PCWP 17 mmHg). Mildly elevated right heart and pulmonary artery pressures (mean RA/RV EDP 10 mmHg, PA 40/15 mmHg, mean PA 23 mmHg). Normal Fick cardiac output/index (CO 5.7 L/min, CI 2.9 L/min/m). Tortuous right radial artery precluding catheterization from this approach.  Query vascular malformation versus IV fistula from remote IV placement in the antecubital fossa based on sheath angiogram and physical exam.  Alternative access recommended for future catheterizations if necessary.   Recommendations: Continue secondary prevention of ASCVD. Proceed with cardiac surgery evaluation for further management of severely dilated ascending aorta. Consider nonemergent duplex of right radial artery/antecubital fossa.   EKG:  EKG is ordered today.  The EKG ordered today demonstrates NSR, 85 bpm, left axis deviation, LVH, poor R wave progression along the precordial leads, no acute ST-T changes  Recent Labs: 04/17/2023: BUN 19; Creatinine, Ser 1.00; Platelets 143 04/25/2023: Hemoglobin 13.9; Potassium 3.8; Sodium 139  Recent Lipid Panel    Component Value Date/Time   CHOL 141 09/02/2021 1100   TRIG 99 09/02/2021 1100   HDL 52 09/02/2021 1100   CHOLHDL 2.7 09/02/2021 1100   CHOLHDL 3.8 02/19/2017 1445   LDLCALC  71 09/02/2021 1100   LDLCALC 109 (H) 02/19/2017 1445   LDLDIRECT 104 (H) 04/22/2020 1411    PHYSICAL EXAM:    VS:  BP 110/68 (BP Location: Left Arm, Patient Position: Sitting, Cuff Size: Normal)   Pulse 85   Ht 5\' 4"  (1.626 m)   Wt 206 lb 12.8 oz (93.8 kg)   SpO2 95%   BMI 35.50 kg/m   BMI: Body mass index is 35.5 kg/m.  Physical Exam Vitals reviewed.  Constitutional:      Appearance: She is well-developed.  HENT:     Head: Normocephalic and atraumatic.  Eyes:     General:        Right eye: No discharge.        Left eye: No discharge.  Cardiovascular:     Rate and Rhythm: Normal rate and regular rhythm.     Heart sounds: Normal heart sounds, S1 normal and S2 normal. Heart sounds not distant. No midsystolic click and no opening snap. No murmur heard.    No friction rub.  Pulmonary:     Effort: Pulmonary effort is normal. No respiratory distress.     Breath sounds: Normal breath sounds. No decreased breath sounds, wheezing, rhonchi or rales.  Chest:     Chest wall: No tenderness.  Musculoskeletal:     Cervical back: Normal range of motion.  Skin:    General: Skin is warm and dry.     Nails: There is no clubbing.  Neurological:     Mental Status: She is alert and oriented to person, place, and time.  Psychiatric:        Speech: Speech normal.        Behavior: Behavior normal.        Thought Content: Thought content normal.        Judgment: Judgment normal.     Wt Readings from Last 3 Encounters:  05/18/23 206 lb 12.8 oz (93.8 kg)  05/14/23 206 lb (93.4 kg)  05/08/23 212 lb 6.4 oz (96.3 kg)  ASSESSMENT & PLAN:   Severe ascending thoracic aortic aneurysm: Noted on echo in 02/2023 measuring 5.8 cm. Follow-up CTA chest, abdomen, pelvis, showed ascending thoracic aortic aneurysm measuring 6.4 cm with the aorta still measuring 5 cm at the takeoff of the innominate artery and does not decrease to normal size until beyond the left subclavian artery. Upon review of  echo and CTA images from Lexiscan MPI in 2021, ascending thoracic aorta was measuring 5 to 5.5 cm at that time.  Evaluated by CVTS with concern regarding surgical risk in the context of magnitude of surgical repair required, age, deconditioning, and chronic shortness of breath and hypoxia.  PFTs demonstrated severe obstructive and restrictive defect with at least moderate decrease in diffusion capacity.  Not felt to be a surgical candidate with CVTS.  Given complexity of case and magnitude of potential operative repair, we will obtain second opinion.  Precautions discussed in detail.  Nonobstructive CAD: No symptoms suggestive of angina or cardiac decompensation.  Continue aspirin 81 mg, amlodipine 5 mg, and rosuvastatin 10 mg.  Defer nonemergent duplex of right radial artery/antecubital fossa for now pending the above workup.  HFpEF with chronic dyspnea: Without decompensation.  Recent PFTs concerning for obstructive and restrictive pulmonary disease.  Unlikely to be a good candidate for SGLT2 inhibitor given body habitus with concern for off target effect.  For now, remains on furosemide 20 mg daily.  In follow-up, consider addition of MRA.  AAA: Incidentally noted on CTA, measuring 3.9 cm.  Follow-up imaging in 03/2024.  HTN: Blood pressure is well-controlled in the office.  She remains on amlodipine 5 mg, HCTZ 25 mg, and losartan 50 mg.  HLD: LDL 71 in 08/2021.  She remains on rosuvastatin 10 mg.    Disposition: F/u with Dr. Mariah Milling or an APP in 4 months.   Medication Adjustments/Labs and Tests Ordered: Current medicines are reviewed at length with the patient today.  Concerns regarding medicines are outlined above. Medication changes, Labs and Tests ordered today are summarized above and listed in the Patient Instructions accessible in Encounters.   Signed, Eula Listen, PA-C 05/18/2023 12:55 PM     [facility] [place] [address] [place], [place] [address] [phone]

## 2023-05-23 ENCOUNTER — Telehealth: Payer: Self-pay | Admitting: Physician Assistant

## 2023-05-23 ENCOUNTER — Encounter: Payer: PPO | Admitting: Surgery

## 2023-05-23 NOTE — Telephone Encounter (Signed)
 Pt c/o BP issue: STAT if pt c/o blurred vision, one-sided weakness or slurred speech  1. What are your last 5 BP readings? 125/78, 131/62, 130/62, 134/80  2. Are you having any other symptoms (ex. Dizziness, headache, blurred vision, passed out)? no  3. What is your BP issue? Latoya Cordova told her her BP needs to be 105-115  range and if it wasn't to let him know

## 2023-05-24 NOTE — Telephone Encounter (Signed)
 Thank you for the update. Agree with recommendation to increase amlodipine to 10 mg daily if her BP is consistently greater than 115 mmHg systolic (given aneurysm). Watch for any new lower extremity swelling or dizziness. If these develop, reduce back to 5 mg daily and let me know.

## 2023-05-24 NOTE — Telephone Encounter (Signed)
 Left a message for the patient to call back.

## 2023-05-24 NOTE — Telephone Encounter (Signed)
 Call placed back to the patient.   The patient stated that the blood pressure readings were from three different days. They were taken 1-2 hours after she had taken her medication.  125/78, HR 74 131/62, HR 77 130/62, HR 64 134/80, HR 70

## 2023-05-24 NOTE — Telephone Encounter (Signed)
 Yes please - amlodipine 10 mg daily.

## 2023-05-24 NOTE — Telephone Encounter (Signed)
 Provided that those readings are over a period of at least a few days/week, and not from just one day, I'd recommend that she increase amlodipine to 10 mg daily.  Please confirm the timeframe in which those four readings occurred w/ pt.

## 2023-05-24 NOTE — Telephone Encounter (Signed)
 Pt returning call to a nurse

## 2023-05-24 NOTE — Telephone Encounter (Signed)
 Returned the call to the patient. She stated that this afternoon her blood pressure was 112/52 and heart rate was 62.   She has been advised to keep a log of her blood pressures, 1-2 hours after she had taken her medication. She has also been educated on how to check her blood pressure and she was unsure. She will call back with an update in a few days. No changes were made at this time.

## 2023-05-25 NOTE — Telephone Encounter (Signed)
 Blood pressures acceptable.  Continue current medications.  If she has not yet increased the amlodipine to 10 mg, she can hold off based on his blood pressures.  Agree with limiting blood pressure checks to not more than twice a day.

## 2023-05-25 NOTE — Telephone Encounter (Signed)
 Left a message for the patient to call back to follow up on her blood pressure readings from today

## 2023-05-25 NOTE — Telephone Encounter (Signed)
 Call placed to the patient to check how her blood pressure was doing.   Before medications with 10 mins in between: 115/60 and heart rate 67  105/54 and heart rate 64 99/54 with heart rate 54  The patient has been advised to not take her blood pressure so frequently as this will call abnormal results and to take it once 1-2 hours after she had taken her medication. She has been taking her blood pressure multiple times per hour.   Blood pressure while on the phone: 120/62

## 2023-07-31 ENCOUNTER — Other Ambulatory Visit: Payer: Self-pay

## 2023-07-31 MED ORDER — AMLODIPINE BESYLATE 5 MG PO TABS: 5.0000 mg | ORAL_TABLET | Freq: Every day | ORAL | 2 refills | Status: AC

## 2023-09-15 NOTE — Progress Notes (Unsigned)
 Cardiology Office Note    Date:  09/17/2023   ID:  Latoya Cordova, DOB 1945/12/27, MRN [id number]  PCP:  Bertrum Charlie CROME, MD  Cardiologist:  Evalene Lunger, MD  Electrophysiologist:  None   Chief Complaint: Follow up  History of Present Illness:   Latoya Cordova is a 78 y.o. female with history of HFpEF, recently diagnosed severe ascending thoracic aortic aneurysm measuring 6.4 cm by CTA in 03/2023, AAA measuring 3.9 cm in 03/2023, HTN, HLD, obesity, and anxiety who presents for follow up of ascending thoracic aortic aneurysm.   Prior echo from 08/2014 showed an EF of 50-55% and mild mitral regurgitation. She was admitted to the hospital in 03/2015 with acute respiratory distress felt to be secondary to viral pneumonitis vs PNA. Echo at that time showed an EF of 60-65%, mild focal and moderate concentric LVH, LVOT gradient of 41 mmHg with Valsalva, Gr1DD, mildly dilated ascending aorta measuring 37 mm, normal RVSF and PASP. CTA chest was negative for PE, unremarkable thoracic aorta, and there were no significant coronary artery calcifications or aortic atherosclerosis noted. She was treated with Levaquin  with symptom improvement. She was seen virtually in 10/2018, noting chronic SOB, due to weight, and was taking Lasix  every other day. There was high fluid intake and it was noted she would eat when bored. No changes were made at that time.  She did have a mechanical fall on 01/20/2020, while squatting down to put flowers on her husband's grave and scraped her knee. No LOC and she did not hit her head.  She was seen in the office on 01/28/2020 and was doing well from a cardiac perspective.  She noted a 1 to 2-year history of progressive exertional dyspnea.  She did feel like her sedentary lifestyle was contributing to this.  Echo on 02/11/2020 showed an EF of 55 to 60%, no regional wall motion abnormalities, grade 1 diastolic dysfunction, normal RV systolic function, estimated right atrial pressure  of 3 mmHg, and no significant valvular abnormalities.  She was seen in the office on 02/24/2020 and was doing well from a cardiac perspective.  She continued to note a stable 1 to 2-year history of progressive exertional dyspnea without chest pain.  In this setting she underwent Lexiscan  MPI on 03/01/2020 which showed no significant ischemia or scar and was overall low risk.  CT attenuation corrected images showed evidence of aortic atherosclerosis with minimal coronary artery calcification.     She was seen in the office in 02/2023 noting an increase in shortness of breath from baseline chronic dyspnea.  She was without symptoms of frank chest pain.  At that time, she was scheduled for myocardial PET/CT and echo.  Subsequent echo on 03/27/2023 showed an EF of 60 to 65%, no regional wall motion abnormalities, grade 1 diastolic dysfunction, normal RV systolic function, poorly visualized aortic valve with mild aortic insufficiency, and a severe dilatation of the ascending aorta measuring 58 mm along with borderline dilatation of the aortic root measuring 38 mm (aorta documented to be normal in size and structure by echo in 01/2020).  In this setting, myocardial PET/CT was canceled with recommendation to proceed with [facility] following CTA of aortic aneurysm.  Subsequent CTA chest, abdomen, pelvis on 04/10/2023 showed ascending thoracic aortic aneurysm measuring up to 6.4 cm (aorta still 5 cm at the takeoff of the innominate artery and does not decrease to normal size until beyond the left subclavian artery), abdominal aortic aneurysm measuring 3.9 cm, aortic atherosclerosis,  cardiomegaly, and colonic diverticulosis without evidence of diverticulitis.  Subsequent review of imaging from 2021 showed an ascending thoracic aortic aneurysm measuring 5 to 5.5 cm at that time.  R/LHC on 04/25/2023 showed mild to moderate, nonobstructive CAD with up to 40 to 50% mid LAD stenosis and 20% disease involving proximal segment of a  large ramus intermedius branch.  LCx and RCA were without significant disease.  Upper normal left heart filling pressure, mildly elevated right heart and pulmonary pressure, and normal cardiac output/index.  She was evaluated by CVTS on 2//2025 with concern for prohibitive surgical repair risk in the setting of the magnitude of repair required since the aneurysm extended out into the aortic arch and in the context of her chronic shortness of breath with resting hypoxia.  Subsequent PFTs demonstrated severe obstructive and restrictive defect with at least moderate decrease in diffusion capacity.  Prior ABG demonstrated resting hypercarbia and hypoxemia felt to be due to a combination of intrinsic lung disease as well as obesity and body habitus.  She followed up with CVTS and was deemed to not be a surgical candidate.  She was last seen in our office in 04/2023 chronic dyspnea noted to be stable, reported she was relieved when she was declined for surgical repair though was interested in pursuing a second opinion.  She comes in doing well from a cardiac perspective and remains without symptoms of angina or cardiac decompensation.  Chronic dyspnea is stable.  More sedentary at baseline now.  Blood pressure has remained well-controlled at home in the low 100s to 1 teens systolic without dizziness, near-syncope, or syncope.  Not currently interested in pursuing further surgical opinion regarding her aneurysm.  Does not want to move forward with the surgery and understands that should her aneurysm rupture, this would be fatal.   Labs independently reviewed: 03/2023 - BUN 19, serum creatinine 1, potassium 3.9, Hgb 14.4, PLT 143 03/2022 - A1c 6.1 07/2022 - albumin 4.2, AST/ALT normal, TSH normal 08/2021 - TC 141, TG 99, HDL 52, LDL 71  Past Medical History:  Diagnosis Date   Anxiety    Diastolic dysfunction    a. echo 08/2014: EF 50-55%, mild MR; b. echo 03/2015: EF 60-65%, RWMA could not be excluded, GR1DD,  ascending aorta mildly dilated at 37mm, normal right-sided pressure   Hyperlipidemia    Hypertension    Morbid obesity (HCC)     Past Surgical History:  Procedure Laterality Date   ABDOMINAL HYSTERECTOMY  08/18/2004   APPENDECTOMY     CATARACT EXTRACTION Bilateral    RIGHT HEART CATH AND CORONARY ANGIOGRAPHY Bilateral 04/25/2023   Procedure: RIGHT HEART CATH AND CORONARY ANGIOGRAPHY;  Surgeon: Mady Bruckner, MD;  Location: [facility] INVASIVE CV LAB;  Service: Cardiovascular;  Laterality: Bilateral;    Current Medications: Current Meds  Medication Sig   amLODipine  (NORVASC ) 5 MG tablet Take 1 tablet (5 mg total) by mouth daily.   aspirin  EC 81 MG tablet Take 1 tablet (81 mg total) by mouth daily. Swallow whole.   brimonidine (ALPHAGAN) 0.2 % ophthalmic solution Place 1 drop into both eyes 2 (two) times daily.   buPROPion  (WELLBUTRIN  XL) 300 MG 24 hr tablet Take 300 mg by mouth daily.   butalbital -aspirin -caffeine  (FIORINAL ) 50-325-40 MG capsule Take 1 capsule by mouth 2 (two) times daily as needed for headache.   Cholecalciferol (VITAMIN D ) 50 MCG (2000 UT) CAPS Take 2,000 Units by mouth daily.   citalopram (CELEXA) 10 MG tablet Take 10 mg by mouth daily.  Cyanocobalamin  (B-12 PO) Take by mouth daily.   hydrochlorothiazide  (HYDRODIURIL ) 25 MG tablet Take 25 mg by mouth daily.   HYDROcodone -acetaminophen  (NORCO/VICODIN) 5-325 MG tablet Take 1-2 tablets by mouth every 6 (six) hours as needed for moderate pain.   levothyroxine  (SYNTHROID ) 25 MCG tablet Take 1 tablet (25 mcg total) by mouth daily.   LORazepam  (ATIVAN ) 0.5 MG tablet TAKE ONE TABLET BY MOUTH TWICE A DAY AS NEEDED (Patient taking differently: Take 0.5 mg by mouth daily. Can take an additional dose if needed for anxiety)   losartan (COZAAR) 50 MG tablet Take 50 mg by mouth daily.   rosuvastatin  (CRESTOR ) 10 MG tablet TAKE ONE TABLET BY MOUTH ONE TIME DAILY    Allergies:   Sulfa antibiotics and Lodine  [etodolac]   Social  History   Socioeconomic History   Marital status: Widowed    Spouse name: Not on file   Number of children: 1   Years of education: Not on file   Highest education level: Associate degree: occupational, Scientist, product/process development, or vocational program  Occupational History   Occupation: retired  Tobacco Use   Smoking status: Never   Smokeless tobacco: Never  Vaping Use   Vaping status: Never Used  Substance and Sexual Activity   Alcohol use: No   Drug use: No   Sexual activity: Not on file  Other Topics Concern   Not on file  Social History Narrative   Not on file   Social Drivers of Health   Financial Resource Strain: High Risk (02/02/2023)   Received from [facility]   Overall Financial Resource Strain (CARDIA)    Difficulty of Paying Living Expenses: Hard  Food Insecurity: Food Insecurity Present (02/02/2023)   Received from [facility]   Hunger Vital Sign    Within the past 12 months, you worried that your food would run out before you got the money to buy more.: Sometimes true    Within the past 12 months, the food you bought just didn't last and you didn't have money to get more.: Sometimes true  Transportation Needs: No Transportation Needs (02/02/2023)   Received from [facility] - Transportation    In the past 12 months, has lack of transportation kept you from medical appointments or from getting medications?: No    Lack of Transportation (Non-Medical): No  Physical Activity: Inactive (02/12/2023)   Received from [facility]   Exercise Vital Sign    On average, how many days per week do you engage in moderate to strenuous exercise (like a brisk walk)?: 0 days    On average, how many minutes do you engage in exercise at this level?: 0 min  Stress: Stress Concern Present (02/12/2023)   Received from [facility] - Occupational Stress  Questionnaire    Feeling of Stress : Very much  Social Connections: Socially Isolated (02/12/2023)   Received from [facility]   Social Connection and Isolation Panel    In a typical week, how many times do you talk on the phone with family, friends, or neighbors?: More than three times a week    How often do you get together with friends or relatives?: Never    How often do you attend church or religious services?: Never    Do you belong to any clubs or organizations such as church groups, unions, fraternal or athletic groups, or school  groups?: No    How often do you attend meetings of the clubs or organizations you belong to?: Never    Are you married, widowed, divorced, separated, never married, or living with a partner?: Widowed     Family History:  The patient's family history includes Breast cancer in her cousin and maternal aunt; Congestive Heart Failure in her mother; Heart attack in her paternal grandfather; Hypertension in her mother; Ulcers in her father.  ROS:   12-point review of systems is negative unless otherwise noted in the HPI.   EKGs/Labs/Other Studies Reviewed:    Studies reviewed were summarized above. The additional studies were reviewed today:  2D echo 08/2014: - Left ventricle: The cavity size was normal. Systolic function was    normal. The estimated ejection fraction was in the range of 50%    to 55%.  - Mitral valve: There was mild regurgitation. __________   2D echo 03/2015: - Left ventricle: The cavity size was normal. There was mild focal    basal and moderate concentric hypertrophy of the septum. There is    LVOT gradient of 41 mm Hg with valsalva (not well visualized)    Systolic function was normal. The estimated ejection fraction was    in the range of 60% to 65%. Regional wall motion abnormalities    cannot be excluded. Doppler parameters are consistent with    abnormal left ventricular relaxation (grade 1 diastolic     dysfunction).  - Aorta: Ascending aorta is mildly dilated, diameter: 37 mm (S).  - Left atrium: The atrium was normal in size.  - Right ventricle: Systolic function was normal.  - Pulmonary arteries: Systolic pressure was within the normal    range.   Impressions:   - Challenging image quality. __________   2D echo 02/11/2020: 1. Left ventricular ejection fraction, by estimation, is 55 to 60%. The  left ventricle has normal function. The left ventricle has no regional  wall motion abnormalities. Left ventricular diastolic parameters are  consistent with Grade I diastolic  dysfunction (impaired relaxation).   2. Right ventricular systolic function is normal. The right ventricular  size is not well visualized.   3. The mitral valve is grossly normal. No evidence of mitral valve  regurgitation.   4. The aortic valve was not well visualized. Aortic valve regurgitation  not assessed.   5. The inferior vena cava is normal in size with greater than 50%  respiratory variability, suggesting right atrial pressure of 3 mmHg. __________   Lexiscan  MPI 03/01/2020: There was no ST segment deviation noted during stress. No T wave inversion was noted during stress. The study is normal. This is a low risk study. The left ventricular ejection fraction is hyperdynamic (>65%). CT attenuation images show evidence of aortic calcifications but minimal coronary calcifications. __________   2D echo 03/27/2023: 1. Left ventricular ejection fraction, by estimation, is 60 to 65%. The  left ventricle has normal function. The left ventricle has no regional  wall motion abnormalities. Left ventricular diastolic parameters are  consistent with Grade I diastolic  dysfunction (impaired relaxation).   2. Right ventricular systolic function is normal. The right ventricular  size is not well visualized.   3. The mitral valve is grossly normal. No evidence of mitral valve  regurgitation.   4. The aortic  valve was not well visualized. Aortic valve regurgitation  is mild.   5. Aortic dilatation noted. There is severe dilatation of the ascending  aorta, measuring 58 mm. There  is borderline dilatation of the aortic root,  measuring 38 mm.   Conclusion(s)/Recommendation(s): Severe ascending aorta dilation, 58 mm in  diameter. Urgent referal to CT surgery placed. CT angio chest and abdomen  to evaluate aorta also placed.  __________   CTA chest, abdomen, pelvis 04/14/2023: IMPRESSION: Vascular:   1. Ascending thoracic aortic aneurysm measuring up to 6.4 cm in maximal diameter. Recommend cardiothoracic/vascular surgery referral if not already obtained.  2. Abdominal aortic aneurysm measuring 3.9 cm. Recommend surveillance ultrasound in 3 years. Reference: Journal of Vascular Surgery 67.1 (2018): 2-77. J Am Coll Radiol 2013;10:789-794. 3.  Aortic Atherosclerosis (ICD10-I70.0).   Nonvascular:   1. Colonic diverticulosis without diverticulitis. 2. Cardiomegaly. __________   [facility] 04/25/2023: Conclusions: Mild to moderate, nonobstructive coronary artery disease with up to 40-50% mid LAD stenosis and 20% disease involving proximal segment of large ramus intermedius branch.  LCx and RCA are without significant disease. Upper normal left heart filling pressure (PCWP 17 mmHg). Mildly elevated right heart and pulmonary artery pressures (mean RA/RV EDP 10 mmHg, PA 40/15 mmHg, mean PA 23 mmHg). Normal Fick cardiac output/index (CO 5.7 L/min, CI 2.9 L/min/m). Tortuous right radial artery precluding catheterization from this approach.  Query vascular malformation versus IV fistula from remote IV placement in the antecubital fossa based on sheath angiogram and physical exam.  Alternative access recommended for future catheterizations if necessary.   Recommendations: Continue secondary prevention of ASCVD. Proceed with cardiac surgery evaluation for further management of severely dilated ascending  aorta. Consider nonemergent duplex of right radial artery/antecubital fossa.   EKG:  EKG is not ordered today.   Recent Labs: 04/17/2023: BUN 19; Creatinine, Ser 1.00; Platelets 143 04/25/2023: Hemoglobin 13.9; Potassium 3.8; Sodium 139  Recent Lipid Panel    Component Value Date/Time   CHOL 141 09/02/2021 1100   TRIG 99 09/02/2021 1100   HDL 52 09/02/2021 1100   CHOLHDL 2.7 09/02/2021 1100   CHOLHDL 3.8 02/19/2017 1445   LDLCALC 71 09/02/2021 1100   LDLCALC 109 (H) 02/19/2017 1445   LDLDIRECT 104 (H) 04/22/2020 1411    PHYSICAL EXAM:    VS:  BP (!) 100/58 (BP Location: Right Arm, Patient Position: Sitting, Cuff Size: Large)   Pulse 89   Ht 5' 3 (1.6 m)   Wt 207 lb (93.9 kg)   SpO2 97%   BMI 36.67 kg/m   BMI: Body mass index is 36.67 kg/m.  Physical Exam Constitutional:      Appearance: She is well-developed.  HENT:     Head: Normocephalic and atraumatic.   Eyes:     General:        Right eye: No discharge.        Left eye: No discharge.    Cardiovascular:     Rate and Rhythm: Normal rate and regular rhythm.     Heart sounds: Normal heart sounds, S1 normal and S2 normal. Heart sounds not distant. No midsystolic click and no opening snap. No murmur heard.    No friction rub.  Pulmonary:     Effort: Pulmonary effort is normal. No respiratory distress.     Breath sounds: Normal breath sounds. No decreased breath sounds, wheezing, rhonchi or rales.  Chest:     Chest wall: No tenderness.   Musculoskeletal:     Cervical back: Normal range of motion.   Skin:    General: Skin is warm and dry.     Nails: There is no clubbing.   Neurological:     Mental Status:  She is alert and oriented to person, place, and time.   Psychiatric:        Speech: Speech normal.        Behavior: Behavior normal.        Thought Content: Thought content normal.        Judgment: Judgment normal.     Wt Readings from Last 3 Encounters:  09/17/23 207 lb (93.9 kg)  05/18/23 206  lb 12.8 oz (93.8 kg)  05/14/23 206 lb (93.4 kg)     ASSESSMENT & PLAN:   Severe ascending thoracic aortic aneurysm: Noted on echo in 02/2023 measuring 5.8 cm. Follow-up CTA chest, abdomen, pelvis, showed ascending thoracic aortic aneurysm measuring 6.4 cm with the aorta still measuring 5 cm at the takeoff of the innominate artery and does not decrease to normal size until beyond the left subclavian artery. Upon review of echo and CTA images from Lexiscan  MPI in 2021, ascending thoracic aorta was measuring 5 to 5.5 cm at that time.  Evaluated by CVTS with concern regarding surgical risk in the context of magnitude of surgical repair required, age, deconditioning, and chronic shortness of breath and hypoxia.  PFTs demonstrated severe obstructive and restrictive defect with at least moderate decrease in diffusion capacity.  Not felt to be a surgical candidate with CVTS.  Prefers to follow-up locally rather than traveling to [place].  Therefore, we will repeat CTA aorta as recommended by CVTS in 10/2023.  Does not wish to pursue second opinion regarding surgical intervention as she indicates she would not move forward with surgery regardless.  She understands that should her aneurysm rupture, this would be fatal.  Precautions discussed.  Nonobstructive CAD: No symptoms suggestive of angina or cardiac decompensation.  LHC in 03/2023 showed mild to moderate nonobstructive CAD as outlined above.  Continue aspirin  81 mg and rosuvastatin  10 mg.  No indication for further ischemic testing at this time.  HFpEF with chronic dyspnea: Without decompensation.  Recent PFTs concerning for obstructive and restrictive pulmonary disease.  Defer addition of SGLT2 inhibitor given concern for off target effect in the setting of body habitus.  Not currently taking furosemide , though is taking HCTZ 25 mg daily.  Regarding pulmonary disease, ongoing management per PCP.  Check renal function and electrolytes.  AAA: Incidentally  noted on CTA, measuring 3.9 cm.  Follow-up imaging in 03/2024.  HTN: Blood pressure is well-controlled in the office today and at home.  She remains on amlodipine  5 mg, HCTZ 25 mg, and losartan 50 mg.  HLD: LDL 71.  She remains on rosuvastatin  10 mg.  Check liver function lipid panel.     Disposition: F/u with Dr. Gollan or an APP in 3 months.   Medication Adjustments/Labs and Tests Ordered: Current medicines are reviewed at length with the patient today.  Concerns regarding medicines are outlined above. Medication changes, Labs and Tests ordered today are summarized above and listed in the Patient Instructions accessible in Encounters.   Signed, Bernardino Bring, PA-C 09/17/2023 12:45 PM     [facility] [place] [address] [place], [place] [address] [phone]

## 2023-09-17 ENCOUNTER — Ambulatory Visit: Payer: PPO | Attending: Physician Assistant | Admitting: Physician Assistant

## 2023-09-17 ENCOUNTER — Encounter: Payer: Self-pay | Admitting: Physician Assistant

## 2023-09-17 VITALS — BP 100/58 | HR 89 | Ht 63.0 in | Wt 207.0 lb

## 2023-09-17 DIAGNOSIS — I7121 Aneurysm of the ascending aorta, without rupture: Secondary | ICD-10-CM | POA: Diagnosis not present

## 2023-09-17 DIAGNOSIS — I714 Abdominal aortic aneurysm, without rupture, unspecified: Secondary | ICD-10-CM | POA: Diagnosis not present

## 2023-09-17 DIAGNOSIS — Z79899 Other long term (current) drug therapy: Secondary | ICD-10-CM

## 2023-09-17 DIAGNOSIS — I5032 Chronic diastolic (congestive) heart failure: Secondary | ICD-10-CM

## 2023-09-17 DIAGNOSIS — I2510 Atherosclerotic heart disease of native coronary artery without angina pectoris: Secondary | ICD-10-CM | POA: Diagnosis not present

## 2023-09-17 DIAGNOSIS — I10 Essential (primary) hypertension: Secondary | ICD-10-CM

## 2023-09-17 DIAGNOSIS — E785 Hyperlipidemia, unspecified: Secondary | ICD-10-CM

## 2023-09-17 NOTE — Patient Instructions (Signed)
 Medication Instructions:  Your physician recommends that you continue on your current medications as directed. Please refer to the Current Medication list given to you today.   *If you need a refill on your cardiac medications before your next appointment, please call your pharmacy*  Lab Work: Your provider would like for you to have following labs drawn today CMeT and Lipid panel.   If you have labs (blood work) drawn today and your tests are completely normal, you will receive your results only by: MyChart Message (if you have MyChart) OR A paper copy in the mail If you have any lab test that is abnormal or we need to change your treatment, we will call you to review the results.  Testing/Procedures: CT Angiography (CTA) chest/aorta, is a special type of CT scan that uses a computer to produce multi-dimensional views of major blood vessels throughout the body. In CT angiography, a contrast material is injected through an IV to help visualize the blood vessels  Nothing to eat or drink 4 hours prior to test  [facility] [address]  [place], [place] [address]   Follow-Up: At [facility], you and your health needs are our priority.  As part of our continuing mission to provide you with exceptional heart care, our providers are all part of one team.  This team includes your primary Cardiologist (physician) and Advanced Practice Providers or APPs (Physician Assistants and Nurse Practitioners) who all work together to provide you with the care you need, when you need it.  Your next appointment:   3 month(s)  Provider:   You may see Timothy Gollan, MD or Bernardino Bring, PA-C We recommend signing up for the patient portal called MyChart.  Sign up information is provided on this After Visit Summary.  MyChart is used to connect with patients for Virtual Visits (Telemedicine).  Patients are able to view lab/test results, encounter notes, upcoming  appointments, etc.  Non-urgent messages can be sent to your provider as well.   To learn more about what you can do with MyChart, go to [email or web address].

## 2023-09-18 ENCOUNTER — Ambulatory Visit: Payer: Self-pay | Admitting: Physician Assistant

## 2023-09-18 LAB — COMPREHENSIVE METABOLIC PANEL WITH GFR
ALT: 11 IU/L (ref 0–32)
AST: 16 IU/L (ref 0–40)
Albumin: 4.4 g/dL (ref 3.8–4.8)
Alkaline Phosphatase: 63 IU/L (ref 44–121)
BUN/Creatinine Ratio: 20 (ref 12–28)
BUN: 18 mg/dL (ref 8–27)
Bilirubin Total: 0.7 mg/dL (ref 0.0–1.2)
CO2: 24 mmol/L (ref 20–29)
Calcium: 9.5 mg/dL (ref 8.7–10.3)
Chloride: 99 mmol/L (ref 96–106)
Creatinine, Ser: 0.92 mg/dL (ref 0.57–1.00)
Globulin, Total: 2.7 g/dL (ref 1.5–4.5)
Glucose: 84 mg/dL (ref 70–99)
Potassium: 4.2 mmol/L (ref 3.5–5.2)
Sodium: 138 mmol/L (ref 134–144)
Total Protein: 7.1 g/dL (ref 6.0–8.5)
eGFR: 63 mL/min/{1.73_m2} (ref >59)

## 2023-09-18 LAB — LIPID PANEL
Chol/HDL Ratio: 2.1 ratio (ref 0.0–4.4)
Cholesterol, Total: 109 mg/dL (ref 100–199)
HDL: 53 mg/dL (ref >39)
LDL Chol Calc (NIH): 40 mg/dL (ref 0–99)
Triglycerides: 81 mg/dL (ref 0–149)
VLDL Cholesterol Cal: 16 mg/dL (ref 5–40)

## 2023-09-18 NOTE — Telephone Encounter (Signed)
 The patient has been notified (message per DPR) of the results along with recommendations.  Pt reminded of upcoming appt and encouraged to call if any concerns or questions arise

## 2023-09-26 ENCOUNTER — Telehealth: Payer: Self-pay | Admitting: Emergency Medicine

## 2023-09-26 ENCOUNTER — Telehealth: Payer: Self-pay | Admitting: Physician Assistant

## 2023-09-26 NOTE — Telephone Encounter (Signed)
 Pt is having low back pain since last night and is concerned it may be related to aneurysm, requesting cb

## 2023-09-26 NOTE — Telephone Encounter (Signed)
 While symptoms do sound somewhat atypical for aneurysm given that it is painful to touch, other symptoms are concerning including sharp, knife-like pain.  I have recommended she go to the nearest ER for further evaluation and imaging, as it is difficult to say what this is over the phone.

## 2023-09-26 NOTE — Telephone Encounter (Signed)
 Pt reports left FLANK PAIN HAS SUBSIDED TO 4/10 last Norco (5/325) was at 0500 this morning, pt reports completely normal BM and urination today (flow and color)  Pt reports son examined area and denies rash or discoloration, pt thinks the tender to touch comes from roughly massaging the area with her knuckle  Pt declining ED visit, pt cautioned about Norco masking further s/sx, educated on abdominal pain and presentation of sepsis, pt reports that she will call 911 or go to ED if symptoms worsen, pt voiced appreciation for ED precautions

## 2023-09-26 NOTE — Telephone Encounter (Signed)
 Called patient  Latoya Cordova, above waist on left side, took pain medication ( norco 5-325) - helped with sleep last night, took second pain pill this morning at 5 am, painful to touch, heat/ice with no results, rates pain 8/10 currently, sharp knife like pain, spasm last night, now constant, no pain with urination or bowel movement  Patient is concerned that this could be her aneurysm   Please advise - A

## 2023-09-26 NOTE — Telephone Encounter (Signed)
 Pt called back and reports pain is about the same, denies further meds, reports when standing and walking the pain just about goes away  Pt repeated ED precautions to this RN, and pt appreciative of the call

## 2023-09-27 ENCOUNTER — Other Ambulatory Visit: Payer: Self-pay | Admitting: Surgery

## 2023-09-27 DIAGNOSIS — I7121 Aneurysm of the ascending aorta, without rupture: Secondary | ICD-10-CM

## 2023-11-08 ENCOUNTER — Ambulatory Visit (HOSPITAL_COMMUNITY)

## 2023-11-08 ENCOUNTER — Ambulatory Visit
Admission: RE | Admit: 2023-11-08 | Discharge: 2023-11-08 | Disposition: A | Discharge status: Home / Self Care | Source: Ambulatory Visit | Attending: Physician Assistant | Admitting: Physician Assistant

## 2023-11-08 DIAGNOSIS — I7121 Aneurysm of the ascending aorta, without rupture: Secondary | ICD-10-CM | POA: Diagnosis present

## 2023-11-08 MED ORDER — IOHEXOL 350 MG/ML SOLN
100.0000 mL | Freq: Once | INTRAVENOUS | Status: AC | PRN
  Administered 2023-11-08: 100 mL via INTRAVENOUS

## 2023-11-14 ENCOUNTER — Ambulatory Visit: Admitting: Surgery

## 2023-11-14 ENCOUNTER — Encounter: Payer: Self-pay | Admitting: Surgery

## 2023-11-24 ENCOUNTER — Other Ambulatory Visit: Payer: Self-pay | Admitting: Cardiovascular Disease

## 2023-12-18 ENCOUNTER — Ambulatory Visit: Attending: Physician Assistant | Admitting: Physician Assistant

## 2023-12-18 ENCOUNTER — Encounter: Payer: Self-pay | Admitting: Physician Assistant

## 2023-12-18 VITALS — BP 100/50 | HR 87 | Ht 63.0 in | Wt 209.0 lb

## 2023-12-18 DIAGNOSIS — I10 Essential (primary) hypertension: Secondary | ICD-10-CM

## 2023-12-18 DIAGNOSIS — I7121 Aneurysm of the ascending aorta, without rupture: Secondary | ICD-10-CM

## 2023-12-18 DIAGNOSIS — I5032 Chronic diastolic (congestive) heart failure: Secondary | ICD-10-CM | POA: Diagnosis not present

## 2023-12-18 DIAGNOSIS — I2510 Atherosclerotic heart disease of native coronary artery without angina pectoris: Secondary | ICD-10-CM | POA: Diagnosis not present

## 2023-12-18 DIAGNOSIS — I714 Abdominal aortic aneurysm, without rupture, unspecified: Secondary | ICD-10-CM

## 2023-12-18 DIAGNOSIS — E785 Hyperlipidemia, unspecified: Secondary | ICD-10-CM

## 2023-12-18 NOTE — Patient Instructions (Signed)
 Medication Instructions:  Your physician recommends that you continue on your current medications as directed. Please refer to the Current Medication list given to you today.   *If you need a refill on your cardiac medications before your next appointment, please call your pharmacy*  Lab Work: None ordered at this time  If you have labs (blood work) drawn today and your tests are completely normal, you will receive your results only by: MyChart Message (if you have MyChart) OR A paper copy in the mail If you have any lab test that is abnormal or we need to change your treatment, we will call you to review the results.  Testing/Procedures: CT Angiogram - scan of Chest, abdomen, and pelvis - this will be scheduled by us  and done in the [facility]   Follow-Up: At [facility], you and your health needs are our priority.  As part of our continuing mission to provide you with exceptional heart care, our providers are all part of one team.  This team includes your primary Cardiologist (physician) and Advanced Practice Providers or APPs (Physician Assistants and Nurse Practitioners) who all work together to provide you with the care you need, when you need it.  Your next appointment:   6 month(s) (approx 3 weeks after your CTA)  Provider:   You may see Timothy Gollan, MD or Bernardino Bring, PA-C  We recommend signing up for the patient portal called MyChart.  Sign up information is provided on this After Visit Summary.  MyChart is used to connect with patients for Virtual Visits (Telemedicine).  Patients are able to view lab/test results, encounter notes, upcoming appointments, etc.  Non-urgent messages can be sent to your provider as well.   To learn more about what you can do with MyChart, go to [email or web address].

## 2023-12-18 NOTE — Progress Notes (Signed)
 Cardiology Office Note    Date:  12/18/2023   ID:  Latoya Cordova, DOB 1945/04/21, MRN [id number]  PCP:  Bertrum Charlie CROME, MD  Cardiologist:  Evalene Lunger, MD  Electrophysiologist:  None   Chief Complaint: Follow-up  History of Present Illness:   Latoya Cordova is a 78 y.o. female with history of HFpEF, severe ascending thoracic aortic aneurysm measuring 6.3 cm by CTA in 10/2023 felt to not be a surgical candidate, AAA measuring 4 cm in 10/2023, HTN, HLD, obesity, and anxiety who presents for follow up of ascending thoracic aortic aneurysm.   Prior echo from 08/2014 showed an EF of 50-55% and mild mitral regurgitation. She was admitted to the hospital in 03/2015 with acute respiratory distress felt to be secondary to viral pneumonitis vs PNA. Echo at that time showed an EF of 60-65%, mild focal and moderate concentric LVH, LVOT gradient of 41 mmHg with Valsalva, Gr1DD, mildly dilated ascending aorta measuring 37 mm, normal RVSF and PASP. CTA chest was negative for PE, unremarkable thoracic aorta, and there were no significant coronary artery calcifications or aortic atherosclerosis noted. She was treated with Levaquin  with symptom improvement. She was seen virtually in 10/2018, noting chronic SOB, due to weight, and was taking Lasix  every other day. There was high fluid intake and it was noted she would eat when bored. No changes were made at that time.  She did have a mechanical fall on 01/20/2020, while squatting down to put flowers on her husband's grave and scraped her knee. No LOC and she did not hit her head.  She was seen in the office on 01/28/2020 and was doing well from a cardiac perspective.  She noted a 1 to 2-year history of progressive exertional dyspnea.  She did feel like her sedentary lifestyle was contributing to this.  Echo on 02/11/2020 showed an EF of 55 to 60%, no regional wall motion abnormalities, grade 1 diastolic dysfunction, normal RV systolic function, estimated right  atrial pressure of 3 mmHg, and no significant valvular abnormalities.  She was seen in the office on 02/24/2020 and was doing well from a cardiac perspective.  She continued to note a stable 1 to 2-year history of progressive exertional dyspnea without chest pain.  In this setting she underwent Lexiscan  MPI on 03/01/2020 which showed no significant ischemia or scar and was overall low risk.  CT attenuation corrected images showed evidence of aortic atherosclerosis with minimal coronary artery calcification.     She was seen in the office in 02/2023 noting an increase in shortness of breath from baseline chronic dyspnea.  She was without symptoms of frank chest pain.  At that time, she was scheduled for myocardial PET/CT and echo.  Subsequent echo on 03/27/2023 showed an EF of 60 to 65%, no regional wall motion abnormalities, grade 1 diastolic dysfunction, normal RV systolic function, poorly visualized aortic valve with mild aortic insufficiency, and a severe dilatation of the ascending aorta measuring 58 mm along with borderline dilatation of the aortic root measuring 38 mm (aorta documented to be normal in size and structure by echo in 01/2020).  In this setting, myocardial PET/CT was canceled with recommendation to proceed with [facility] following CTA of aortic aneurysm.  Subsequent CTA chest, abdomen, pelvis on 04/10/2023 showed ascending thoracic aortic aneurysm measuring up to 6.4 cm (aorta still 5 cm at the takeoff of the innominate artery and does not decrease to normal size until beyond the left subclavian artery), abdominal aortic aneurysm measuring  3.9 cm, aortic atherosclerosis, cardiomegaly, and colonic diverticulosis without evidence of diverticulitis.  Subsequent review of imaging from 2021 showed an ascending thoracic aortic aneurysm measuring 5 to 5.5 cm at that time.  R/LHC on 04/25/2023 showed mild to moderate, nonobstructive CAD with up to 40 to 50% mid LAD stenosis and 20% disease involving proximal  segment of a large ramus intermedius branch.  LCx and RCA were without significant disease.  Upper normal left heart filling pressure, mildly elevated right heart and pulmonary pressure, and normal cardiac output/index.  She was evaluated by CVTS on 2//2025 with concern for prohibitive surgical repair risk in the setting of the magnitude of repair required since the aneurysm extended out into the aortic arch and in the context of her chronic shortness of breath with resting hypoxia.  Subsequent PFTs demonstrated severe obstructive and restrictive defect with at least moderate decrease in diffusion capacity.  Prior ABG demonstrated resting hypercarbia and hypoxemia felt to be due to a combination of intrinsic lung disease as well as obesity and body habitus.  She followed up with CVTS and was deemed to not be a surgical candidate.  She has declined second opinion.  CTA chest, abdomen, and pelvis in 10/2023 showed no thoracoabdominal aortic aneurysm, with a maximum measurement of 6.3 cm in the ascending thoracic aorta with a maximum diameter in the upper abdomen measuring 4 cm at the aortic hiatus, stable when compared to study from 03/2023.  She comes in continuing to do well from a cardiac perspective and remains without symptoms of angina or cardiac decompensation.  Chronic dyspnea is stable.  Largely sedentary at baseline.  Blood pressures remain well-controlled at home in the low 100s to 1 teens mmHg systolic.  Has some mild dizziness without near syncope or syncope.  No falls.  No symptoms concerning for bleeding.  Not interested in pursuing further surgical opinion.  She understands if her aortic aneurysm were to rupture this would be fatal.   Labs independently reviewed: 08/2023 - TC 109, TG 81, HDL 53, LDL 40, BUN 18, serum creatinine 0.92, potassium 4.2, albumin 4.4, AST/ALT normal 03/2023 - Hgb 14.4, PLT 143 03/2022 - A1c 6.1 07/2022 - TSH normal  Past Medical History:  Diagnosis Date   Anxiety     Diastolic dysfunction    a. echo 08/2014: EF 50-55%, mild MR; b. echo 03/2015: EF 60-65%, RWMA could not be excluded, GR1DD, ascending aorta mildly dilated at 37mm, normal right-sided pressure   Hyperlipidemia    Hypertension    Morbid obesity (HCC)     Past Surgical History:  Procedure Laterality Date   ABDOMINAL HYSTERECTOMY  08/18/2004   APPENDECTOMY     CATARACT EXTRACTION Bilateral    RIGHT HEART CATH AND CORONARY ANGIOGRAPHY Bilateral 04/25/2023   Procedure: RIGHT HEART CATH AND CORONARY ANGIOGRAPHY;  Surgeon: Mady Bruckner, MD;  Location: [facility] INVASIVE CV LAB;  Service: Cardiovascular;  Laterality: Bilateral;    Current Medications: Current Meds  Medication Sig   amLODipine  (NORVASC ) 5 MG tablet Take 1 tablet (5 mg total) by mouth daily.   aspirin  EC 81 MG tablet Take 1 tablet (81 mg total) by mouth daily. Swallow whole.   brimonidine (ALPHAGAN) 0.2 % ophthalmic solution Place 1 drop into both eyes 2 (two) times daily.   buPROPion  (WELLBUTRIN  XL) 300 MG 24 hr tablet Take 300 mg by mouth daily.   butalbital -aspirin -caffeine  (FIORINAL ) 50-325-40 MG capsule Take 1 capsule by mouth 2 (two) times daily as needed for headache.   citalopram (  CELEXA) 10 MG tablet Take 10 mg by mouth daily.   Cyanocobalamin  (B-12 PO) Take by mouth daily.   furosemide  (LASIX ) 20 MG tablet TAKE 1 TABLET(20 MG) BY MOUTH EVERY MORNING   hydrochlorothiazide  (HYDRODIURIL ) 25 MG tablet TAKE ONE TABLET BY MOUTH ONE TIME DAILY   HYDROcodone -acetaminophen  (NORCO/VICODIN) 5-325 MG tablet Take 1-2 tablets by mouth every 6 (six) hours as needed for moderate pain.   levothyroxine  (SYNTHROID ) 25 MCG tablet Take 1 tablet (25 mcg total) by mouth daily.   LORazepam  (ATIVAN ) 0.5 MG tablet TAKE ONE TABLET BY MOUTH TWICE A DAY AS NEEDED (Patient taking differently: Take 0.5 mg by mouth daily. Can take an additional dose if needed for anxiety)   losartan (COZAAR) 50 MG tablet Take 50 mg by mouth daily.   PARoxetine  (PAXIL )  40 MG tablet TAKE ONE TABLET BY MOUTH ONE TIME DAILY **MUST CALL DR. FOR APPOINTMENT FOR FURTHER REFILLS**   rosuvastatin  (CRESTOR ) 10 MG tablet TAKE ONE TABLET BY MOUTH ONE TIME DAILY    Allergies:   Sulfa antibiotics and Lodine  [etodolac]   Social History   Socioeconomic History   Marital status: Widowed    Spouse name: Not on file   Number of children: 1   Years of education: Not on file   Highest education level: Associate degree: occupational, Scientist, product/process development, or vocational program  Occupational History   Occupation: retired  Tobacco Use   Smoking status: Never   Smokeless tobacco: Never  Vaping Use   Vaping status: Never Used  Substance and Sexual Activity   Alcohol use: No   Drug use: No   Sexual activity: Not on file  Other Topics Concern   Not on file  Social History Narrative   Not on file   Social Drivers of Health   Financial Resource Strain: High Risk (02/02/2023)   Received from [facility]   Overall Financial Resource Strain (CARDIA)    Difficulty of Paying Living Expenses: Hard  Food Insecurity: Food Insecurity Present (02/02/2023)   Received from [facility]   Hunger Vital Sign    Within the past 12 months, you worried that your food would run out before you got the money to buy more.: Sometimes true    Within the past 12 months, the food you bought just didn't last and you didn't have money to get more.: Sometimes true  Transportation Needs: No Transportation Needs (02/02/2023)   Received from [facility] - Transportation    In the past 12 months, has lack of transportation kept you from medical appointments or from getting medications?: No    Lack of Transportation (Non-Medical): No  Physical Activity: Inactive (02/12/2023)   Received from [facility]   Exercise Vital Sign    On average, how many days per week do you engage in moderate to strenuous exercise (like a brisk  walk)?: 0 days    On average, how many minutes do you engage in exercise at this level?: 0 min  Stress: Stress Concern Present (02/12/2023)   Received from [facility] - Occupational Stress Questionnaire    Feeling of Stress : Very much  Social Connections: Socially Isolated (02/12/2023)   Received from [facility]   Social Connection and Isolation Panel    In a typical week, how many times do you talk on the phone with family, friends, or neighbors?: More than three  times a week    How often do you get together with friends or relatives?: Never    How often do you attend church or religious services?: Never    Do you belong to any clubs or organizations such as church groups, unions, fraternal or athletic groups, or school groups?: No    How often do you attend meetings of the clubs or organizations you belong to?: Never    Are you married, widowed, divorced, separated, never married, or living with a partner?: Widowed     Family History:  The patient's family history includes Breast cancer in her cousin and maternal aunt; Congestive Heart Failure in her mother; Heart attack in her paternal grandfather; Hypertension in her mother; Ulcers in her father.  ROS:   12-point review of systems is negative unless otherwise noted in the HPI.   EKGs/Labs/Other Studies Reviewed:    Studies reviewed were summarized above. The additional studies were reviewed today:  2D echo 08/2014: - Left ventricle: The cavity size was normal. Systolic function was    normal. The estimated ejection fraction was in the range of 50%    to 55%.  - Mitral valve: There was mild regurgitation. __________   2D echo 03/2015: - Left ventricle: The cavity size was normal. There was mild focal    basal and moderate concentric hypertrophy of the septum. There is    LVOT gradient of 41 mm Hg with valsalva (not well visualized)    Systolic  function was normal. The estimated ejection fraction was    in the range of 60% to 65%. Regional wall motion abnormalities    cannot be excluded. Doppler parameters are consistent with    abnormal left ventricular relaxation (grade 1 diastolic    dysfunction).  - Aorta: Ascending aorta is mildly dilated, diameter: 37 mm (S).  - Left atrium: The atrium was normal in size.  - Right ventricle: Systolic function was normal.  - Pulmonary arteries: Systolic pressure was within the normal    range.   Impressions:   - Challenging image quality. __________   2D echo 02/11/2020: 1. Left ventricular ejection fraction, by estimation, is 55 to 60%. The  left ventricle has normal function. The left ventricle has no regional  wall motion abnormalities. Left ventricular diastolic parameters are  consistent with Grade I diastolic  dysfunction (impaired relaxation).   2. Right ventricular systolic function is normal. The right ventricular  size is not well visualized.   3. The mitral valve is grossly normal. No evidence of mitral valve  regurgitation.   4. The aortic valve was not well visualized. Aortic valve regurgitation  not assessed.   5. The inferior vena cava is normal in size with greater than 50%  respiratory variability, suggesting right atrial pressure of 3 mmHg. __________   Lexiscan  MPI 03/01/2020: There was no ST segment deviation noted during stress. No T wave inversion was noted during stress. The study is normal. This is a low risk study. The left ventricular ejection fraction is hyperdynamic (>65%). CT attenuation images show evidence of aortic calcifications but minimal coronary calcifications. __________   2D echo 03/27/2023: 1. Left ventricular ejection fraction, by estimation, is 60 to 65%. The  left ventricle has normal function. The left ventricle has no regional  wall motion abnormalities. Left ventricular diastolic parameters are  consistent with Grade I diastolic   dysfunction (impaired relaxation).   2. Right ventricular systolic function is normal. The right ventricular  size is not well  visualized.   3. The mitral valve is grossly normal. No evidence of mitral valve  regurgitation.   4. The aortic valve was not well visualized. Aortic valve regurgitation  is mild.   5. Aortic dilatation noted. There is severe dilatation of the ascending  aorta, measuring 58 mm. There is borderline dilatation of the aortic root,  measuring 38 mm.   Conclusion(s)/Recommendation(s): Severe ascending aorta dilation, 58 mm in  diameter. Urgent referal to CT surgery placed. CT angio chest and abdomen  to evaluate aorta also placed.  __________   CTA chest, abdomen, pelvis 04/14/2023: IMPRESSION: Vascular:   1. Ascending thoracic aortic aneurysm measuring up to 6.4 cm in maximal diameter. Recommend cardiothoracic/vascular surgery referral if not already obtained.  2. Abdominal aortic aneurysm measuring 3.9 cm. Recommend surveillance ultrasound in 3 years. Reference: Journal of Vascular Surgery 67.1 (2018): 2-77. J Am Coll Radiol 2013;10:789-794. 3.  Aortic Atherosclerosis (ICD10-I70.0).   Nonvascular:   1. Colonic diverticulosis without diverticulitis. 2. Cardiomegaly. __________   [facility] 04/25/2023: Conclusions: Mild to moderate, nonobstructive coronary artery disease with up to 40-50% mid LAD stenosis and 20% disease involving proximal segment of large ramus intermedius branch.  LCx and RCA are without significant disease. Upper normal left heart filling pressure (PCWP 17 mmHg). Mildly elevated right heart and pulmonary artery pressures (mean RA/RV EDP 10 mmHg, PA 40/15 mmHg, mean PA 23 mmHg). Normal Fick cardiac output/index (CO 5.7 L/min, CI 2.9 L/min/m). Tortuous right radial artery precluding catheterization from this approach.  Query vascular malformation versus IV fistula from remote IV placement in the antecubital fossa based on sheath angiogram  and physical exam.  Alternative access recommended for future catheterizations if necessary.   Recommendations: Continue secondary prevention of ASCVD. Proceed with cardiac surgery evaluation for further management of severely dilated ascending aorta. Consider nonemergent duplex of right radial artery/antecubital fossa. __________  CTA chest/abdomen/pelvis 11/08/2023: IMPRESSION: Thoracoabdominal aortic aneurysm. Maximum aortic diameter in the ascending thoracic aorta measures 6.3 cm. Maximum diameter in the upper abdomen measures 4 cm at the aortic hiatus. No significant change since prior study. No dissection.   Aortic aneurysm    Cardiomegaly, coronary artery disease.   No acute cardiopulmonary disease.   No acute findings in the abdomen or pelvis.   Left colonic diverticulosis.  No active diverticulitis.    EKG:  EKG is ordered today.  The EKG ordered today demonstrates NSR, 87 bpm, left axis deviation, first-degree AV block LVH, poor R wave progression along the precordial leads, baseline artifact, nonspecific ST-T changes  Recent Labs: 04/17/2023: Platelets 143 04/25/2023: Hemoglobin 13.9 09/17/2023: ALT 11; BUN 18; Creatinine, Ser 0.92; Potassium 4.2; Sodium 138  Recent Lipid Panel    Component Value Date/Time   CHOL 109 09/17/2023 1203   TRIG 81 09/17/2023 1203   HDL 53 09/17/2023 1203   CHOLHDL 2.1 09/17/2023 1203   CHOLHDL 3.8 02/19/2017 1445   LDLCALC 40 09/17/2023 1203   LDLCALC 109 (H) 02/19/2017 1445   LDLDIRECT 104 (H) 04/22/2020 1411    PHYSICAL EXAM:    VS:  BP (!) 100/50 (BP Location: Left Arm, Patient Position: Sitting, Cuff Size: Normal)   Pulse 87   Ht 5' 3 (1.6 m)   Wt 209 lb (94.8 kg)   SpO2 92%   BMI 37.02 kg/m   BMI: Body mass index is 37.02 kg/m.  Physical Exam Vitals reviewed.  Constitutional:      Appearance: She is well-developed.  HENT:     Head: Normocephalic and atraumatic.  Eyes:     General:        Right eye: No  discharge.        Left eye: No discharge.  Cardiovascular:     Rate and Rhythm: Normal rate and regular rhythm.     Heart sounds: Normal heart sounds, S1 normal and S2 normal. Heart sounds not distant. No midsystolic click and no opening snap. No murmur heard.    No friction rub.  Pulmonary:     Effort: Pulmonary effort is normal. No respiratory distress.     Breath sounds: Normal breath sounds. No decreased breath sounds, wheezing, rhonchi or rales.  Musculoskeletal:     Cervical back: Normal range of motion.  Skin:    General: Skin is warm and dry.     Nails: There is no clubbing.  Neurological:     Mental Status: She is alert and oriented to person, place, and time.  Psychiatric:        Speech: Speech normal.        Behavior: Behavior normal.        Thought Content: Thought content normal.        Judgment: Judgment normal.     Wt Readings from Last 3 Encounters:  12/18/23 209 lb (94.8 kg)  09/17/23 207 lb (93.9 kg)  05/18/23 206 lb 12.8 oz (93.8 kg)     ASSESSMENT & PLAN:   Severe ascending thoracic aortic aneurysm: Noted on echo in 02/2023 measuring 5.8 cm. Follow-up CTA chest, abdomen, pelvis, showed ascending thoracic aortic aneurysm measuring 6.4 cm with the aorta still measuring 5 cm at the takeoff of the innominate artery and does not decrease to normal size until beyond the left subclavian artery. Upon review of echo and CTA images from Lexiscan  MPI in 2021, ascending thoracic aorta was measuring 5 to 5.5 cm at that time.  Evaluated by CVTS with concern regarding surgical risk in the context of magnitude of surgical repair required, age, deconditioning, and chronic shortness of breath and hypoxia.  PFTs demonstrated severe obstructive and restrictive defect with at least moderate decrease in diffusion capacity.  Not felt to be a surgical candidate with CVTS.  She has declined second surgical opinion and understands that should her aneurysm rupture this would be fatal.   Prefers to follow-up locally rather than travel to [place].  Repeat CTA chest last month showed stable ascending thoracic aortic region measuring 6.3 cm.  Repeat CTA chest, abdomen, pelvis in 05/2024.  Precautions discussed.  Nonobstructive CAD: No symptoms suggestive of angina or cardiac decompensation.  LHC in 03/2023 showed mild to moderate nonobstructive CAD as outlined above.  Continue aspirin  81 mg and rosuvastatin  10 mg.  No indication for further ischemic testing at this time.   HFpEF with chronic dyspnea: Without decompensation.  Recent PFTs concerning for obstructive and restrictive pulmonary disease.  Defer addition of SGLT2 inhibitor given concern for off target effect in the setting of body habitus.  Not currently taking furosemide , though is taking HCTZ 25 mg daily.  Regarding pulmonary disease, ongoing management per PCP.   AAA: Stable, measuring 4.0 cm by CTA in 10/2023.  Follow-up imaging in 05/2024.  HTN: Blood pressure is on the low side, though stable and largely asymptomatic.  Prefer to run blood pressure on the low side given the above.  She remains on amlodipine  5 mg, HCTZ 25 mg, and losartan 50 mg.  HLD: LDL 40 in 08/2023 with normal AST/ALT at that time.  Remains on rosuvastatin  10 mg.  Disposition: F/u with Dr. Gollan or an APP in 6 months.   Medication Adjustments/Labs and Tests Ordered: Current medicines are reviewed at length with the patient today.  Concerns regarding medicines are outlined above. Medication changes, Labs and Tests ordered today are summarized above and listed in the Patient Instructions accessible in Encounters.   Signed, Bernardino Bring, PA-C 12/18/2023 12:56 PM     [facility] [place] [address] [place], [place] [address] [phone]

## 2024-05-26 ENCOUNTER — Ambulatory Visit

## 2024-06-16 ENCOUNTER — Ambulatory Visit: Admitting: Physician Assistant
